# Patient Record
Sex: Male | Born: 1945 | Race: White | Hispanic: No | Marital: Married | Smoking: Former smoker
Health system: Southern US, Community
[De-identification: ages and names within clinical notes are randomized; demographics above are authoritative.]

## PROBLEM LIST (undated history)

## (undated) DIAGNOSIS — R55 Syncope and collapse: Secondary | ICD-10-CM

## (undated) DIAGNOSIS — E119 Type 2 diabetes mellitus without complications: Secondary | ICD-10-CM

## (undated) DIAGNOSIS — I10 Essential (primary) hypertension: Secondary | ICD-10-CM

## (undated) DIAGNOSIS — H269 Unspecified cataract: Secondary | ICD-10-CM

## (undated) DIAGNOSIS — T7840XA Allergy, unspecified, initial encounter: Secondary | ICD-10-CM

## (undated) DIAGNOSIS — I2510 Atherosclerotic heart disease of native coronary artery without angina pectoris: Secondary | ICD-10-CM

## (undated) DIAGNOSIS — Z972 Presence of dental prosthetic device (complete) (partial): Secondary | ICD-10-CM

## (undated) DIAGNOSIS — E785 Hyperlipidemia, unspecified: Secondary | ICD-10-CM

## (undated) HISTORY — PX: CARDIAC CATHETERIZATION: SHX172

## (undated) HISTORY — PX: APPENDECTOMY: SHX54

## (undated) HISTORY — PX: CATARACT EXTRACTION, BILATERAL: SHX1313

## (undated) HISTORY — DX: Unspecified cataract: H26.9

## (undated) HISTORY — DX: Allergy, unspecified, initial encounter: T78.40XA

## (undated) HISTORY — DX: Type 2 diabetes mellitus without complications: E11.9

## (undated) HISTORY — PX: MOHS SURGERY: SUR867

## (undated) HISTORY — DX: Hyperlipidemia, unspecified: E78.5

---

## 2016-09-07 ENCOUNTER — Ambulatory Visit: Payer: Self-pay | Admitting: Unknown Physician Specialty

## 2016-10-19 ENCOUNTER — Encounter: Payer: Self-pay | Admitting: Unknown Physician Specialty

## 2016-10-19 ENCOUNTER — Ambulatory Visit (INDEPENDENT_AMBULATORY_CARE_PROVIDER_SITE_OTHER): Payer: Medicare Other | Admitting: Unknown Physician Specialty

## 2016-10-19 VITALS — BP 130/64 | HR 79 | Temp 98.0°F | Ht 73.0 in | Wt 169.8 lb

## 2016-10-19 DIAGNOSIS — E1169 Type 2 diabetes mellitus with other specified complication: Secondary | ICD-10-CM | POA: Insufficient documentation

## 2016-10-19 DIAGNOSIS — Z7689 Persons encountering health services in other specified circumstances: Secondary | ICD-10-CM

## 2016-10-19 DIAGNOSIS — Z794 Long term (current) use of insulin: Secondary | ICD-10-CM

## 2016-10-19 DIAGNOSIS — I10 Essential (primary) hypertension: Secondary | ICD-10-CM | POA: Diagnosis not present

## 2016-10-19 DIAGNOSIS — E119 Type 2 diabetes mellitus without complications: Secondary | ICD-10-CM

## 2016-10-19 DIAGNOSIS — E1159 Type 2 diabetes mellitus with other circulatory complications: Secondary | ICD-10-CM | POA: Insufficient documentation

## 2016-10-19 DIAGNOSIS — E1165 Type 2 diabetes mellitus with hyperglycemia: Secondary | ICD-10-CM | POA: Insufficient documentation

## 2016-10-19 DIAGNOSIS — I152 Hypertension secondary to endocrine disorders: Secondary | ICD-10-CM | POA: Insufficient documentation

## 2016-10-19 DIAGNOSIS — E7800 Pure hypercholesterolemia, unspecified: Secondary | ICD-10-CM | POA: Diagnosis not present

## 2016-10-19 LAB — LIPID PANEL PICCOLO, WAIVED
Chol/HDL Ratio Piccolo,Waive: 1.8 mg/dL
Cholesterol Piccolo, Waived: 163 mg/dL (ref <200)
HDL CHOL PICCOLO, WAIVED: 90 mg/dL (ref >59)
LDL CHOL CALC PICCOLO WAIVED: 59 mg/dL (ref <100)
TRIGLYCERIDES PICCOLO,WAIVED: 70 mg/dL (ref <150)
VLDL CHOL CALC PICCOLO,WAIVE: 14 mg/dL (ref <30)

## 2016-10-19 LAB — BAYER DCA HB A1C WAIVED: HB A1C (BAYER DCA - WAIVED): 7 % — ABNORMAL HIGH (ref <7.0)

## 2016-10-19 LAB — MICROALBUMIN, URINE WAIVED
CREATININE, URINE WAIVED: 200 mg/dL (ref 10–300)
Microalb, Ur Waived: 80 mg/L — ABNORMAL HIGH (ref 0–19)

## 2016-10-19 MED ORDER — ACCU-CHEK SOFT TOUCH LANCETS MISC: 12 refills | Status: DC

## 2016-10-19 MED ORDER — SIMVASTATIN 80 MG PO TABS: 40.0000 mg | ORAL_TABLET | Freq: Every day | ORAL | 1 refills | Status: DC

## 2016-10-19 MED ORDER — PIOGLITAZONE HCL 30 MG PO TABS: 30.0000 mg | ORAL_TABLET | Freq: Every day | ORAL | 1 refills | Status: DC

## 2016-10-19 MED ORDER — INSULIN LISPRO PROT & LISPRO (50-50 MIX) 100 UNIT/ML KWIKPEN: 6.0000 [IU] | PEN_INJECTOR | Freq: Two times a day (BID) | SUBCUTANEOUS | 12 refills | Status: DC

## 2016-10-19 NOTE — Assessment & Plan Note (Signed)
LDL 59.  Continue current medications

## 2016-10-19 NOTE — Assessment & Plan Note (Addendum)
Hgb A1C is 7.0%

## 2016-10-19 NOTE — Progress Notes (Signed)
BP 130/64   Pulse 79   Temp 98 F (36.7 C)   Ht 6\' 1"  (1.854 m)   Wt 169 lb 12.8 oz (77 kg)   SpO2 94%   BMI 22.40 kg/m    Subjective:    Patient ID: Johnny Wolfe, male    DOB: 05-Mar-1946, 71 y.o.   MRN: [id number]  HPI: Johnny Wolfe is a 71 y.o. male  Chief Complaint  Patient presents with  . Establish Care  . Diabetes   Diabetes: Taking 6 u of 50/50 twice a day.   Using medications without difficulties No hypoglycemic episodes No hyperglycemic episodes Feet problems: none Blood Sugars averaging: checks occasionally.  Sugar was 60 units at night eye exam within last year after cataract surgery Last Hgb A1C: usually 7-8   Hypertension  Using medications without difficulty Average home BPs typically 130/70-60   Using medication without problems or lightheadedness No chest pain with exertion or shortness of breath No Edema  Elevated Cholesterol Using medications without problems No Muscle aches  Diet:Exercise:  Social History   Social History  . Marital status: Married    Spouse name: N/A  . Number of children: N/A  . Years of education: N/A   Occupational History  . Not on file.   Social History Main Topics  . Smoking status: Former Smoker    Quit date: 10/27/2011  . Smokeless tobacco: Never Used  . Alcohol use 4.8 - 8.4 oz/week    2 - 4 Glasses of wine, 6 - 10 Shots of liquor per week  . Drug use: No  . Sexual activity: No   Other Topics Concern  . Not on file   Social History Narrative  . No narrative on file   Family History  Problem Relation Age of Onset  . Diabetes Mother   . Cancer Father        lung   Past Medical History:  Diagnosis Date  . Cataract   . Diabetes mellitus without complication ([place])   . Hyperlipidemia    Past Surgical History:  Procedure Laterality Date  . APPENDECTOMY    . CATARACT EXTRACTION, BILATERAL     August 2017    Relevant past medical, surgical, family and social history reviewed and updated as  indicated. Interim medical history since our last visit reviewed. Allergies and medications reviewed and updated.  Review of Systems  Constitutional: Negative.   HENT: Negative.   Eyes: Negative.   Respiratory: Negative.   Cardiovascular: Negative.   Gastrointestinal: Negative.   Endocrine: Negative.   Genitourinary: Negative.   Skin: Negative.   Allergic/Immunologic: Negative.   Neurological: Negative.   Hematological: Negative.   Psychiatric/Behavioral: Negative.     Per HPI unless specifically indicated above     Objective:    BP 130/64   Pulse 79   Temp 98 F (36.7 C)   Ht 6\' 1"  (1.854 m)   Wt 169 lb 12.8 oz (77 kg)   SpO2 94%   BMI 22.40 kg/m   Wt Readings from Last 3 Encounters:  10/19/16 169 lb 12.8 oz (77 kg)    Physical Exam  Constitutional: He is oriented to person, place, and time. He appears well-developed and well-nourished. No distress.  HENT:  Head: Normocephalic and atraumatic.  Eyes: Conjunctivae and lids are normal. Right eye exhibits no discharge. Left eye exhibits no discharge. No scleral icterus.  Neck: Normal range of motion. Neck supple. No JVD present. Carotid bruit is not present.  Cardiovascular:  Normal rate, regular rhythm and normal heart sounds.   Pulmonary/Chest: Effort normal and breath sounds normal. No respiratory distress.  Abdominal: Normal appearance. There is no splenomegaly or hepatomegaly.  Musculoskeletal: Normal range of motion.  Neurological: He is alert and oriented to person, place, and time.  Skin: Skin is warm, dry and intact. No rash noted. No pallor.  Psychiatric: He has a normal mood and affect. His behavior is normal. Judgment and thought content normal.   Diabetic Foot Exam - Simple   Simple Foot Form Diabetic Foot exam was performed with the following findings:  Yes 10/19/2016  3:49 PM  Visual Inspection No deformities, no ulcerations, no other skin breakdown bilaterally:  Yes Sensation Testing Intact to touch  and monofilament testing bilaterally:  Yes Pulse Check Posterior Tibialis and Dorsalis pulse intact bilaterally:  Yes Comments      No results found for this or any previous visit.    Assessment & Plan:   Problem List Items Addressed This Visit      Unprioritized   Essential hypertension, benign - Primary    Much improved on recheck.  Start Lisinopril 5 mg due to positive microalbumin      Relevant Medications   simvastatin (ZOCOR) 80 MG tablet   Other Relevant Orders   Comprehensive metabolic panel   High cholesterol    LDL 59.  Continue current medications      Relevant Medications   simvastatin (ZOCOR) 80 MG tablet   Other Relevant Orders   Lipid Panel Piccolo, Waived   Type 2 diabetes mellitus without complication (HCC)    Hgb A1C is 7.0%      Relevant Medications   Saxagliptin-Metformin (KOMBIGLYZE XR) 2.08-998 MG TB24   Insulin Lispro Prot & Lispro (HUMALOG 50/50 MIX) (50-50) 100 UNIT/ML Kwikpen   pioglitazone (ACTOS) 30 MG tablet   simvastatin (ZOCOR) 80 MG tablet   Other Relevant Orders   Bayer DCA Hb A1c Waived   Microalbumin, Urine Waived   Comprehensive metabolic panel    Other Visit Diagnoses    Encounter to establish care           Follow up plan: Return in about 3 months (around 01/19/2017).

## 2016-10-19 NOTE — Assessment & Plan Note (Addendum)
Much improved on recheck.  Start Lisinopril 5 mg due to positive microalbumin

## 2016-10-20 LAB — COMPREHENSIVE METABOLIC PANEL
ALK PHOS: 90 IU/L (ref 39–117)
ALT: 7 IU/L (ref 0–44)
AST: 15 IU/L (ref 0–40)
Albumin/Globulin Ratio: 1.8 (ref 1.2–2.2)
Albumin: 4.3 g/dL (ref 3.6–4.8)
BUN/Creatinine Ratio: 18 (ref 10–24)
BUN: 15 mg/dL (ref 8–27)
Bilirubin Total: 0.6 mg/dL (ref 0.0–1.2)
CO2: 24 mmol/L (ref 20–29)
CREATININE: 0.83 mg/dL (ref 0.76–1.27)
Calcium: 9.7 mg/dL (ref 8.6–10.2)
Chloride: 103 mmol/L (ref 96–106)
GFR calc Af Amer: 105 mL/min/{1.73_m2} (ref >59)
GFR calc non Af Amer: 90 mL/min/{1.73_m2} (ref >59)
GLUCOSE: 210 mg/dL — AB (ref 65–99)
Globulin, Total: 2.4 g/dL (ref 1.5–4.5)
Potassium: 4.6 mmol/L (ref 3.5–5.2)
Sodium: 141 mmol/L (ref 134–144)
Total Protein: 6.7 g/dL (ref 6.0–8.5)

## 2016-10-22 ENCOUNTER — Other Ambulatory Visit: Payer: Self-pay | Admitting: Unknown Physician Specialty

## 2016-10-22 MED ORDER — SAXAGLIPTIN-METFORMIN ER 2.5-1000 MG PO TB24: 2.0000 | ORAL_TABLET | Freq: Every day | ORAL | 1 refills | Status: DC

## 2016-10-22 NOTE — Telephone Encounter (Signed)
Routing to provider  

## 2016-10-22 NOTE — Telephone Encounter (Signed)
Patient says all of his medications came through the pharmacy except for the [facility]. Patient would like for Kombligyze to be sent to [facility] again due to an issue with a change in his insurance.   Please Advise.  Thank you

## 2016-10-30 ENCOUNTER — Encounter: Payer: Self-pay | Admitting: Unknown Physician Specialty

## 2016-10-30 ENCOUNTER — Ambulatory Visit (INDEPENDENT_AMBULATORY_CARE_PROVIDER_SITE_OTHER): Payer: Medicare Other | Admitting: Unknown Physician Specialty

## 2016-10-30 VITALS — BP 113/67 | HR 85 | Temp 97.8°F | Ht 73.0 in | Wt 164.2 lb

## 2016-10-30 DIAGNOSIS — M25562 Pain in left knee: Secondary | ICD-10-CM

## 2016-10-30 DIAGNOSIS — Z0000 Encounter for general adult medical examination without abnormal findings: Secondary | ICD-10-CM | POA: Diagnosis not present

## 2016-10-30 MED ORDER — LISINOPRIL 5 MG PO TABS: 5.0000 mg | ORAL_TABLET | Freq: Every day | ORAL | 3 refills | Status: DC

## 2016-10-30 MED ORDER — INSULIN PEN NEEDLE 31G X 4 MM MISC: 1.0000 [IU] | Freq: Two times a day (BID) | 12 refills | Status: DC

## 2016-10-30 NOTE — Progress Notes (Addendum)
BP 113/67   Pulse 85   Temp 97.8 F (36.6 C)   Ht 6\' 1"  (1.854 m)   Wt 164 lb 3.2 oz (74.5 kg)   SpO2 95%   BMI 21.66 kg/m    Subjective:    Patient ID: Johnny Wolfe, male    DOB: 1945-12-07, 71 y.o.   MRN: [id number]  HPI: Johnny Wolfe is a 71 y.o. male  Chief Complaint  Patient presents with  . Medicare Wellness  . Knee Pain    sudden onset left knee pain on 10/25/16, states it is still a little sore    Knee pain Pt with sudden onset of his left knee and difficulty walking.  This resolved after hyper-flexing.    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 ) Does the patient have difficulty seeing, even when wearing glasses/contacts?: No Does the patient have difficulty concentrating, remembering, or making decisions?: No Does the patient have difficulty walking or climbing stairs?: No Does the patient have difficulty dressing or bathing?: No Does the patient have difficulty doing errands alone such as visiting a doctor's office or shopping?: No  Depression screen [facility] 2/9 10/30/2016 10/19/2016  Decreased Interest 0 0  Down, Depressed, Hopeless 0 0  PHQ - 2 Score 0 0  Altered sleeping 0 -  Tired, decreased energy 1 -  Change in appetite 1 -  Feeling bad or failure about yourself  0 -  Trouble concentrating 0 -  Moving slowly or fidgety/restless 0 -  Suicidal thoughts 0 -  PHQ-9 Score 2 -   Fall Risk  10/30/2016  Falls in the past year? No    Social History   Social History  . Marital status: Married    Spouse name: N/A  . Number of children: N/A  . Years of education: N/A   Occupational History  . Not on file.   Social History Main Topics  . Smoking status: Former Smoker    Quit date: 10/27/2011  . Smokeless tobacco: Never Used  . Alcohol use 4.8 - 8.4 oz/week    2 - 4 Glasses of wine, 6 - 10 Shots of liquor per week  . Drug use: No  . Sexual activity: No   Other Topics Concern  . Not on file   Social  History Narrative  . No narrative on file   Past Surgical History:  Procedure Laterality Date  . APPENDECTOMY    . CATARACT EXTRACTION, BILATERAL     August 2017   Past Medical History:  Diagnosis Date  . Cataract   . Diabetes mellitus without complication ([place])   . Hyperlipidemia     Relevant past medical, surgical, family and social history reviewed and updated as indicated. Interim medical history since our last visit reviewed. Allergies and medications reviewed and updated.  Review of Systems  Constitutional: Negative.   HENT: Negative.   Eyes: Negative.   Respiratory: Negative.   Cardiovascular: Negative.   Gastrointestinal: Negative.        Poor appetite  Endocrine: Negative.   Genitourinary: Negative.   Skin: Negative.   Allergic/Immunologic: Negative.   Neurological: Negative.   Hematological: Negative.   Psychiatric/Behavioral: Negative.     Per HPI unless specifically indicated above     Objective:    BP 113/67   Pulse 85   Temp 97.8 F (36.6 C)   Ht 6\' 1"  (1.854 m)   Wt 164 lb 3.2 oz (74.5 kg)  SpO2 95%   BMI 21.66 kg/m   Wt Readings from Last 3 Encounters:  10/30/16 164 lb 3.2 oz (74.5 kg)  10/19/16 169 lb 12.8 oz (77 kg)    Physical Exam  Constitutional: He is oriented to person, place, and time. He appears well-developed and well-nourished.  HENT:  Head: Normocephalic.  Right Ear: Tympanic membrane, external ear and ear canal normal.  Left Ear: Tympanic membrane, external ear and ear canal normal.  Mouth/Throat: Uvula is midline, oropharynx is clear and moist and mucous membranes are normal.  Eyes: Pupils are equal, round, and reactive to light.  Cardiovascular: Normal rate, regular rhythm and normal heart sounds.  Exam reveals no gallop and no friction rub.   No murmur heard. Pulmonary/Chest: Effort normal and breath sounds normal. No respiratory distress.  Abdominal: Soft. Bowel sounds are normal. He exhibits no distension. There is no  tenderness.  Musculoskeletal: Normal range of motion.  Neurological: He is alert and oriented to person, place, and time. He has normal reflexes.  Skin: Skin is warm and dry.  Psychiatric: He has a normal mood and affect. His behavior is normal. Judgment and thought content normal.   EKG Normal sinus rhythm.  IRBBB.  No acute changes  Results for orders placed or performed in visit on 10/19/16  Bayer DCA Hb A1c Waived  Result Value Ref Range   Bayer DCA Hb A1c Waived 7.0 (H) <7.0 %  Lipid Panel Piccolo, Waived  Result Value Ref Range   Cholesterol Piccolo, Waived 163 <200 mg/dL   HDL Chol Piccolo, Waived 90 >59 mg/dL   Triglycerides Piccolo,Waived 70 <150 mg/dL   Chol/HDL Ratio Piccolo,Waive 1.8 mg/dL   LDL Chol Calc Piccolo Waived 59 <100 mg/dL   VLDL Chol Calc Piccolo,Waive 14 <30 mg/dL  Microalbumin, Urine Waived  Result Value Ref Range   Microalb, Ur Waived 80 (H) 0 - 19 mg/L   Creatinine, Urine Waived 200 10 - 300 mg/dL   Microalb/Creat Ratio 30-300 (H) <30 mg/g  Comprehensive metabolic panel  Result Value Ref Range   Glucose 210 (H) 65 - 99 mg/dL   BUN 15 8 - 27 mg/dL   Creatinine, Ser 0.83 0.76 - 1.27 mg/dL   GFR calc non Af Amer 90 >59 mL/min/1.73   GFR calc Af Amer 105 >59 mL/min/1.73   BUN/Creatinine Ratio 18 10 - 24   Sodium 141 134 - 144 mmol/L   Potassium 4.6 3.5 - 5.2 mmol/L   Chloride 103 96 - 106 mmol/L   CO2 24 20 - 29 mmol/L   Calcium 9.7 8.6 - 10.2 mg/dL   Total Protein 6.7 6.0 - 8.5 g/dL   Albumin 4.3 3.6 - 4.8 g/dL   Globulin, Total 2.4 1.5 - 4.5 g/dL   Albumin/Globulin Ratio 1.8 1.2 - 2.2   Bilirubin Total 0.6 0.0 - 1.2 mg/dL   Alkaline Phosphatase 90 39 - 117 IU/L   AST 15 0 - 40 IU/L   ALT 7 0 - 44 IU/L      Assessment & Plan:   Problem List Items Addressed This Visit    None    Visit Diagnoses    Initial Medicare annual wellness visit    -  Primary   Relevant Orders   EKG 12-Lead (Completed)   Acute pain of left knee       this seems  to be resolved.  Continue to monitor       Follow up plan: No Follow-up on file.

## 2016-12-10 ENCOUNTER — Other Ambulatory Visit: Payer: Self-pay | Admitting: Unknown Physician Specialty

## 2017-01-10 ENCOUNTER — Telehealth: Payer: Self-pay | Admitting: Unknown Physician Specialty

## 2017-01-10 NOTE — Telephone Encounter (Signed)
Did not receive a fax from [facility] today and not sure what medications need to be sent in because according to chart all medications should have refills on them. Called and left patient a CM asking for him to please return my call.

## 2017-01-10 NOTE — Telephone Encounter (Signed)
Patient wanted to let Malachy Mood know he will be out of the country on a business trip so he had to cancel his appt for 01-21-17. He said [facility] would be sending a request for some of his medications that he will be out of before he returns some time in November.  Thank You

## 2017-01-15 NOTE — Telephone Encounter (Signed)
Called the home number listed and a lady answered who states that she has been divorced from this person and needs this number taken off of his record. Tried calling the other number listed. It went straight to VM so I left a message asking for patient to please give me a call back when he could to discuss this.

## 2017-01-18 ENCOUNTER — Telehealth: Payer: Self-pay | Admitting: Unknown Physician Specialty

## 2017-01-18 NOTE — Telephone Encounter (Signed)
RX faxed to [facility].  

## 2017-01-18 NOTE — Telephone Encounter (Signed)
Called and left patient another VM. Explained that we have not received refill requests so I am not sure which medications he is needing. Looks like all are not due for refills yet. Asked for patient to please give [place] a call if he needs any refills.

## 2017-01-18 NOTE — Telephone Encounter (Signed)
Called and spoke to the pharmacist at [facility]. She states that they need a new RX for the patient's test strips. I told her that I would be faxing this in because I would have to get the provider's approval. Confirmed fax number with the pharmacist. Will fill out form, have Malachy Mood approve, and fax to [place].

## 2017-01-18 NOTE — Telephone Encounter (Signed)
[facility] pharmacy requesting pre-authorization for one touch ultra blue test strips.  Please Advise.  Thank you  Phone number: [phone] Reference number: [id number]

## 2017-01-21 ENCOUNTER — Ambulatory Visit: Payer: Medicare Other | Admitting: Unknown Physician Specialty

## 2017-02-28 ENCOUNTER — Other Ambulatory Visit: Payer: Self-pay | Admitting: Unknown Physician Specialty

## 2017-03-14 DIAGNOSIS — H43813 Vitreous degeneration, bilateral: Secondary | ICD-10-CM | POA: Diagnosis not present

## 2017-03-14 LAB — HM DIABETES EYE EXAM

## 2017-06-10 ENCOUNTER — Ambulatory Visit: Payer: Medicare Other | Admitting: Unknown Physician Specialty

## 2017-06-10 ENCOUNTER — Encounter: Payer: Self-pay | Admitting: Unknown Physician Specialty

## 2017-06-10 VITALS — BP 148/72 | HR 82 | Temp 97.9°F | Wt 171.6 lb

## 2017-06-10 DIAGNOSIS — E119 Type 2 diabetes mellitus without complications: Secondary | ICD-10-CM | POA: Diagnosis not present

## 2017-06-10 DIAGNOSIS — E7800 Pure hypercholesterolemia, unspecified: Secondary | ICD-10-CM

## 2017-06-10 DIAGNOSIS — R55 Syncope and collapse: Secondary | ICD-10-CM

## 2017-06-10 DIAGNOSIS — I10 Essential (primary) hypertension: Secondary | ICD-10-CM | POA: Diagnosis not present

## 2017-06-10 DIAGNOSIS — Z1159 Encounter for screening for other viral diseases: Secondary | ICD-10-CM | POA: Diagnosis not present

## 2017-06-10 DIAGNOSIS — Z794 Long term (current) use of insulin: Secondary | ICD-10-CM | POA: Diagnosis not present

## 2017-06-10 DIAGNOSIS — Z23 Encounter for immunization: Secondary | ICD-10-CM

## 2017-06-10 DIAGNOSIS — Q181 Preauricular sinus and cyst: Secondary | ICD-10-CM | POA: Diagnosis not present

## 2017-06-10 LAB — BAYER DCA HB A1C WAIVED: HB A1C (BAYER DCA - WAIVED): 6.3 % (ref <7.0)

## 2017-06-10 NOTE — Assessment & Plan Note (Signed)
BP high here but low at home.  Will continue to monitor

## 2017-06-10 NOTE — Patient Instructions (Addendum)
Pneumococcal Polysaccharide Vaccine: What You Need to Know 1. Why get vaccinated? Vaccination can protect older adults (and some children and younger adults) from pneumococcal disease. Pneumococcal disease is caused by bacteria that can spread from person to person through close contact. It can cause ear infections, and it can also lead to more serious infections of the:  Lungs (pneumonia),  Blood (bacteremia), and  Covering of the brain and spinal cord (meningitis). Meningitis can cause deafness and brain damage, and it can be fatal.  Anyone can get pneumococcal disease, but children under 52 years of age, people with certain medical conditions, adults over 104 years of age, and cigarette smokers are at the highest risk. About 18,000 older adults die each year from pneumococcal disease in the [place]. Treatment of pneumococcal infections with penicillin and other drugs used to be more effective. But some strains of the disease have become resistant to these drugs. This makes prevention of the disease, through vaccination, even more important. 2. Pneumococcal polysaccharide vaccine (PPSV23) Pneumococcal polysaccharide vaccine (PPSV23) protects against 23 types of pneumococcal bacteria. It will not prevent all pneumococcal disease. PPSV23 is recommended for:  All adults 31 years of age and older,  Anyone 2 through 70 years of age with certain long-term health problems,  Anyone 2 through 70 years of age with a weakened immune system,  Adults 35 through 70 years of age who smoke cigarettes or have asthma.  Most people need only one dose of PPSV. A second dose is recommended for certain high-risk groups. People 73 and older should get a dose even if they have gotten one or more doses of the vaccine before they turned 65. Your healthcare provider can give you more information about these recommendations. Most healthy adults develop protection within 2 to 3 weeks of getting the shot. 3.  Some people should not get this vaccine  Anyone who has had a life-threatening allergic reaction to PPSV should not get another dose.  Anyone who has a severe allergy to any component of PPSV should not receive it. Tell your provider if you have any severe allergies.  Anyone who is moderately or severely ill when the shot is scheduled may be asked to wait until they recover before getting the vaccine. Someone with a mild illness can usually be vaccinated.  Children less than 64 years of age should not receive this vaccine.  There is no evidence that PPSV is harmful to either a pregnant woman or to her fetus. However, as a precaution, women who need the vaccine should be vaccinated before becoming pregnant, if possible. 4. Risks of a vaccine reaction With any medicine, including vaccines, there is a chance of side effects. These are usually mild and go away on their own, but serious reactions are also possible. About half of people who get PPSV have mild side effects, such as redness or pain where the shot is given, which go away within about two days. Less than 1 out of 100 people develop a fever, muscle aches, or more severe local reactions. Problems that could happen after any vaccine:  People sometimes faint after a medical procedure, including vaccination. Sitting or lying down for about 15 minutes can help prevent fainting, and injuries caused by a fall. Tell your doctor if you feel dizzy, or have vision changes or ringing in the ears.  Some people get severe pain in the shoulder and have difficulty moving the arm where a shot was given. This happens very rarely.  Any  medication can cause a severe allergic reaction. Such reactions from a vaccine are very rare, estimated at about 1 in a million doses, and would happen within a few minutes to a few hours after the vaccination. As with any medicine, there is a very remote chance of a vaccine causing a serious injury or death. The safety of  vaccines is always being monitored. For more information, visit: http://www.aguilar.org/ 5. What if there is a serious reaction? What should I look for? Look for anything that concerns you, such as signs of a severe allergic reaction, very high fever, or unusual behavior. Signs of a severe allergic reaction can include hives, swelling of the face and throat, difficulty breathing, a fast heartbeat, dizziness, and weakness. These would usually start a few minutes to a few hours after the vaccination. What should I do? If you think it is a severe allergic reaction or other emergency that can't wait, call 9-1-1 or get to the nearest hospital. Otherwise, call your doctor. Afterward, the reaction should be reported to the Vaccine Adverse Event Reporting System (VAERS). Your doctor might file this report, or you can do it yourself through the VAERS web site at www.vaers.SamedayNews.es, or by calling 854-166-3662. VAERS does not give medical advice. 6. How can I learn more?  Ask your doctor. He or she can give you the vaccine package insert or suggest other sources of information.  Call your local or state health department.  Contact the Centers for Disease Control and Prevention (CDC): ? Call 8073425804 (1-800-CDC-INFO) or ? Visit CDC's website at http://hunter.com/ CDC Pneumococcal Polysaccharide Vaccine VIS (08/21/13) This information is not intended to replace advice given to you by your health care provider. Make sure you discuss any questions you have with your health care provider. Document Released: 02/11/2006 Document Revised: 01/05/2016 Document Reviewed: 01/05/2016 Elsevier Interactive Patient Education  2017 [place] your long acting insulin on your fasting (usually in the morning) blood sugar.  Increase long acting (daily insulin) 1 units if fasting blood sugar is greater than 130.  Decrease by 2 units if fasting blood sugar is less than 95.

## 2017-06-10 NOTE — Progress Notes (Signed)
BP (!) 148/72   Pulse 82   Temp 97.9 F (36.6 C) (Oral)   Wt 171 lb 9.6 oz (77.8 kg)   SpO2 98%   BMI 22.64 kg/m    Subjective:    Patient ID: Johnny Wolfe, male    DOB: [birth year], 72 y.o.   MRN: [id number]  HPI: Johnny Wolfe is a 72 y.o. male  Chief Complaint  Patient presents with  . Diabetes  . Hyperlipidemia  . Hypertension   Diabetes: Using medications without difficulties: Taking Insulin 50/50 TID 8-8-5 States blood sugar "all over the place."  Normally between 130-150 which is higher.  Blood sugar in the AM 117-174.   Feet problems:none Blood Sugars averaging: eye exam within last year Last Hgb A1C: 7.0  Hypertension  Using medications without difficulty Average home BPs 94-109/58-67  Using medication without problems or lightheadedness No chest pain with exertion or shortness of breath No Edema  Syncope States one day he passed out twice.  States Blood sugar at the time was 69.  Not sure what BP was at this time.  This was early in the AM.    Elevated Cholesterol Using medications without problems No Muscle aches  Diet: Exercise:  Relevant past medical, surgical, family and social history reviewed and updated as indicated. Interim medical history since our last visit reviewed. Allergies and medications reviewed and updated.  Review of Systems  HENT:       Left ear cysts    Per HPI unless specifically indicated above     Objective:    BP (!) 148/72   Pulse 82   Temp 97.9 F (36.6 C) (Oral)   Wt 171 lb 9.6 oz (77.8 kg)   SpO2 98%   BMI 22.64 kg/m   Wt Readings from Last 3 Encounters:  06/10/17 171 lb 9.6 oz (77.8 kg)  10/30/16 164 lb 3.2 oz (74.5 kg)  10/19/16 169 lb 12.8 oz (77 kg)    Physical Exam  Constitutional: He is oriented to person, place, and time. He appears well-developed and well-nourished. No distress.  HENT:  Head: Normocephalic and atraumatic.  Eyes: Conjunctivae and lids are normal. Right eye exhibits no discharge.  Left eye exhibits no discharge. No scleral icterus.  Neck: Normal range of motion. Neck supple. No JVD present. Carotid bruit is not present.  Cardiovascular: Normal rate, regular rhythm and normal heart sounds.  Pulmonary/Chest: Effort normal and breath sounds normal. No respiratory distress.  Abdominal: Normal appearance. There is no splenomegaly or hepatomegaly.  Musculoskeletal: Normal range of motion.  Neurological: He is alert and oriented to person, place, and time.  Skin: Skin is warm, dry and intact. No rash noted. No pallor.  Psychiatric: He has a normal mood and affect. His behavior is normal. Judgment and thought content normal.   EKG RBBB.  NSR.  No acute change Results for orders placed or performed in visit on 06/10/17  Bayer DCA Hb A1c Waived  Result Value Ref Range   Bayer DCA Hb A1c Waived 6.3 <7.0 %      Assessment & Plan:   Problem List Items Addressed This Visit      Unprioritized   Ear cysts   Relevant Orders   Ambulatory referral to ENT   Essential hypertension, benign    BP high here but low at home.  Will continue to monitor      Relevant Orders   Comprehensive metabolic panel   High cholesterol   Relevant Orders   Lipid  Panel w/o Chol/HDL Ratio   Type 2 diabetes mellitus without complication (HCC)    Hgb A1C is 6.3%. However. Having wide swings.  DC 50/50 insulin.  Start Basaglar 10 u QHS. Written instructions to titrate long acting insulin on your fasting (usually in the morning) blood sugar.  Increase long acting (daily insulin) 1 units if fasting blood sugar is greater than 130.  Decrease by 2 units if fasting blood sugar is less than 95.    Take 4 units of Humalog 20 minutes before each meal      Relevant Orders   Bayer DCA Hb A1c Waived (Completed)    Other Visit Diagnoses    Need for pneumococcal vaccination    -  Primary   Relevant Orders   Pneumococcal polysaccharide vaccine 23-valent greater than or equal to 2yo subcutaneous/IM  (Completed)   Syncope, unspecified syncope type       EKG without acute changes.  Refer to cardiology as high risk patient   Relevant Orders   EKG 12-Lead (Completed)   Comprehensive metabolic panel   Ambulatory referral to Cardiology   Need for hepatitis C screening test       Relevant Orders   Hepatitis C antibody       Follow up plan: Return if symptoms worsen or fail to improve.

## 2017-06-10 NOTE — Assessment & Plan Note (Signed)
Hgb A1C is 6.3%. However. Having wide swings.  DC 50/50 insulin.  Start Basaglar 10 u QHS. Written instructions to titrate long acting insulin on your fasting (usually in the morning) blood sugar.  Increase long acting (daily insulin) 1 units if fasting blood sugar is greater than 130.  Decrease by 2 units if fasting blood sugar is less than 95.    Take 4 units of Humalog 20 minutes before each meal

## 2017-06-11 ENCOUNTER — Telehealth: Payer: Self-pay | Admitting: Unknown Physician Specialty

## 2017-06-11 ENCOUNTER — Other Ambulatory Visit
Admission: RE | Admit: 2017-06-11 | Discharge: 2017-06-11 | Disposition: A | Discharge status: Home / Self Care | Payer: Medicare Other | Source: Ambulatory Visit | Attending: Unknown Physician Specialty | Admitting: Unknown Physician Specialty

## 2017-06-11 DIAGNOSIS — E119 Type 2 diabetes mellitus without complications: Secondary | ICD-10-CM | POA: Diagnosis not present

## 2017-06-11 DIAGNOSIS — E875 Hyperkalemia: Secondary | ICD-10-CM

## 2017-06-11 LAB — BASIC METABOLIC PANEL
ANION GAP: 8 (ref 5–15)
BUN: 26 mg/dL — ABNORMAL HIGH (ref 6–20)
CALCIUM: 9.5 mg/dL (ref 8.9–10.3)
CHLORIDE: 104 mmol/L (ref 101–111)
CO2: 24 mmol/L (ref 22–32)
Creatinine, Ser: 0.98 mg/dL (ref 0.61–1.24)
GFR calc non Af Amer: >60 mL/min (ref >60)
Glucose, Bld: 163 mg/dL — ABNORMAL HIGH (ref 65–99)
Potassium: 6.3 mmol/L (ref 3.5–5.1)
SODIUM: 136 mmol/L (ref 135–145)

## 2017-06-11 LAB — LIPID PANEL W/O CHOL/HDL RATIO
Cholesterol, Total: 161 mg/dL (ref 100–199)
HDL: 101 mg/dL (ref >39)
LDL CALC: 51 mg/dL (ref 0–99)
Triglycerides: 43 mg/dL (ref 0–149)
VLDL Cholesterol Cal: 9 mg/dL (ref 5–40)

## 2017-06-11 LAB — COMPREHENSIVE METABOLIC PANEL
ALT: 10 IU/L (ref 0–44)
AST: 18 IU/L (ref 0–40)
Albumin/Globulin Ratio: 1.8 (ref 1.2–2.2)
Albumin: 4.5 g/dL (ref 3.6–4.8)
Alkaline Phosphatase: 83 IU/L (ref 39–117)
BUN/Creatinine Ratio: 24 (ref 10–24)
BUN: 28 mg/dL — AB (ref 8–27)
Bilirubin Total: 0.4 mg/dL (ref 0.0–1.2)
CALCIUM: 10 mg/dL (ref 8.6–10.2)
CHLORIDE: 100 mmol/L (ref 96–106)
CO2: 23 mmol/L (ref 20–29)
Creatinine, Ser: 1.15 mg/dL (ref 0.76–1.27)
GFR calc Af Amer: 75 mL/min/{1.73_m2} (ref >59)
GFR, EST NON AFRICAN AMERICAN: 65 mL/min/{1.73_m2} (ref >59)
GLUCOSE: 116 mg/dL — AB (ref 65–99)
Globulin, Total: 2.5 g/dL (ref 1.5–4.5)
Potassium: 6.8 mmol/L (ref 3.5–5.2)
Sodium: 138 mmol/L (ref 134–144)
Total Protein: 7 g/dL (ref 6.0–8.5)

## 2017-06-11 LAB — HEPATITIS C ANTIBODY: Hep C Virus Ab: <0.1 s/co ratio (ref 0.0–0.9)

## 2017-06-11 MED ORDER — SODIUM POLYSTYRENE SULFONATE 15 GM/60ML PO SUSP: 15.0000 g | Freq: Every day | ORAL | 0 refills | Status: DC

## 2017-06-11 MED ORDER — SODIUM POLYSTYRENE SULFONATE 15 GM/60ML PO SUSP: 30.0000 g | Freq: Every day | ORAL | 0 refills | Status: DC

## 2017-06-11 NOTE — Telephone Encounter (Signed)
Called r/e high potassium.  Needs repeat ASAP.  Order put in for hospital draw through [facility]

## 2017-06-11 NOTE — Telephone Encounter (Signed)
rec'd stat lab per phone call; Potassium level 6.3.    Phone call to Kathrine Haddock, NP; advised of results of K+ of 6.3.  Verb. understanding.  Will also send to Flow Coordinator.   Agreed with plan.

## 2017-06-11 NOTE — Telephone Encounter (Signed)
Routing to provider  

## 2017-06-11 NOTE — Telephone Encounter (Signed)
Talked to patients.  DC'd Lisinopril.  Kayexalate for 3 days.  Recheck K on Friday.  Order written

## 2017-06-11 NOTE — Telephone Encounter (Signed)
Patient returned my call and was notified about lab. Patient was advised to please go by the hospital, medical mall entrance, and have lab repeated ASAP. Patient verbalized understanding.

## 2017-06-11 NOTE — Telephone Encounter (Signed)
Discussed with pt high Potassium.  Stop Lisinopril (K was normal before starting).  Start Kay exylate daily for 3 days.  Recheck K on Friday.  Discussed pt with Dr Wynetta Emery.

## 2017-06-11 NOTE — Telephone Encounter (Signed)
Copied from [place] [phone]. Topic: Quick Communication - See Telephone Encounter >> Jun 11, 2017  1:38 PM Ether Griffins B wrote: CRM for notification. See Telephone encounter for:  Pharmacy states they didn't receive the request on the sodium polystyrene. [facility]. Pt is at pharmacy.  06/11/17.

## 2017-06-11 NOTE — Telephone Encounter (Signed)
Called patient and confirmed that medication was picked up.

## 2017-06-11 NOTE — Telephone Encounter (Signed)
Called and left patient a VM asking for him to please return my call as soon as he got my message.

## 2017-06-11 NOTE — Telephone Encounter (Signed)
Copied from [place] [phone]. Topic: Quick Communication - See Telephone Encounter >> Jun 11, 2017  1:38 PM Ether Griffins B wrote: CRM for notification. See Telephone encounter for:   06/11/17.

## 2017-06-11 NOTE — Telephone Encounter (Signed)
PT states the pharmacy told him  Corky Downs  is on back order, he is asking if there is something else  She can prescribe

## 2017-06-14 ENCOUNTER — Other Ambulatory Visit
Admission: RE | Admit: 2017-06-14 | Discharge: 2017-06-14 | Disposition: A | Discharge status: Home / Self Care | Payer: Medicare Other | Source: Ambulatory Visit | Attending: Unknown Physician Specialty | Admitting: Unknown Physician Specialty

## 2017-06-14 ENCOUNTER — Telehealth: Payer: Self-pay | Admitting: Unknown Physician Specialty

## 2017-06-14 DIAGNOSIS — E875 Hyperkalemia: Secondary | ICD-10-CM | POA: Insufficient documentation

## 2017-06-14 LAB — BASIC METABOLIC PANEL
ANION GAP: 7 (ref 5–15)
BUN: 21 mg/dL — ABNORMAL HIGH (ref 6–20)
CALCIUM: 9.3 mg/dL (ref 8.9–10.3)
CHLORIDE: 106 mmol/L (ref 101–111)
CO2: 25 mmol/L (ref 22–32)
Creatinine, Ser: 0.9 mg/dL (ref 0.61–1.24)
GFR calc Af Amer: >60 mL/min (ref >60)
GFR calc non Af Amer: >60 mL/min (ref >60)
GLUCOSE: 151 mg/dL — AB (ref 65–99)
POTASSIUM: 4.7 mmol/L (ref 3.5–5.1)
Sodium: 138 mmol/L (ref 135–145)

## 2017-06-14 NOTE — Telephone Encounter (Signed)
Discussed with pt K is normal.  OK to stop Kayexylate

## 2017-06-21 ENCOUNTER — Other Ambulatory Visit: Payer: Self-pay | Admitting: Unknown Physician Specialty

## 2017-06-25 DIAGNOSIS — L723 Sebaceous cyst: Secondary | ICD-10-CM | POA: Diagnosis not present

## 2017-06-27 ENCOUNTER — Encounter: Payer: Self-pay | Admitting: Anesthesiology

## 2017-06-27 ENCOUNTER — Encounter: Payer: Self-pay | Admitting: *Deleted

## 2017-06-27 ENCOUNTER — Other Ambulatory Visit: Payer: Self-pay

## 2017-07-02 ENCOUNTER — Telehealth: Payer: Self-pay | Admitting: Unknown Physician Specialty

## 2017-07-02 NOTE — Telephone Encounter (Signed)
Called and spoke to patient. He states he needs a sample of the basaglar and humalog 100. Samples signed out and patient stated he would be by tomorrow to pick them up.

## 2017-07-02 NOTE — Telephone Encounter (Signed)
Copied from [place] [phone]. Topic: Inquiry >> Jul 02, 2017 10:58 AM Conception Chancy, NT wrote: Patient is calling and states when he was seen by Kathrine Haddock 06/10/17. He states he was given samples of insulin pins and it was supposed to last him until his appt on 07/09/17. Patient states he does not have enough to last him so would like to know if he needs to come in and get more samples or if a script needs to be called to his pharmacy. Patient states the Hemalog works well. He states his insurance does not Scientist, physiological but will cover Lantus Solostar. Please contact patient.   [id number] [address], [place] [address] Phone: [phone] Fax: [phone]

## 2017-07-04 ENCOUNTER — Ambulatory Visit
Admission: RE | Admit: 2017-07-04 | Discharge: 2017-07-04 | Disposition: A | Discharge status: Home / Self Care | Payer: Medicare Other | Source: Ambulatory Visit | Attending: Unknown Physician Specialty | Admitting: Unknown Physician Specialty

## 2017-07-04 ENCOUNTER — Encounter
Admission: RE | Disposition: A | Discharge status: Home / Self Care | Payer: Self-pay | Source: Ambulatory Visit | Attending: Unknown Physician Specialty

## 2017-07-04 DIAGNOSIS — I10 Essential (primary) hypertension: Secondary | ICD-10-CM | POA: Insufficient documentation

## 2017-07-04 DIAGNOSIS — Z79899 Other long term (current) drug therapy: Secondary | ICD-10-CM | POA: Diagnosis not present

## 2017-07-04 DIAGNOSIS — L720 Epidermal cyst: Secondary | ICD-10-CM | POA: Diagnosis present

## 2017-07-04 DIAGNOSIS — Z87891 Personal history of nicotine dependence: Secondary | ICD-10-CM | POA: Diagnosis not present

## 2017-07-04 DIAGNOSIS — E119 Type 2 diabetes mellitus without complications: Secondary | ICD-10-CM | POA: Diagnosis not present

## 2017-07-04 DIAGNOSIS — E669 Obesity, unspecified: Secondary | ICD-10-CM | POA: Diagnosis not present

## 2017-07-04 DIAGNOSIS — L723 Sebaceous cyst: Secondary | ICD-10-CM | POA: Insufficient documentation

## 2017-07-04 DIAGNOSIS — E785 Hyperlipidemia, unspecified: Secondary | ICD-10-CM | POA: Diagnosis not present

## 2017-07-04 DIAGNOSIS — Z794 Long term (current) use of insulin: Secondary | ICD-10-CM | POA: Insufficient documentation

## 2017-07-04 DIAGNOSIS — Z833 Family history of diabetes mellitus: Secondary | ICD-10-CM | POA: Insufficient documentation

## 2017-07-04 DIAGNOSIS — E7800 Pure hypercholesterolemia, unspecified: Secondary | ICD-10-CM | POA: Insufficient documentation

## 2017-07-04 HISTORY — PX: MASS EXCISION: SHX2000

## 2017-07-04 HISTORY — DX: Presence of dental prosthetic device (complete) (partial): Z97.2

## 2017-07-04 LAB — GLUCOSE, CAPILLARY
Glucose-Capillary: 127 mg/dL — ABNORMAL HIGH (ref 65–99)
Glucose-Capillary: 114 mg/dL — ABNORMAL HIGH (ref 65–99)

## 2017-07-04 SURGERY — EXCISION MASS
Anesthesia: General | Site: Ear | Laterality: Left | Wound class: Clean

## 2017-07-04 MED ORDER — LIDOCAINE-EPINEPHRINE 1 %-1:100000 IJ SOLN
INTRAMUSCULAR | Status: DC | PRN
  Administered 2017-07-04: 1 mL

## 2017-07-04 MED ORDER — BACITRACIN-NEOMYCIN-POLYMYXIN OINTMENT TUBE
TOPICAL_OINTMENT | CUTANEOUS | Status: DC | PRN
  Administered 2017-07-04: 1 via TOPICAL

## 2017-07-04 SURGICAL SUPPLY — 33 items
APPLICATOR COTTON TIP WD 3 STR (MISCELLANEOUS) IMPLANT
BLADE SURG 15 STRL LF DISP TIS (BLADE) IMPLANT
BLADE SURG 15 STRL SS (BLADE)
CANISTER SUCT 1200ML W/VALVE (MISCELLANEOUS) IMPLANT
CORD BIP STRL DISP 12FT (MISCELLANEOUS) IMPLANT
DRAPE HEAD BAR (DRAPES) IMPLANT
DRAPE SURG 17X11 SM STRL (DRAPES) ×2 IMPLANT
DRESSING TELFA 4X3 1S ST N-ADH (GAUZE/BANDAGES/DRESSINGS) IMPLANT
ELECT CAUTERY BLADE TIP 2.5 (TIP)
ELECT CAUTERY NEEDLE 2.0 MIC (NEEDLE) IMPLANT
ELECT REM PT RETURN 9FT ADLT (ELECTROSURGICAL)
ELECTRODE CAUTERY BLDE TIP 2.5 (TIP) IMPLANT
ELECTRODE REM PT RTRN 9FT ADLT (ELECTROSURGICAL) IMPLANT
GAUZE SPONGE 4X4 12PLY STRL (GAUZE/BANDAGES/DRESSINGS) IMPLANT
GLOVE BIO SURGEON STRL SZ7.5 (GLOVE) ×4 IMPLANT
KIT TURNOVER KIT A (KITS) ×2 IMPLANT
LIQUID BAND (GAUZE/BANDAGES/DRESSINGS) IMPLANT
NEEDLE HYPO 25GX1X1/2 BEV (NEEDLE) ×2 IMPLANT
NS IRRIG 500ML POUR BTL (IV SOLUTION) ×4 IMPLANT
PACK DRAPE NASAL/ENT (PACKS) ×2 IMPLANT
PENCIL ELECTRO HAND CTR (MISCELLANEOUS) IMPLANT
SOL PREP PVP 2OZ (MISCELLANEOUS) ×2
SOLUTION PREP PVP 2OZ (MISCELLANEOUS) ×1 IMPLANT
STRAP BODY AND KNEE 60X3 (MISCELLANEOUS) ×2 IMPLANT
SUCTION FRAZIER TIP 10 FR DISP (SUCTIONS) IMPLANT
SUT PROLENE 5 0 P 3 (SUTURE) IMPLANT
SUT PROLENE 5 0 PS 3 (SUTURE) ×4 IMPLANT
SUT VIC AB 4-0 RB1 27 (SUTURE)
SUT VIC AB 4-0 RB1 27X BRD (SUTURE) IMPLANT
SUT VIC AB 5-0 P-3 18X BRD (SUTURE) ×1 IMPLANT
SUT VIC AB 5-0 P3 18 (SUTURE) ×1
SYRINGE 10CC LL (SYRINGE) ×2 IMPLANT
TOWEL OR 17X26 4PK STRL BLUE (TOWEL DISPOSABLE) ×2 IMPLANT

## 2017-07-04 NOTE — OR Nursing (Signed)
1113  SpO2  96% HR   73 BP  125/77 RR  18  1119  SpO2   96% HR  77 BP  128/64 RR  18  1124  SpO2  97% HR  75 BP  114/65 RR  18  1129  SpO2  96% HR  74 BP  118/63 RR  16  1134  SpO2  96% HR  73 BP  114/66 RR  18

## 2017-07-04 NOTE — Op Note (Signed)
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 He is placed in supine position. The left ear was prepped and draped sterilely a local anesthetic 1% lidocaine with 1 100,000 units of epinephrine was used to inject the ear lobe of postauricular crease. A total of 1/2 cc was used. There was then gently folded forward and draped sterilely. Beginning with the intra earlobe cyst an incision was made on the posterior aspect of the earlobe short sharp scissors were used to dissect down to the cyst wall. The cyst was dissected circumferentially and was stuck to the skin of the anterior portion of the earlobe presumably the area at which the cyst originated. A small cuff of the anterior aspects skin was excised with the cyst. This was sent for permanent section. A 5-0 Vicryl was used to close the deep layers of the earlobe and a 5-0 Prolene was used to close the anterior skin in interrupted fashion the same 5-0 was used to close the postauricular skin in interrupted fashion. With the intra-earlobe cyst removed attention was then turned to the postauricular cyst. Elliptical incision was made around the cyst using a 15 blade and was excised using a short sharp scissor. This incision was closed using interrupted 4-0 Prolene. Patient was then taken to the recovery room in stable condition.  Dispo:   Good  Plan:  Discharge to home he will apply antibiotic ointment once a day.  Roena Malady  07/04/2017 11:39 AM

## 2017-07-04 NOTE — H&P (Signed)
The patient's history has been reviewed, patient examined, no change in status, stable for surgery.  Questions were answered to the patients satisfaction.  

## 2017-07-05 ENCOUNTER — Encounter: Payer: Self-pay | Admitting: Unknown Physician Specialty

## 2017-07-08 LAB — SURGICAL PATHOLOGY

## 2017-07-09 ENCOUNTER — Ambulatory Visit: Payer: Medicare Other | Admitting: Unknown Physician Specialty

## 2017-07-15 DIAGNOSIS — R55 Syncope and collapse: Secondary | ICD-10-CM | POA: Insufficient documentation

## 2017-07-15 NOTE — Progress Notes (Signed)
Cardiology Office Note  Date:  07/17/2017   ID:  Johnny Wolfe, DOB May 14, 1945, MRN [id number]  PCP:  Johnny Haddock, NP   Chief Complaint  Patient presents with  . other    Ref by Johnny Haddock, NP for syncope. Meds reviewed by the pt. verbally. Pt. c/o decreased blood pressure with having several episodes of syncope.    HPI:  Johnny Wolfe is a 72 old gentleman with past medical history of Diabetes, Hemoglobin A1c 6.3 Hypertension Hyperlipidemia Smoker quit 2013 ETOH presenting by referral from Johnny Wolfe for consultation of his syncope  Cough  Hyperkalemia  06/10/2017   6.8 Seen BY PMD on 2/11, reported syncope EKG sinus rhythm rate normal, no change  kayexalate on back order Stopped lisinopril Recheck potassium in 4 range Given sodium polystyrene. Potassium normal 2/15  Total chol 161, LDL 51 HBA1C 6.3  Syncope: 05/21/2017 On [employer] to bed 7pm Woke up on the floor Remembered went to bathroom, woke up on floor, dark in room Then went to bathroom again,  Feeling for light Woke up again in the bathtub, little knot on head, Got up out of bathroom,  Little dizzy,  Sugars were 80  Between 1/22 and 2/11 Was on BP medication, lisinopril 5 BP 88/50, running low consistently before lisinopril was stopped 2/12  Long discussion concerning his hydration Certainly possible he could be dehydrated at times, does not drink very much  Orthostatics done in the office  Supine  systolic pressure 585/27 heart rate 85 Sitting 138/71 rate 85 Standing 118/68 rate 98 Standing after 3 minutes 117/75 rate 105  Insulin 50/50 was discontinued, felt to be having wide swings  Father died 70s cancer Mother Obese, DM, cad, stent age 2, now age 70, now pacer Brother CAD, PCi  EKG personally reviewed by myself on todays visit Shows normal sinus rhythm rate 92 bpm right bundle branch block rare PVCs rhythm strip obtained with Rare PVC   PMH:   has a past medical  history of Cataract, Diabetes mellitus without complication ([place]), Hyperlipidemia, and Wears dentures.  PSH:    Past Surgical History:  Procedure Laterality Date  . APPENDECTOMY    . CATARACT EXTRACTION, BILATERAL     August 2017  . MASS EXCISION Left 07/04/2017   Procedure: REMOVAL TWO CYST EAR EAR LOBE AND EXTERNAL EAR;  Surgeon: Johnny Gust, MD;  Location: [facility];  Service: ENT;  Laterality: Left;  LOCAL Diabetic - insulin and oral meds    Current Outpatient Medications  Medication Sig Dispense Refill  . chlorpheniramine-HYDROcodone (TUSSIONEX PENNKINETIC ER) 10-8 MG/5ML SUER Take 5 mLs by mouth every 12 (twelve) hours as needed for cough. 115 mL 0  . glucose blood test strip 1 each by Other route as needed for other. One Touch Ultra    . Insulin Glargine (LANTUS SOLOSTAR) 100 UNIT/ML Solostar Pen Inject 20 Units into the skin daily at 10 pm. 5 pen PRN  . insulin lispro (HUMALOG) 100 UNIT/ML KiwkPen Inject 0.04 mLs (4 Units total) into the skin 3 (three) times daily. 5 pen 11  . Insulin Pen Needle 31G X 4 MM MISC 1 Units by Does not apply route 2 (two) times daily. 100 each 12  . KOMBIGLYZE XR 2.08-998 MG TB24 TAKE 2 TABLETS BY MOUTH  DAILY 180 tablet 1  . ONETOUCH DELICA LANCETS FINE MISC Use TID 100 each 12  . pioglitazone (ACTOS) 30 MG tablet TAKE 1 TABLET BY MOUTH  DAILY 90 tablet 1  .  simvastatin (ZOCOR) 80 MG tablet Take 0.5 tablets (40 mg total) by mouth daily. 90 tablet 1   No current facility-administered medications for this visit.      Allergies:   Neosporin [neomycin-bacitracin zn-polymyx]   Social History:  The patient  reports that he quit smoking about 5 years ago. he has never used smokeless tobacco. He reports that he drinks about 4.8 - 8.4 oz of alcohol per week. He reports that he does not use drugs.   Family History:   family history includes Arrhythmia (age of onset: 64) in his mother; Cancer in his father; Diabetes in his mother.    Review  of Systems: Review of Systems  Constitutional: Negative.   Respiratory: Negative.   Cardiovascular: Negative.   Gastrointestinal: Negative.   Musculoskeletal: Negative.   Neurological: Negative.   Psychiatric/Behavioral: Negative.   All other systems reviewed and are negative.    PHYSICAL EXAM: VS:  BP (!) 142/80 (BP Location: Right Arm, Patient Position: Sitting, Cuff Size: Normal)   Pulse 92   Ht 6' (1.829 m)   Wt 168 lb 8 oz (76.4 kg)   BMI 22.85 kg/m  , BMI Body mass index is 22.85 kg/m. GEN: Well nourished, well developed, in no acute distress  HEENT: normal  Neck: no JVD, carotid bruits, or masses Cardiac: RRR; no murmurs, rubs, or gallops,no edema  Respiratory:  clear to auscultation bilaterally, normal work of breathing GI: soft, nontender, nondistended, + BS MS: no deformity or atrophy  Skin: warm and dry, no rash Neuro:  Strength and sensation are intact Psych: euthymic mood, full affect    Recent Labs: 06/10/2017: ALT 10 06/14/2017: BUN 21; Creatinine, Ser 0.90; Potassium 4.7; Sodium 138    Lipid Panel Lab Results  Component Value Date   CHOL 161 06/10/2017   HDL 101 06/10/2017   LDLCALC 51 06/10/2017   TRIG 43 06/10/2017      Wt Readings from Last 3 Encounters:  07/17/17 168 lb 8 oz (76.4 kg)  07/16/17 169 lb 6.4 oz (76.8 kg)  07/04/17 167 lb (75.8 kg)       ASSESSMENT AND PLAN:  Type 2 diabetes mellitus without complication, with long-term current use of insulin ([place]) Managed by primary care, recent changes to his insulin for wide swings Suspect unrelated to recent episodes of syncope  Syncope and collapse - Plan: EKG 12-Lead, CT CARDIAC SCORING Long discussion with him, orthostatic on today's visit He documented chronically low blood pressures typically in the 80s in a sitting position after his episodes of syncope.  Since holding the lisinopril blood pressures have improved Unable to exclude mild dehydration when he was traveling January  22 Unclear if hyperkalemia also played a role, this can sometimes lead to arrhythmia but he denies any tachycardia or palpitations only lightheadedness when standing Relatively benign clinical exam,  EKG relatively benign No symptoms concerning for angina He is interested in risk stratification given strong family history CT coronary calcium score has been ordered for guidance of his lipids and further testing -For now I have recommended he increase his hydration given positive orthostatics on today's visit -He will call for any episodes of syncope  Essential hypertension, benign Borderline  elevated in supine position but drops with standing Recommended he hydrate, avoid blood pressure medications at this time  High cholesterol - Plan: CT CARDIAC SCORING Currently on a statin CT coronary calcium scoring for risk stratification  Hyperkalemia Presumed to be second to lisinopril Was only on very low-dose 5  mg Will avoid ACE inhibitors ARB  Disposition:   F/U as needed   Total encounter time more than 60 minutes  Greater than 50% was spent in counseling and coordination of care with the patient    Orders Placed This Encounter  Procedures  . CT CARDIAC SCORING  . EKG 12-Lead     Signed, Esmond Plants, M.D., Ph.D. 07/17/2017  [place], [place]

## 2017-07-16 ENCOUNTER — Ambulatory Visit: Payer: Medicare Other | Admitting: Unknown Physician Specialty

## 2017-07-16 ENCOUNTER — Encounter: Payer: Self-pay | Admitting: Unknown Physician Specialty

## 2017-07-16 VITALS — BP 158/73 | HR 82 | Temp 98.1°F | Ht 73.0 in | Wt 169.4 lb

## 2017-07-16 DIAGNOSIS — E119 Type 2 diabetes mellitus without complications: Secondary | ICD-10-CM | POA: Diagnosis not present

## 2017-07-16 DIAGNOSIS — R05 Cough: Secondary | ICD-10-CM | POA: Diagnosis not present

## 2017-07-16 DIAGNOSIS — Z794 Long term (current) use of insulin: Secondary | ICD-10-CM

## 2017-07-16 DIAGNOSIS — R059 Cough, unspecified: Secondary | ICD-10-CM

## 2017-07-16 MED ORDER — INSULIN LISPRO 100 UNIT/ML (KWIKPEN): 4.0000 [IU] | PEN_INJECTOR | Freq: Three times a day (TID) | SUBCUTANEOUS | 11 refills | Status: DC

## 2017-07-16 MED ORDER — ONETOUCH DELICA LANCETS FINE MISC: 12 refills | Status: DC

## 2017-07-16 MED ORDER — HYDROCOD POLST-CPM POLST ER 10-8 MG/5ML PO SUER: 5.0000 mL | Freq: Two times a day (BID) | ORAL | 0 refills | Status: DC | PRN

## 2017-07-16 MED ORDER — INSULIN GLARGINE 100 UNIT/ML SOLOSTAR PEN: 20.0000 [IU] | PEN_INJECTOR | Freq: Every day | SUBCUTANEOUS | 99 refills | Status: DC

## 2017-07-16 NOTE — Progress Notes (Signed)
BP (!) 158/73 (BP Location: Left Arm, Cuff Size: Normal)   Pulse 82   Temp 98.1 F (36.7 C) (Oral)   Ht 6\' 1"  (1.854 m)   Wt 169 lb 6.4 oz (76.8 kg)   SpO2 98%   BMI 22.35 kg/m    Subjective:    Patient ID: Johnny Wolfe, male    DOB: [birth year], 72 y.o.   MRN: [id number]  HPI: Johnny Wolfe is a 72 y.o. male  Chief Complaint  Patient presents with  . Diabetes    4 week f/up   Diabetes Last visit, changed his insulin from 50/50 to Basaglar and Humalog with meals.  We started him on Basaglar 10 units and titrated up to 14 units.  Taking 4 units of insulin with meals.  Feels he would like to make adjustments but wide swings seems to be improved  Cough  This is a new (Wants something for cough at night while recovering from a cold) problem. Episode onset: 3 weeks ago. The problem has been gradually improving. The cough is non-productive. Pertinent negatives include no chest pain, chills, ear congestion, ear pain, fever, headaches, heartburn, hemoptysis, nasal congestion, postnasal drip, sore throat, shortness of breath, weight loss or wheezing. The symptoms are aggravated by lying down. He has tried OTC cough suppressant for the symptoms. The treatment provided mild relief.   Social History   Socioeconomic History  . Marital status: Married    Spouse name: Not on file  . Number of children: Not on file  . Years of education: Not on file  . Highest education level: Not on file  Social Needs  . Financial resource strain: Not on file  . Food insecurity - worry: Not on file  . Food insecurity - inability: Not on file  . Transportation needs - medical: Not on file  . Transportation needs - non-medical: Not on file  Occupational History  . Not on file  Tobacco Use  . Smoking status: Former Smoker    Last attempt to quit: 10/27/2011    Years since quitting: 5.7  . Smokeless tobacco: Never Used  Substance and Sexual Activity  . Alcohol use: Yes    Alcohol/week: 4.8 - 8.4 oz   Types: 2 - 4 Glasses of wine, 6 - 10 Shots of liquor per week  . Drug use: No  . Sexual activity: No  Other Topics Concern  . Not on file  Social History Narrative  . Not on file     Relevant past medical, surgical, family and social history reviewed and updated as indicated. Interim medical history since our last visit reviewed. Allergies and medications reviewed and updated.  Review of Systems  Constitutional: Negative for chills, fever and weight loss.  HENT: Negative for ear pain, postnasal drip and sore throat.   Respiratory: Positive for cough. Negative for hemoptysis, shortness of breath and wheezing.   Cardiovascular: Negative for chest pain.  Gastrointestinal: Negative for heartburn.  Neurological: Negative for headaches.    Per HPI unless specifically indicated above     Objective:    BP (!) 158/73 (BP Location: Left Arm, Cuff Size: Normal)   Pulse 82   Temp 98.1 F (36.7 C) (Oral)   Ht 6\' 1"  (1.854 m)   Wt 169 lb 6.4 oz (76.8 kg)   SpO2 98%   BMI 22.35 kg/m   Wt Readings from Last 3 Encounters:  07/16/17 169 lb 6.4 oz (76.8 kg)  07/04/17 167 lb (75.8 kg)  06/10/17 171  lb 9.6 oz (77.8 kg)    Physical Exam  Constitutional: He is oriented to person, place, and time. He appears well-developed and well-nourished. No distress.  HENT:  Head: Normocephalic and atraumatic.  Eyes: Conjunctivae and lids are normal. Right eye exhibits no discharge. Left eye exhibits no discharge. No scleral icterus.  Neck: Normal range of motion. Neck supple. No JVD present. Carotid bruit is not present.  Cardiovascular: Normal rate, regular rhythm and normal heart sounds.  Pulmonary/Chest: Effort normal and breath sounds normal. No respiratory distress.  Abdominal: Normal appearance. There is no splenomegaly or hepatomegaly.  Musculoskeletal: Normal range of motion.  Neurological: He is alert and oriented to person, place, and time.  Skin: Skin is warm, dry and intact. No rash  noted. No pallor.  Psychiatric: He has a normal mood and affect. His behavior is normal. Judgment and thought content normal.      Assessment & Plan:   Problem List Items Addressed This Visit      Unprioritized   Type 2 diabetes mellitus without complication ([place])    Continue present treatment with 24 hour and immediate acting insulin.  OK to adjust doses as he keeps close watch on his numbers.        Relevant Medications   Insulin Glargine (LANTUS SOLOSTAR) 100 UNIT/ML Solostar Pen   insulin lispro (HUMALOG) 100 UNIT/ML KiwkPen    Other Visit Diagnoses    Cough    -  Primary   Post viral cough.  Rx for Tussionex for night time use       Follow up plan: Return in about 2 months (around 09/15/2017).

## 2017-07-16 NOTE — Assessment & Plan Note (Signed)
Continue present treatment with 24 hour and immediate acting insulin.  OK to adjust doses as he keeps close watch on his numbers.

## 2017-07-17 ENCOUNTER — Ambulatory Visit: Payer: Medicare Other | Admitting: Cardiovascular Disease

## 2017-07-17 ENCOUNTER — Encounter: Payer: Self-pay | Admitting: Cardiovascular Disease

## 2017-07-17 VITALS — BP 142/80 | HR 92 | Ht 72.0 in | Wt 168.5 lb

## 2017-07-17 DIAGNOSIS — E875 Hyperkalemia: Secondary | ICD-10-CM

## 2017-07-17 DIAGNOSIS — E119 Type 2 diabetes mellitus without complications: Secondary | ICD-10-CM

## 2017-07-17 DIAGNOSIS — Z794 Long term (current) use of insulin: Secondary | ICD-10-CM

## 2017-07-17 DIAGNOSIS — I10 Essential (primary) hypertension: Secondary | ICD-10-CM

## 2017-07-17 DIAGNOSIS — E7800 Pure hypercholesterolemia, unspecified: Secondary | ICD-10-CM

## 2017-07-17 DIAGNOSIS — R55 Syncope and collapse: Secondary | ICD-10-CM | POA: Diagnosis not present

## 2017-07-17 NOTE — Patient Instructions (Addendum)
hydrate  Medication Instructions:   No medication changes made  Labwork:  No new labs needed  Testing/Procedures:  We will order CT coronary calcium score $150    Follow-Up: It was a pleasure seeing you in the office today. Please call us if you have new issues that need to be addressed before your next appt.  [phone]  Your physician wants you to follow-up in: as needed  If you need a refill on your cardiac medications before your next appointment, please call your pharmacy.  For educational health videos Log in to : [email or web address] Or : [email or web address], password : [email or web address]

## 2017-07-25 ENCOUNTER — Ambulatory Visit (INDEPENDENT_AMBULATORY_CARE_PROVIDER_SITE_OTHER)
Admission: RE | Admit: 2017-07-25 | Discharge: 2017-07-25 | Disposition: A | Discharge status: Home / Self Care | Payer: Self-pay | Source: Ambulatory Visit | Attending: Cardiovascular Disease | Admitting: Cardiovascular Disease

## 2017-07-25 DIAGNOSIS — R55 Syncope and collapse: Secondary | ICD-10-CM

## 2017-07-25 DIAGNOSIS — E7800 Pure hypercholesterolemia, unspecified: Secondary | ICD-10-CM

## 2017-07-30 ENCOUNTER — Telehealth: Payer: Self-pay | Admitting: Cardiovascular Disease

## 2017-07-30 NOTE — Telephone Encounter (Signed)
Pt returning our call about test results  Please call back

## 2017-07-30 NOTE — Telephone Encounter (Signed)
Reviewed results and recommendations with patient and scheduled him to come in on Thursday to see Dr. Rockey Situ to discuss options. He was appreciative for the call with no further questions at this time.

## 2017-07-31 DIAGNOSIS — I2510 Atherosclerotic heart disease of native coronary artery without angina pectoris: Secondary | ICD-10-CM | POA: Insufficient documentation

## 2017-07-31 NOTE — Progress Notes (Signed)
Cardiology Office Note  Date:  08/01/2017   ID:  Asah Lamay, DOB 02/02/1946, MRN [id number]  PCP:  Kathrine Haddock, NP   Chief Complaint  Patient presents with  . other    Follow up from CT calcium score. Meds reviewed by the pt. verbally. "doing well." Pt. c/o getting tired easily.     HPI:  Mr. Thompson Mckim is a 72 old gentleman with past medical history of Diabetes, Hemoglobin A1c 6.3 Hypertension Hyperlipidemia Smoker quit 2013 ETOH Presenting for follow-up of his syncope and CT coronary calcium scoring  Coronary calcium score of 2310. This was 95 percentile for 72 and sex matched control. high calcium score.  Long discussion with him concerning details as above CT scan images pulled up with him in detail showing severe heavy three-vessel disease Very mild aortic atherosclerosis  Discussed previous episodes of syncope Prior history of hyperlipidemia Hypertension on lisinopril, also hyperkalemia  Other past medical history reviewed Total chol 161, LDL 51 HBA1C 6.3  Syncope: 05/21/2017 On [employer] to bed 7pm Woke up on the floor Remembered went to bathroom, woke up on floor, dark in room Then went to bathroom again,  Feeling for light Woke up again in the bathtub, little knot on head, Got up out of bathroom,  Little dizzy,  Sugars were 80  Between 1/22 and 2/11 Was on BP medication, lisinopril 5 BP 88/50, running low consistently before lisinopril was stopped 2/12  Long discussion concerning his hydration Certainly possible he could be dehydrated at times, does not drink very much  Orthostatics done in the office  Supine  systolic pressure 607/37 heart rate 85 Sitting 138/71 rate 85 Standing 118/68 rate 98 Standing after 3 minutes 117/75 rate 105  Insulin 50/50 was discontinued, felt to be having wide swings  Father died 91s cancer Mother Obese, DM, cad, stent age 41, now age 38, now pacer Brother CAD, PCi  Previous EKG with  normal sinus  rhythm rate 92 bpm right bundle branch block rare     PMH:   has a past medical history of Cataract, Diabetes mellitus without complication ([place]), Hyperlipidemia, and Wears dentures.  PSH:    Past Surgical History:  Procedure Laterality Date  . APPENDECTOMY    . CATARACT EXTRACTION, BILATERAL     August 2017  . MASS EXCISION Left 07/04/2017   Procedure: REMOVAL TWO CYST EAR EAR LOBE AND EXTERNAL EAR;  Surgeon: Beverly Gust, MD;  Location: [facility];  Service: ENT;  Laterality: Left;  LOCAL Diabetic - insulin and oral meds    Current Outpatient Medications  Medication Sig Dispense Refill  . glucose blood test strip 1 each by Other route as needed for other. One Touch Ultra    . Insulin Glargine (LANTUS SOLOSTAR) 100 UNIT/ML Solostar Pen Inject 20 Units into the skin daily at 10 pm. 5 pen PRN  . insulin lispro (HUMALOG) 100 UNIT/ML KiwkPen Inject 0.04 mLs (4 Units total) into the skin 3 (three) times daily. 5 pen 11  . Insulin Pen Needle 31G X 4 MM MISC 1 Units by Does not apply route 2 (two) times daily. 100 each 12  . KOMBIGLYZE XR 2.08-998 MG TB24 TAKE 2 TABLETS BY MOUTH  DAILY 180 tablet 1  . ONETOUCH DELICA LANCETS FINE MISC Use TID 100 each 12  . pioglitazone (ACTOS) 30 MG tablet TAKE 1 TABLET BY MOUTH  DAILY 90 tablet 1  . simvastatin (ZOCOR) 80 MG tablet Take 0.5 tablets (40 mg total) by  mouth daily. 90 tablet 1   No current facility-administered medications for this visit.      Allergies:   Neosporin [neomycin-bacitracin zn-polymyx]   Social History:  The patient  reports that he quit smoking about 5 years ago. He has never used smokeless tobacco. He reports that he drinks about 4.8 - 8.4 oz of alcohol per week. He reports that he does not use drugs.   Family History:   family history includes Arrhythmia (age of onset: 17) in his mother; Cancer in his father; Diabetes in his mother.    Review of Systems: Review of Systems  Constitutional: Negative.    Respiratory: Positive for shortness of breath.   Cardiovascular: Negative.   Gastrointestinal: Negative.   Musculoskeletal: Negative.   Neurological: Positive for loss of consciousness.  Psychiatric/Behavioral: Negative.   All other systems reviewed and are negative.    PHYSICAL EXAM: VS:  BP 130/83 (BP Location: Left Arm, Patient Position: Sitting, Cuff Size: Normal)   Pulse 83   Ht 6' (1.829 m)   Wt 171 lb 8 oz (77.8 kg)   BMI 23.26 kg/m  , BMI Body mass index is 23.26 kg/m. Constitutional:  oriented to person, place, and time. No distress.  HENT:  Head: Normocephalic and atraumatic.  Eyes:  no discharge. No scleral icterus.  Neck: Normal range of motion. Neck supple. No JVD present.  Cardiovascular: Normal rate, regular rhythm, normal heart sounds and intact distal pulses. Exam reveals no gallop and no friction rub. No edema No murmur heard. Pulmonary/Chest: Effort normal and breath sounds normal. No stridor. No respiratory distress.  no wheezes.  no rales.  no tenderness.  Abdominal: Soft.  no distension.  no tenderness.  Musculoskeletal: Normal range of motion.  no  tenderness or deformity.  Neurological:  normal muscle tone. Coordination normal. No atrophy Skin: Skin is warm and dry. No rash noted. not diaphoretic.  Psychiatric:  normal mood and affect. behavior is normal. Thought content normal.    Recent Labs: 06/10/2017: ALT 10 06/14/2017: BUN 21; Creatinine, Ser 0.90; Potassium 4.7; Sodium 138    Lipid Panel Lab Results  Component Value Date   CHOL 161 06/10/2017   HDL 101 06/10/2017   LDLCALC 51 06/10/2017   TRIG 43 06/10/2017      Wt Readings from Last 3 Encounters:  08/01/17 171 lb 8 oz (77.8 kg)  07/17/17 168 lb 8 oz (76.4 kg)  07/16/17 169 lb 6.4 oz (76.8 kg)       ASSESSMENT AND PLAN:  Type 2 diabetes mellitus without complication, with long-term current use of insulin (HCC) Managed by primary care,   Syncope and collapse -  CT coronary  calcium score performed showing severe three-vessel coronary disease This raises the concern for underlying ischemia in the setting of syncope Discussed various treatment options for him including medical management, additional testing such as CT heart or stress testing, even cardiac catheterization After discussion of risk and benefits, testing procedures, He prefers cardiac catheterization I have reviewed the risks, indications, and alternatives to cardiac catheterization, possible angioplasty, and stenting with the patient. Risks include but are not limited to bleeding, infection, vascular injury, stroke, myocardial infection, arrhythmia, kidney injury, radiation-related injury in the case of prolonged fluoroscopy use, emergency cardiac surgery, and death. The patient understands the risks of serious complication is 1-2 in 6948 with diagnostic cardiac cath and 1-2% or less with angioplasty/stenting.  He prefers to proceed with cardiac catheterization tomorrow for syncope, shortness of breath,CAD, high coronary calcium  score  Essential hypertension, benign Blood pressure is well controlled on today's visit. No changes made to the medications.  High cholesterol -  Currently on a statin Will push for LDL less than 70  Hyperkalemia Will avoid ACE inhibitors ARB  Disposition:   Cardiac catheterization tomorrow Follow-up in 3 weeks time   Total encounter time more than 60 minutes  Greater than 50% was spent in counseling and coordination of care with the patient    Orders Placed This Encounter  Procedures  . CBC with Differential/Platelet  . Basic metabolic panel  . INR/PT     Signed, Esmond Plants, M.D., Ph.D. 08/01/2017  [place], [place]

## 2017-08-01 ENCOUNTER — Ambulatory Visit: Payer: Medicare Other | Admitting: Cardiovascular Disease

## 2017-08-01 ENCOUNTER — Other Ambulatory Visit: Payer: Self-pay | Admitting: Cardiovascular Disease

## 2017-08-01 ENCOUNTER — Encounter: Payer: Self-pay | Admitting: Cardiovascular Disease

## 2017-08-01 VITALS — BP 130/83 | HR 83 | Ht 72.0 in | Wt 171.5 lb

## 2017-08-01 DIAGNOSIS — R55 Syncope and collapse: Secondary | ICD-10-CM

## 2017-08-01 DIAGNOSIS — I10 Essential (primary) hypertension: Secondary | ICD-10-CM

## 2017-08-01 DIAGNOSIS — E875 Hyperkalemia: Secondary | ICD-10-CM

## 2017-08-01 DIAGNOSIS — I25118 Atherosclerotic heart disease of native coronary artery with other forms of angina pectoris: Secondary | ICD-10-CM | POA: Diagnosis not present

## 2017-08-01 DIAGNOSIS — Z794 Long term (current) use of insulin: Secondary | ICD-10-CM | POA: Diagnosis not present

## 2017-08-01 DIAGNOSIS — E7800 Pure hypercholesterolemia, unspecified: Secondary | ICD-10-CM | POA: Diagnosis not present

## 2017-08-01 DIAGNOSIS — E119 Type 2 diabetes mellitus without complications: Secondary | ICD-10-CM | POA: Diagnosis not present

## 2017-08-01 NOTE — Patient Instructions (Addendum)
Medication Instructions:   Aspirin 81 mg daily  Labwork:  No new labs needed  Testing/Procedures:  Cardiac cath tomorrow   Follow-Up: It was a pleasure seeing you in the office today. Please call us if you have new issues that need to be addressed before your next appt.  [phone]  Your physician wants you to follow-up in: 1 months.   If you need a refill on your cardiac medications before your next appointment, please call your pharmacy.  For educational health videos Log in to : [email or web address] Or : [email or web address], password : [email or web address]     [facility] Cardiac Cath Instructions   You are scheduled for a Cardiac Cath on:_Friday April 5th___  Please arrive at _08:30_am on the day of your procedure  Please expect a call from our [facility] to pre-register you  Do not eat/drink anything after midnight  Someone will need to drive you home  It is recommended someone be with you for the first 24 hours after your procedure  Wear clothes that are easy to get on/off and wear slip on shoes if possible   Medications bring a current list of all medications with you  _XX__ Do not take any diabetic medications or insulin in the morning.____   Day of your procedure: Arrive at the [facility] entrance.  Free valet service is available.  After entering the [facility] please check-in at the registration desk (1st desk on your right) to receive your armband. After receiving your armband someone will escort you to the cardiac cath/special procedures waiting area.  The usual length of stay after your procedure is about 2 to 3 hours.  This can vary.  If you have any questions, please call our office at [phone], or you may call the cardiac cath lab at [facility] directly at [phone]

## 2017-08-02 ENCOUNTER — Encounter
Admission: RE | Disposition: A | Discharge status: Home / Self Care | Payer: Self-pay | Source: Ambulatory Visit | Attending: Cardiovascular Disease

## 2017-08-02 ENCOUNTER — Encounter: Payer: Self-pay | Admitting: *Deleted

## 2017-08-02 ENCOUNTER — Other Ambulatory Visit: Payer: Self-pay

## 2017-08-02 ENCOUNTER — Ambulatory Visit
Admission: RE | Admit: 2017-08-02 | Discharge: 2017-08-03 | Disposition: A | Discharge status: Home / Self Care | Payer: Medicare Other | Source: Ambulatory Visit | Attending: Cardiovascular Disease | Admitting: Cardiovascular Disease

## 2017-08-02 DIAGNOSIS — E119 Type 2 diabetes mellitus without complications: Secondary | ICD-10-CM | POA: Diagnosis not present

## 2017-08-02 DIAGNOSIS — I2584 Coronary atherosclerosis due to calcified coronary lesion: Secondary | ICD-10-CM | POA: Insufficient documentation

## 2017-08-02 DIAGNOSIS — E785 Hyperlipidemia, unspecified: Secondary | ICD-10-CM | POA: Insufficient documentation

## 2017-08-02 DIAGNOSIS — I152 Hypertension secondary to endocrine disorders: Secondary | ICD-10-CM | POA: Diagnosis present

## 2017-08-02 DIAGNOSIS — Z794 Long term (current) use of insulin: Secondary | ICD-10-CM | POA: Insufficient documentation

## 2017-08-02 DIAGNOSIS — Z87891 Personal history of nicotine dependence: Secondary | ICD-10-CM | POA: Insufficient documentation

## 2017-08-02 DIAGNOSIS — E1165 Type 2 diabetes mellitus with hyperglycemia: Secondary | ICD-10-CM

## 2017-08-02 DIAGNOSIS — I10 Essential (primary) hypertension: Secondary | ICD-10-CM | POA: Diagnosis not present

## 2017-08-02 DIAGNOSIS — Z8249 Family history of ischemic heart disease and other diseases of the circulatory system: Secondary | ICD-10-CM | POA: Insufficient documentation

## 2017-08-02 DIAGNOSIS — R55 Syncope and collapse: Secondary | ICD-10-CM

## 2017-08-02 DIAGNOSIS — Z79899 Other long term (current) drug therapy: Secondary | ICD-10-CM | POA: Diagnosis not present

## 2017-08-02 DIAGNOSIS — R0602 Shortness of breath: Secondary | ICD-10-CM | POA: Diagnosis present

## 2017-08-02 DIAGNOSIS — E1169 Type 2 diabetes mellitus with other specified complication: Secondary | ICD-10-CM | POA: Diagnosis present

## 2017-08-02 DIAGNOSIS — E7800 Pure hypercholesterolemia, unspecified: Secondary | ICD-10-CM | POA: Diagnosis not present

## 2017-08-02 DIAGNOSIS — I2510 Atherosclerotic heart disease of native coronary artery without angina pectoris: Secondary | ICD-10-CM | POA: Diagnosis not present

## 2017-08-02 DIAGNOSIS — Z9889 Other specified postprocedural states: Secondary | ICD-10-CM | POA: Diagnosis not present

## 2017-08-02 DIAGNOSIS — R9430 Abnormal result of cardiovascular function study, unspecified: Secondary | ICD-10-CM | POA: Insufficient documentation

## 2017-08-02 DIAGNOSIS — I25119 Atherosclerotic heart disease of native coronary artery with unspecified angina pectoris: Secondary | ICD-10-CM | POA: Diagnosis present

## 2017-08-02 DIAGNOSIS — R931 Abnormal findings on diagnostic imaging of heart and coronary circulation: Secondary | ICD-10-CM | POA: Diagnosis not present

## 2017-08-02 DIAGNOSIS — E1159 Type 2 diabetes mellitus with other circulatory complications: Secondary | ICD-10-CM | POA: Diagnosis present

## 2017-08-02 HISTORY — DX: Atherosclerotic heart disease of native coronary artery without angina pectoris: I25.10

## 2017-08-02 HISTORY — DX: Syncope and collapse: R55

## 2017-08-02 HISTORY — PX: CORONARY STENT INTERVENTION: CATH118234

## 2017-08-02 HISTORY — DX: Essential (primary) hypertension: I10

## 2017-08-02 HISTORY — PX: LEFT HEART CATH AND CORONARY ANGIOGRAPHY: CATH118249

## 2017-08-02 LAB — BASIC METABOLIC PANEL
BUN/Creatinine Ratio: 18 (ref 10–24)
BUN: 17 mg/dL (ref 8–27)
CO2: 22 mmol/L (ref 20–29)
CREATININE: 0.97 mg/dL (ref 0.76–1.27)
Calcium: 10.1 mg/dL (ref 8.6–10.2)
Chloride: 105 mmol/L (ref 96–106)
GFR calc Af Amer: 92 mL/min/{1.73_m2} (ref >59)
GFR, EST NON AFRICAN AMERICAN: 79 mL/min/{1.73_m2} (ref >59)
Glucose: 50 mg/dL — ABNORMAL LOW (ref 65–99)
Potassium: 4.7 mmol/L (ref 3.5–5.2)
SODIUM: 143 mmol/L (ref 134–144)

## 2017-08-02 LAB — PROTIME-INR
INR: 1 (ref 0.8–1.2)
PROTHROMBIN TIME: 10.6 s (ref 9.1–12.0)

## 2017-08-02 LAB — CBC WITH DIFFERENTIAL/PLATELET
Basophils Absolute: 0.1 10*3/uL (ref 0.0–0.2)
Basos: 1 %
EOS (ABSOLUTE): 0.2 10*3/uL (ref 0.0–0.4)
EOS: 2 %
HEMATOCRIT: 39.5 % (ref 37.5–51.0)
HEMOGLOBIN: 12.4 g/dL — AB (ref 13.0–17.7)
IMMATURE GRANS (ABS): 0 10*3/uL (ref 0.0–0.1)
Immature Granulocytes: 1 %
LYMPHS ABS: 3 10*3/uL (ref 0.7–3.1)
Lymphs: 34 %
MCH: 30.2 pg (ref 26.6–33.0)
MCHC: 31.4 g/dL — AB (ref 31.5–35.7)
MCV: 96 fL (ref 79–97)
MONOCYTES: 7 %
Monocytes Absolute: 0.6 10*3/uL (ref 0.1–0.9)
Neutrophils Absolute: 5 10*3/uL (ref 1.4–7.0)
Neutrophils: 55 %
Platelets: 234 10*3/uL (ref 150–379)
RBC: 4.1 x10E6/uL — AB (ref 4.14–5.80)
RDW: 15.1 % (ref 12.3–15.4)
WBC: 8.9 10*3/uL (ref 3.4–10.8)

## 2017-08-02 LAB — GLUCOSE, CAPILLARY
GLUCOSE-CAPILLARY: 79 mg/dL (ref 65–99)
GLUCOSE-CAPILLARY: 62 mg/dL — AB (ref 65–99)
Glucose-Capillary: 92 mg/dL (ref 65–99)
Glucose-Capillary: 54 mg/dL — ABNORMAL LOW (ref 65–99)
Glucose-Capillary: 158 mg/dL — ABNORMAL HIGH (ref 65–99)
Glucose-Capillary: 224 mg/dL — ABNORMAL HIGH (ref 65–99)
Glucose-Capillary: 86 mg/dL (ref 65–99)

## 2017-08-02 LAB — POCT ACTIVATED CLOTTING TIME: ACTIVATED CLOTTING TIME: 400 s

## 2017-08-02 SURGERY — LEFT HEART CATH AND CORONARY ANGIOGRAPHY
Anesthesia: Moderate Sedation

## 2017-08-02 SURGERY — LEFT HEART CATH AND CORONARY ANGIOGRAPHY
Anesthesia: Moderate Sedation | Laterality: Bilateral

## 2017-08-02 MED ORDER — NITROGLYCERIN 5 MG/ML IV SOLN
INTRAVENOUS | Status: AC
  Filled 2017-08-02: qty 10

## 2017-08-02 MED ORDER — MIDAZOLAM HCL 2 MG/2ML IJ SOLN
INTRAMUSCULAR | Status: DC | PRN
  Administered 2017-08-02: 1 mg via INTRAVENOUS

## 2017-08-02 MED ORDER — IOPAMIDOL (ISOVUE-300) INJECTION 61%
INTRAVENOUS | Status: DC | PRN
  Administered 2017-08-02: 110 mL via INTRA_ARTERIAL

## 2017-08-02 MED ORDER — CLOPIDOGREL BISULFATE 75 MG PO TABS
ORAL_TABLET | ORAL | Status: AC
  Filled 2017-08-02: qty 8

## 2017-08-02 MED ORDER — PIOGLITAZONE HCL 30 MG PO TABS
30.0000 mg | ORAL_TABLET | Freq: Every day | ORAL | Status: DC
  Administered 2017-08-03: 30 mg via ORAL
  Filled 2017-08-02 (×2): qty 1

## 2017-08-02 MED ORDER — INSULIN ASPART 100 UNIT/ML ~~LOC~~ SOLN
4.0000 [IU] | Freq: Three times a day (TID) | SUBCUTANEOUS | Status: DC
  Filled 2017-08-02: qty 1

## 2017-08-02 MED ORDER — CLOPIDOGREL BISULFATE 75 MG PO TABS
75.0000 mg | ORAL_TABLET | Freq: Every day | ORAL | Status: DC
  Administered 2017-08-03: 75 mg via ORAL
  Filled 2017-08-02: qty 1

## 2017-08-02 MED ORDER — ATORVASTATIN CALCIUM 20 MG PO TABS
40.0000 mg | ORAL_TABLET | Freq: Every day | ORAL | Status: DC
  Administered 2017-08-02: 40 mg via ORAL
  Filled 2017-08-02: qty 2

## 2017-08-02 MED ORDER — CLOPIDOGREL BISULFATE 75 MG PO TABS
ORAL_TABLET | ORAL | Status: DC | PRN
  Administered 2017-08-02: 600 mg via ORAL

## 2017-08-02 MED ORDER — SODIUM CHLORIDE 0.9 % WEIGHT BASED INFUSION: 1.0000 mL/kg/h | INTRAVENOUS | Status: DC

## 2017-08-02 MED ORDER — SODIUM CHLORIDE 0.9 % WEIGHT BASED INFUSION: 3.0000 mL/kg/h | INTRAVENOUS | Status: DC

## 2017-08-02 MED ORDER — ASPIRIN EC 81 MG PO TBEC
81.0000 mg | DELAYED_RELEASE_TABLET | Freq: Every day | ORAL | Status: DC
Start: 2017-08-02 — End: 2017-08-03
  Administered 2017-08-03: 81 mg via ORAL
  Filled 2017-08-02: qty 1

## 2017-08-02 MED ORDER — INSULIN GLARGINE 100 UNIT/ML ~~LOC~~ SOLN
20.0000 [IU] | Freq: Every day | SUBCUTANEOUS | Status: DC
  Administered 2017-08-02: 20 [IU] via SUBCUTANEOUS
  Filled 2017-08-02 (×3): qty 0.2

## 2017-08-02 MED ORDER — CLOPIDOGREL BISULFATE 75 MG PO TABS: 75.0000 mg | ORAL_TABLET | Freq: Every day | ORAL | 6 refills | Status: DC

## 2017-08-02 MED ORDER — SODIUM CHLORIDE 0.9 % WEIGHT BASED INFUSION
1.0000 mL/kg/h | INTRAVENOUS | Status: AC
  Administered 2017-08-02: 1 mL/kg/h via INTRAVENOUS

## 2017-08-02 MED ORDER — SODIUM CHLORIDE 0.9% FLUSH: 3.0000 mL | INTRAVENOUS | Status: DC | PRN

## 2017-08-02 MED ORDER — BIVALIRUDIN TRIFLUOROACETATE 250 MG IV SOLR
INTRAVENOUS | Status: AC
  Filled 2017-08-02: qty 250

## 2017-08-02 MED ORDER — ASPIRIN 81 MG PO CHEW
CHEWABLE_TABLET | ORAL | Status: DC | PRN
  Administered 2017-08-02: 324 mg via ORAL

## 2017-08-02 MED ORDER — SODIUM CHLORIDE 0.9% FLUSH
3.0000 mL | Freq: Two times a day (BID) | INTRAVENOUS | Status: DC
  Administered 2017-08-02: 3 mL via INTRAVENOUS

## 2017-08-02 MED ORDER — BIVALIRUDIN BOLUS VIA INFUSION - CUPID
INTRAVENOUS | Status: DC | PRN
  Administered 2017-08-02: 58.2 mg via INTRAVENOUS

## 2017-08-02 MED ORDER — ONDANSETRON HCL 4 MG/2ML IJ SOLN: 4.0000 mg | Freq: Four times a day (QID) | INTRAMUSCULAR | Status: DC | PRN

## 2017-08-02 MED ORDER — LIDOCAINE-EPINEPHRINE (PF) 1 %-1:200000 IJ SOLN
INTRAMUSCULAR | Status: AC
  Filled 2017-08-02: qty 30

## 2017-08-02 MED ORDER — ASPIRIN 81 MG PO CHEW: 81.0000 mg | CHEWABLE_TABLET | ORAL | Status: DC

## 2017-08-02 MED ORDER — FENTANYL CITRATE (PF) 100 MCG/2ML IJ SOLN
INTRAMUSCULAR | Status: DC | PRN
  Administered 2017-08-02: 25 ug via INTRAVENOUS

## 2017-08-02 MED ORDER — INSULIN PEN NEEDLE 31G X 4 MM MISC: 1.0000 [IU] | Freq: Two times a day (BID) | Status: DC

## 2017-08-02 MED ORDER — SODIUM CHLORIDE 0.9 % IV SOLN: 250.0000 mL | INTRAVENOUS | Status: DC | PRN

## 2017-08-02 MED ORDER — NITROGLYCERIN 0.4 MG SL SUBL: 0.4000 mg | SUBLINGUAL_TABLET | SUBLINGUAL | Status: DC | PRN

## 2017-08-02 MED ORDER — ACETAMINOPHEN 325 MG PO TABS: 650.0000 mg | ORAL_TABLET | ORAL | Status: DC | PRN

## 2017-08-02 MED ORDER — NITROGLYCERIN 0.4 MG SL SUBL: 0.4000 mg | SUBLINGUAL_TABLET | SUBLINGUAL | 3 refills | Status: DC | PRN

## 2017-08-02 MED ORDER — IOPAMIDOL (ISOVUE-300) INJECTION 61%
INTRAVENOUS | Status: DC | PRN
  Administered 2017-08-02: 40 mL via INTRA_ARTERIAL

## 2017-08-02 MED ORDER — MIDAZOLAM HCL 2 MG/2ML IJ SOLN
INTRAMUSCULAR | Status: AC
  Filled 2017-08-02: qty 2

## 2017-08-02 MED ORDER — HEPARIN (PORCINE) IN NACL 2-0.9 UNIT/ML-% IJ SOLN
INTRAMUSCULAR | Status: AC
  Filled 2017-08-02: qty 1000

## 2017-08-02 MED ORDER — FENTANYL CITRATE (PF) 100 MCG/2ML IJ SOLN
INTRAMUSCULAR | Status: AC
  Filled 2017-08-02: qty 2

## 2017-08-02 MED ORDER — ASPIRIN 81 MG PO CHEW
CHEWABLE_TABLET | ORAL | Status: AC
  Filled 2017-08-02: qty 4

## 2017-08-02 MED ORDER — SODIUM CHLORIDE 0.9 % IV SOLN
INTRAVENOUS | Status: DC | PRN
  Administered 2017-08-02: 1.75 mg/kg/h via INTRAVENOUS

## 2017-08-02 SURGICAL SUPPLY — 18 items
BALLN TREK RX 2.5X12 (BALLOONS) ×3
BALLN ~~LOC~~ TREK RX 3.75X8 (BALLOONS) ×3
BALLOON TREK RX 2.5X12 (BALLOONS) ×2 IMPLANT
BALLOON ~~LOC~~ TREK RX 3.75X8 (BALLOONS) ×2 IMPLANT
CATH INFINITI 5FR ANG PIGTAIL (CATHETERS) ×3 IMPLANT
CATH INFINITI 5FR JL4 (CATHETERS) ×3 IMPLANT
CATH INFINITI JR4 5F (CATHETERS) ×3 IMPLANT
CATH VISTA GUIDE 6FR JR4 (CATHETERS) ×3 IMPLANT
DEVICE CLOSURE MYNXGRIP 6/7F (Vascular Products) ×3 IMPLANT
DEVICE INFLAT 30 PLUS (MISCELLANEOUS) ×3 IMPLANT
KIT MANI 3VAL PERCEP (MISCELLANEOUS) ×3 IMPLANT
NEEDLE PERC 18GX7CM (NEEDLE) ×3 IMPLANT
PACK CARDIAC CATH (CUSTOM PROCEDURE TRAY) ×3 IMPLANT
SHEATH AVANTI 5FR X 11CM (SHEATH) ×3 IMPLANT
SHEATH AVANTI 6FR X 11CM (SHEATH) ×3 IMPLANT
STENT SIERRA 3.50 X 15 MM (Permanent Stent) ×3 IMPLANT
WIRE GUIDERIGHT .035X150 (WIRE) ×3 IMPLANT
WIRE RUNTHROUGH .014X180CM (WIRE) ×3 IMPLANT

## 2017-08-02 NOTE — Progress Notes (Signed)
Hypoglycemic Event  CBG: 65  Treatment: 4 oz of orange juice Symptoms: none Follow-up CBG: Time:2115 CBG Result:86  Possible Reasons for Event: poor po in take Comments/MD notified:n/a    Joanne Gavel  po

## 2017-08-02 NOTE — Discharge Instructions (Signed)
**PLEASE REMEMBER TO BRING ALL OF YOUR MEDICATIONS TO EACH OF YOUR FOLLOW-UP OFFICE VISITS.  NO HEAVY LIFTING OR SEXUAL ACTIVITY X 7 DAYS. NO DRIVING X 3-5 DAYS. NO SOAKING BATHS, HOT TUBS, POOLS, ETC., X 7 DAYS.   Groin Site Care Refer to this sheet in the next few weeks. These instructions provide you with information on caring for yourself after your procedure. Your caregiver may also give you more specific instructions. Your treatment has been planned according to current medical practices, but problems sometimes occur. Call your caregiver if you have any problems or questions after your procedure. HOME CARE INSTRUCTIONS You may shower 24 hours after the procedure. Remove the bandage (dressing) and gently wash the site with plain soap and water. Gently pat the site dry.  Do not apply powder or lotion to the site.  Do not sit in a bathtub, swimming pool, or whirlpool for 5 to 7 days.  No bending, squatting, or lifting anything over 10 pounds (4.5 kg) as directed by your caregiver.  Inspect the site at least twice daily.  Do not drive home if you are discharged the same day of the procedure. Have someone else drive you.   What to expect: Any bruising will usually fade within 1 to 2 weeks.  Blood that collects in the tissue (hematoma) may be painful to the touch. It should usually decrease in size and tenderness within 1 to 2 weeks.  SEEK IMMEDIATE MEDICAL CARE IF: You have unusual pain at the groin site or down the affected leg.  You have redness, warmth, swelling, or pain at the groin site.  You have drainage (other than a small amount of blood on the dressing).  You have chills.  You have a fever or persistent symptoms for more than 72 hours.  You have a fever and your symptoms suddenly get worse.  Your leg becomes pale, cool, tingly, or numb.  You have heavy bleeding from the site. Hold pressure on the site.  _____________    You have received care from [facility] during this hospital stay and we look forward to continuing to provide you with excellent care in our office settings after you've left the hospital.  In order to assure a smoother transition to home following your discharge from the hospital, we will likely have you see one of our nurse practitioners or physician assistants within a few weeks of discharge.  Our advanced practice providers work closely with your physician in order to address all of your heart's needs in a timely manner.  More information about all of our providers may be found here: [email or web address]  Please plan to bring all of your prescriptions to your follow-up appointment and don't hesitate to contact us with questions or concerns.  [facility] [place] - [phone] [facility] [place] - [phone] [facility] [address] - [phone] [facility] [place] - [phone] [facility] [place] - [phone] [facility] [place] - [phone] [facility] [place] - [phone] [facility] [place] - [phone] [facility] [place] - [phone] _____________    10 Habits of Highly Healthy People  [facility] wants to help you get well and stay well.  Live a longer, healthier life by practicing healthy habits every day.  1.  Visit your primary care provider regularly. 2.  Make time for family and friends.  Healthy relationships are important. 3.  Take medications as directed by your provider. 4.  Maintain a healthy weight and a trim waistline.   5.  Eat healthy meals and snacks, rich in fruits, vegetables, whole grains, and lean proteins. 6.  Get moving every day - aim for 150 minutes of moderate physical activity each week. 7.  Don't smoke. 8.  Avoid alcohol or drink in moderation. 9.  Manage stress through meditation or mindful relaxation. 10.  Get seven to nine hours of quality sleep each night.  Want more  information on healthy habits?  To learn more about these and other healthy habits, visit [email or web address]. _____________    

## 2017-08-02 NOTE — Progress Notes (Signed)
As of 1245 Pt still has slight ooze at sight after full 30 minutes of manual pressure. Soft at groin site. Cath Lab tech at bedside holding pressure. Dr. Fletcher Anon paged per Cath Lab team at 1245 , MD to bedside to assess. Lido/Epi injected per MD to site 1250. Oozing stopped, site soft without hematoma. PAD placed per Cath Lab Tech 1255. Per Dr. Fletcher Anon to keep Pt flat for 2 hours and keep PAD on for 1 hr. Will continue tio monitor Pt closely.

## 2017-08-02 NOTE — H&P (Signed)
H&P Addendum, precardiac catheterization  Patient was seen and evaluated prior to Cardiac catheterization procedure Symptoms, prior testing details again confirmed with the patient  Recent syncope CAD, CT coronary calcium score greater than 2300, shortness of breath Concern for unstable angina  Patient examined, no significant change from prior exam Lab work reviewed in detail personally by myself Patient understands risk and benefit of the procedure, willing to proceed  Signed, Esmond Plants, MD, Ph.D [facility]

## 2017-08-02 NOTE — Discharge Summary (Signed)
Discharge Summary    Patient ID: Johnny Wolfe,  MRN: [id number], DOB/AGE: 70/12/1945 70 y.o.  Admit date: 08/02/2017 Discharge date: 08/03/2017  Primary Care Provider: Kathrine Wolfe Primary Cardiologist: Johnny Rogue, MD  Discharge Diagnoses    Principal Problem:   Coronary artery disease involving native coronary artery with angina pectoris [facility])  **Status post PCI and drug-eluting stent placement to the right coronary artery this admission. Active Problems:   Abnormal screening cardiac CT   Type 2 diabetes mellitus without complication (HCC)   Essential hypertension, benign   Syncope and collapse   High cholesterol   Allergies Allergies  Allergen Reactions  . Neosporin [Neomycin-Bacitracin Zn-Polymyx]     Red rash     Diagnostic Studies/Procedures    Cardiac Catheterization and Percutaneous Coronary Intervention 4.5.2019  Left mainstem: Large vessel that bifurcates into the LAD and left circumflex, no significant disease noted  Left anterior descending (LAD): Large vessel that extends to the apical region, diagonal branch 2 of moderate size, long region of proximal 60% heavily calcified disease, mid to distal 40% disease. Mid to distal LAD is small to moderate in caliber, entire vessel is heavily calcified  Left circumflex (LCx): Large vessel with OM branch 2, heavily calcified vessel, 60% stenosis proximal disease, heavily calcified, 40% proximal OM2 disease.   Right coronary artery (RCA): Right dominant vessel with PL and PDA, 80% proximal RCA disease, region of post stenosis aneurysm (distally), 40% mid RCA disease, 30% distal disease.   **The right coronary artery was successfully stented using a 3.5 x 15 mm Sierra drug-eluting stent.**  Left ventriculography: Left ventricular systolic function is normal, LVEF is estimated at 55-65%, there is no significant mitral regurgitation , no significant aortic valve stenosis  _____________   History of Present  Illness     72 year old male with a history of hypertension, hyperlipidemia, diabetes, remote tobacco abuse, and alcohol abuse.  He was recently evaluated in clinic by Dr. Rockey Wolfe secondary to recurrent syncope that occurred earlier this year.  He was noted to be orthostatic and had chronically low blood pressures on lisinopril therapy and that had previously been discontinued.  He denies symptoms of angina but did have a family history of coronary artery disease and was interested in risk stratification.  As result, he underwent cardiac CT with calcium scoring.  This was abnormal, with a calcium score of 2310, placing him in the 95th percentile for age and sex-matched control.  He was seen back in clinic on April 4 and given abnormal CT, there was concern that perhaps ischemia was playing a role in syncope.  As result, he was set up for diagnostic catheterization.  Hospital Course     Consultants: None  [name] presented to the cardiac catheterization laboratory at [facility] on April 5 and underwent diagnostic catheterization revealing severe proximal right coronary artery disease with otherwise nonobstructive disease throughout his left coronary tree.  LV function was normal.  He then underwent successful PCI and drug-eluting stent placement to the proximal right coronary artery using a 3.5 x 15 mm Sierra drug-eluting stent.  He tolerated the procedure well and postprocedure has been ambulating without recurrent symptoms or limitations.  He will be discharged home April 6 in good condition and we will arrange for follow-up within the next 2 weeks.  He will hold Kombiglyze until 08/05/2017.   It appears the patient's PTA meds were changed/addressed in the post-cath order set leading to the inability to reconcile the  medications to populate on the AVS at discharge without discontinuing them all and re-ordering.  _____________  Discharge Vitals Blood pressure 128/62, pulse 72,  temperature 97.8 F (36.6 C), temperature source Oral, resp. rate 14, height 6' (1.829 m), weight 171 lb (77.6 kg), SpO2 97 %.  Filed Weights   08/02/17 0851 08/02/17 1455  Weight: 171 lb (77.6 kg) 171 lb (77.6 kg)    Labs & Radiologic Studies    CBC Recent Labs    08/01/17 1432 08/03/17 0356  WBC 8.9 5.6  NEUTROABS 5.0  --   HGB 12.4* 11.2*  HCT 39.5 34.0*  MCV 96 95.6  PLT 234 536   Basic Metabolic Panel Recent Labs    08/01/17 1432 08/03/17 0356  NA 143 137  K 4.7 4.5  CL 105 106  CO2 22 26  GLUCOSE 50* 96  BUN 17 20  CREATININE 0.97 0.82  CALCIUM 10.1 8.8*  _____________  Ct Cardiac Scoring  Addendum Date: 07/29/2017   ADDENDUM REPORT: 07/29/2017 08:52 CLINICAL DATA:  Risk stratification EXAM: Coronary Calcium Score TECHNIQUE: The patient was scanned on a [employer] scanner. Axial non-contrast 3 mm slices were carried out through the heart. The data set was analyzed on a dedicated work station and scored using the [place]. FINDINGS: Non-cardiac: See separate report from [facility]. Ascending Aorta: Normal size, no calcifications. Pericardium: Normal. Coronary arteries: Normal origin. IMPRESSION: Coronary calcium score of 2310. This was 95 percentile for age and sex matched control. This is a high calcium score. Additional analysis with a functional study such as CT/FFR or a stress test is recommended. Electronically Signed   By: Ena Dawley   On: 07/29/2017 08:52   Result Date: 07/29/2017 EXAM: OVER-READ INTERPRETATION  CT CHEST The following report is an over-read performed by radiologist Dr. Vinnie Langton of [facility], [place] on 07/25/2017. This over-read does not include interpretation of cardiac or coronary anatomy or pathology. The coronary calcium score interpretation by the cardiologist is attached. COMPARISON:  None. FINDINGS: Aortic atherosclerosis. Several calcified granulomas in the lungs bilaterally. In addition, in the left lower  lobe posteriorly (axial image 31 of series 3) there is a 5 mm nodule, and an 8 mm nodule in the left lower lobe on axial image 32 of series 3. Several bronchi leading up to these nodules in the left lower lobe demonstrate severe bronchial wall thickening which is greater than the other bronchi which are all mildly diffusely thickened. No consolidative airspace disease, pleural effusions or lymphadenopathy noted in the visualized portions of the thorax. Several calcified granulomas are noted throughout the visualized portions of the liver and spleen. There are no aggressive appearing lytic or blastic lesions noted in the visualized portions of the skeleton. IMPRESSION: 1. Old granulomatous disease, as above. In addition, there are small pulmonary nodules in the left lower lobe which are noncalcified measuring 5 and 8 mm. These are nonspecific but warrant attention on follow-up studies. Non-contrast chest CT at 3-6 months is recommended. If the nodules are stable at time of repeat CT, then future CT at 18-24 months (from today's scan) is considered optional for low-risk patients, but is recommended for high-risk patients. This recommendation follows the consensus statement: Guidelines for Management of Incidental Pulmonary Nodules Detected on CT Images: From the Fleischner Society 2017; Radiology 2017; 284:228-243. 2.  Aortic Atherosclerosis (ICD10-I70.0). Electronically Signed: By: Vinnie Langton M.D. On: 07/25/2017 15:46   Disposition   Pt is being discharged home today in good condition.  Follow-up Plans & Appointments    Follow-up Information    Minna Merritts, MD Follow up on 08/16/2017.   Specialty:  Cardiology Why:  11:00 AM Contact information: [place] [address] [phone]          Discharge Instructions    AMB Referral to Cardiac Rehabilitation - Phase II   Complete by:  As directed    Diagnosis:  Coronary Stents   Call MD for:  redness,  tenderness, or signs of infection (pain, swelling, redness, odor or green/yellow discharge around incision site)   Complete by:  As directed    Call MD for:  severe uncontrolled pain   Complete by:  As directed    Call MD for:  temperature >100.4   Complete by:  As directed    Diet - low sodium heart healthy   Complete by:  As directed    Diet - low sodium heart healthy   Complete by:  As directed    Increase activity slowly   Complete by:  As directed    Increase activity slowly   Complete by:  As directed       Discharge Medications   Allergies as of 08/03/2017      Reactions   Neosporin [neomycin-bacitracin Zn-polymyx]    Red rash      Allergies as of 08/03/2017      Reactions   Neosporin [neomycin-bacitracin Zn-polymyx]    Red rash       Medication List    STOP taking these medications   simvastatin 80 MG tablet Commonly known as:  ZOCOR Replaced by:  atorvastatin 40 MG tablet     TAKE these medications   aspirin EC 81 MG tablet Take 81 mg by mouth daily.   atorvastatin 40 MG tablet Commonly known as:  LIPITOR Take 1 tablet (40 mg total) by mouth daily at 6 PM. Replaces:  simvastatin 80 MG tablet   clopidogrel 75 MG tablet Commonly known as:  PLAVIX Take 1 tablet (75 mg total) by mouth daily with breakfast.   glucose blood test strip 1 each by Other route as needed for other. One Touch Ultra   insulin lispro 100 UNIT/ML injection Commonly known as:  HUMALOG Inject 4 Units into the skin 3 (three) times daily with meals.   Insulin Pen Needle 31G X 4 MM Misc 1 Units by Does not apply route 2 (two) times daily.   KOMBIGLYZE XR 2.08-998 MG Tb24 Generic drug:  Saxagliptin-Metformin TAKE 2 TABLETS BY MOUTH  DAILY   LANTUS SOLOSTAR [place] Inject 15 Units into the skin daily after breakfast.   metoprolol tartrate 25 MG tablet Commonly known as:  LOPRESSOR Take 0.5 tablets (12.5 mg total) by mouth 2 (two) times daily.   nitroGLYCERIN 0.4 MG SL  tablet Commonly known as:  NITROSTAT Place 1 tablet (0.4 mg total) under the tongue every 5 (five) minutes as needed for chest pain.   ONETOUCH DELICA LANCETS FINE Misc Use TID   pioglitazone 30 MG tablet Commonly known as:  ACTOS Take 30 mg by mouth daily.         Outstanding Labs/Studies   None  Duration of Discharge Encounter   On my exam today (day of discharge): Vitals:   08/03/17 0552 08/03/17 0752  BP: 127/62 128/62  Pulse: 73 72  Resp:  14  Temp: 97.7 F (36.5 C) 97.8 F (36.6 C)  SpO2: 96% 97%   Pt is alert and oriented,  NAD HEENT: normal Neck: JVP - normal Lungs: CTA bilaterally CV: RRR without murmur or gallop Abd: soft, NT, Positive BS, no hepatomegaly Ext: no C/C/E, distal pulses intact and equal.  The right groin site has ecchymosis with no hematoma or tenderness. Skin: warm/dry no rash  The patient is feeling well this morning.  He has no chest pain or shortness of breath.  Cardiac catheterization data reviewed.  The patient appears to have had an uncomplicated PCI of the right coronary artery with a drug-eluting stent.  He should be maintained on dual antiplatelet therapy with aspirin and clopidogrel for at least 6 months without interruption.  He is unable to take ACE inhibitors or ARB's because of hyperkalemia.  Will add a low-dose of metoprolol 12.5 mg twice daily.  We will continue him on his statin drug his LDL cholesterol is 51 mg/dL.Marland Kitchen  Arrange follow-up with Dr. Rockey Wolfe.  This morning's EKG demonstrates normal sinus rhythm with right bundle branch block which is unchanged from his previous EKG tracings.  Greater than 30 minutes including physician time.  Melvern Banker MD 08/03/2017, 9:05 AM

## 2017-08-02 NOTE — Progress Notes (Signed)
Pt assessed at 1215 . Right groin with new drainage and slight firmness around site. Pressure held for 15 minutes  With some improvement to hematoma at site but still will slow ooze.VSS  , pulses unchanged, denies pain. Dr. Fletcher Anon paged at 1230 and updated on Pt status. Agreed to hold pressure until hemostasis achieved and replace PAD to site.

## 2017-08-02 NOTE — Progress Notes (Addendum)
Hypoglycemic Event  CBG: 54  Treatment: Juice and peanut butter crackers  Symptoms: none  Follow-up CBG: Time:1232 --79  Possible Reasons for Event: Pt NPO for CATH  Comments/MD notified:    Ranell Patrick [place]

## 2017-08-03 DIAGNOSIS — Z8249 Family history of ischemic heart disease and other diseases of the circulatory system: Secondary | ICD-10-CM | POA: Diagnosis not present

## 2017-08-03 DIAGNOSIS — I25119 Atherosclerotic heart disease of native coronary artery with unspecified angina pectoris: Secondary | ICD-10-CM | POA: Diagnosis not present

## 2017-08-03 DIAGNOSIS — R931 Abnormal findings on diagnostic imaging of heart and coronary circulation: Secondary | ICD-10-CM | POA: Diagnosis not present

## 2017-08-03 DIAGNOSIS — R9430 Abnormal result of cardiovascular function study, unspecified: Secondary | ICD-10-CM | POA: Diagnosis not present

## 2017-08-03 DIAGNOSIS — Z79899 Other long term (current) drug therapy: Secondary | ICD-10-CM | POA: Diagnosis not present

## 2017-08-03 DIAGNOSIS — E7800 Pure hypercholesterolemia, unspecified: Secondary | ICD-10-CM | POA: Diagnosis not present

## 2017-08-03 DIAGNOSIS — E785 Hyperlipidemia, unspecified: Secondary | ICD-10-CM | POA: Diagnosis not present

## 2017-08-03 DIAGNOSIS — R55 Syncope and collapse: Secondary | ICD-10-CM | POA: Diagnosis not present

## 2017-08-03 DIAGNOSIS — I2584 Coronary atherosclerosis due to calcified coronary lesion: Secondary | ICD-10-CM | POA: Diagnosis not present

## 2017-08-03 DIAGNOSIS — Z87891 Personal history of nicotine dependence: Secondary | ICD-10-CM | POA: Diagnosis not present

## 2017-08-03 DIAGNOSIS — I10 Essential (primary) hypertension: Secondary | ICD-10-CM | POA: Diagnosis not present

## 2017-08-03 DIAGNOSIS — E119 Type 2 diabetes mellitus without complications: Secondary | ICD-10-CM | POA: Diagnosis not present

## 2017-08-03 DIAGNOSIS — Z794 Long term (current) use of insulin: Secondary | ICD-10-CM | POA: Diagnosis not present

## 2017-08-03 LAB — CBC
HCT: 34 % — ABNORMAL LOW (ref 40.0–52.0)
Hemoglobin: 11.2 g/dL — ABNORMAL LOW (ref 13.0–18.0)
MCH: 31.4 pg (ref 26.0–34.0)
MCHC: 32.8 g/dL (ref 32.0–36.0)
MCV: 95.6 fL (ref 80.0–100.0)
PLATELETS: 171 10*3/uL (ref 150–440)
RBC: 3.55 MIL/uL — ABNORMAL LOW (ref 4.40–5.90)
RDW: 15.4 % — ABNORMAL HIGH (ref 11.5–14.5)
WBC: 5.6 10*3/uL (ref 3.8–10.6)

## 2017-08-03 LAB — BASIC METABOLIC PANEL
Anion gap: 5 (ref 5–15)
BUN: 20 mg/dL (ref 6–20)
CALCIUM: 8.8 mg/dL — AB (ref 8.9–10.3)
CHLORIDE: 106 mmol/L (ref 101–111)
CO2: 26 mmol/L (ref 22–32)
CREATININE: 0.82 mg/dL (ref 0.61–1.24)
GFR calc non Af Amer: >60 mL/min (ref >60)
GLUCOSE: 96 mg/dL (ref 65–99)
Potassium: 4.5 mmol/L (ref 3.5–5.1)
Sodium: 137 mmol/L (ref 135–145)

## 2017-08-03 LAB — GLUCOSE, CAPILLARY: GLUCOSE-CAPILLARY: 73 mg/dL (ref 65–99)

## 2017-08-03 MED ORDER — METOPROLOL TARTRATE 25 MG PO TABS: 12.5000 mg | ORAL_TABLET | Freq: Two times a day (BID) | ORAL | 3 refills | Status: DC

## 2017-08-03 MED ORDER — METOPROLOL TARTRATE 25 MG PO TABS
12.5000 mg | ORAL_TABLET | Freq: Two times a day (BID) | ORAL | Status: DC
  Administered 2017-08-03: 12.5 mg via ORAL
  Filled 2017-08-03: qty 1

## 2017-08-03 MED ORDER — ATORVASTATIN CALCIUM 40 MG PO TABS: 40.0000 mg | ORAL_TABLET | Freq: Every day | ORAL | 3 refills | Status: DC

## 2017-08-03 NOTE — Progress Notes (Signed)
Discharged to home with his wife.  Prescription e-scribed to [facility].  Instructions given re care of cath site, and symptoms to monitor. Lengthy instructions regarding taking Plavix, and the risks of bleeding and what to do if it occurs.

## 2017-08-03 NOTE — Progress Notes (Addendum)
Progress Note  Patient Name: Johnny Wolfe Date of Encounter: 08/03/2017  Primary Cardiologist: Rockey Situ  Subjective   No chest pain or SOB. Has ambulated without issues. Vitals stable. No dizziness, presyncope, or syncope. Post-cath labs stable.   Inpatient Medications    Scheduled Meds: . aspirin EC  81 mg Oral Daily  . atorvastatin  40 mg Oral q1800  . clopidogrel  75 mg Oral Q breakfast  . insulin aspart  4 Units Subcutaneous TID  . insulin glargine  20 Units Subcutaneous Q2200  . metoprolol tartrate  12.5 mg Oral BID  . pioglitazone  30 mg Oral Daily  . sodium chloride flush  3 mL Intravenous Q12H   Continuous Infusions: . sodium chloride     PRN Meds: sodium chloride, acetaminophen, nitroGLYCERIN, ondansetron (ZOFRAN) IV, sodium chloride flush   Vital Signs    Vitals:   08/02/17 1430 08/02/17 1455 08/02/17 2006 08/03/17 0552  BP:  (!) 150/92 124/69 127/62  Pulse: 76 83 78 73  Resp: 13 18    Temp:  98.7 F (37.1 C) 98 F (36.7 C) 97.7 F (36.5 C)  TempSrc:  Oral Oral Oral  SpO2: 98% 97% 97% 96%  Weight:  171 lb (77.6 kg)    Height:  6' (1.829 m)      Intake/Output Summary (Last 24 hours) at 08/03/2017 0750 Last data filed at 08/03/2017 0552 Gross per 24 hour  Intake 0 ml  Output 950 ml  Net -950 ml   Filed Weights   08/02/17 0851 08/02/17 1455  Weight: 171 lb (77.6 kg) 171 lb (77.6 kg)    Telemetry    NSR with occasional PVCs - Personally Reviewed  ECG    n/a - Personally Reviewed  Physical Exam   GEN: No acute distress.   Neck: No JVD. Cardiac: RRR, no murmurs, rubs, or gallops. Right femoral cardiac cath site with mild ecchymosis without active bleeding. No TTP or bruit. Femoral pulse 2+.  Respiratory: Clear to auscultation bilaterally.  GI: Soft, nontender, non-distended.   MS: No edema; No deformity. Neuro:  Alert and oriented x 3; Nonfocal.  Psych: Normal affect.  Labs    Chemistry Recent Labs  Lab 08/01/17 1432 08/03/17 0356    NA 143 137  K 4.7 4.5  CL 105 106  CO2 22 26  GLUCOSE 50* 96  BUN 17 20  CREATININE 0.97 0.82  CALCIUM 10.1 8.8*  GFRNONAA 79 >60  GFRAA 92 >60  ANIONGAP  --  5     Hematology Recent Labs  Lab 08/01/17 1432 08/03/17 0356  WBC 8.9 5.6  RBC 4.10* 3.55*  HGB 12.4* 11.2*  HCT 39.5 34.0*  MCV 96 95.6  MCH 30.2 31.4  MCHC 31.4* 32.8  RDW 15.1 15.4*  PLT 234 171    Cardiac EnzymesNo results for input(s): TROPONINI in the last 168 hours. No results for input(s): TROPIPOC in the last 168 hours.   BNPNo results for input(s): BNP, PROBNP in the last 168 hours.   DDimer No results for input(s): DDIMER in the last 168 hours.   Radiology    No results found.  Cardiac Studies   LHC 08/02/17: Conclusion    Prox RCA to Mid RCA lesion is 80% stenosed.  Ost Cx to Prox Cx lesion is 60% stenosed.  Mid LAD lesion is 40% stenosed.  Prox LAD lesion is 60% stenosed.  Dist RCA lesion is 30% stenosed.  Ost 2nd Mrg to 2nd Mrg lesion is 40% stenosed.  The left ventricular ejection fraction is 50-55% by visual estimate.  The left ventricular systolic function is normal.  LV end diastolic pressure is normal.     PCI 08/02/17: Conclusion     Prox RCA to Mid RCA lesion is 80% stenosed.  Mid RCA lesion is 30% stenosed.  A drug-eluting stent was successfully placed using a STENT SIERRA 3.50 X 15 MM.  Post intervention, there is a 0% residual stenosis.   Successful angioplasty and drug-eluting stent placement to the proximal RCA.  Recommendations:  Dual antiplatelet therapy for at least 6 months.  Aggressive treatment of risk factors.     Patient Profile     72 y.o. male with history of hypertension, hyperlipidemia, diabetes, remote tobacco abuse, and alcohol abuse who underwent diagnostic LHC 4/5 that showed RCA disease as above.   Assessment & Plan    1. CAD: -Status post PCi/DES as above -DAPT with ASA and Plavix x at least 6 months  (e-scribed) -Simvastatin changed to Lipitor -Add Lopressor -Cardiac rehab -Aggressive risk factor modification  2. Syncope: -Prior history of orthostasis -PCI as above -Monitor with initiation of metoprolol  3. HTN: -Well contolled -Lopressor as above  4. HLD: -Simvastatin changed to Lipitor as above  5. History of hyperkalemia: -Not on ACEi?ARB -Stable  For questions or updates, please contact [place] Please consult [email or web address] for contact info under Cardiology/STEMI.    Signed, Christell Faith, PA-C [place] Pager: [phone] 08/03/2017, 7:50 AM  See my notes on DC summary today  Sherren Mocha 08/03/2017 1:54 PM

## 2017-08-03 NOTE — Plan of Care (Signed)
Patient is progressing in al areas. Patient has no barriers for discharge.

## 2017-08-03 NOTE — Plan of Care (Signed)
Discharge instructions re care of site, follow up, eliquis.

## 2017-08-05 ENCOUNTER — Encounter: Payer: Self-pay | Admitting: Cardiovascular Disease

## 2017-08-05 ENCOUNTER — Telehealth: Payer: Self-pay | Admitting: *Deleted

## 2017-08-05 DIAGNOSIS — R918 Other nonspecific abnormal finding of lung field: Secondary | ICD-10-CM

## 2017-08-05 DIAGNOSIS — L723 Sebaceous cyst: Secondary | ICD-10-CM | POA: Diagnosis not present

## 2017-08-05 NOTE — Telephone Encounter (Signed)
Spoke with patient and reviewed that in follow up we would like him to have CT scan done in 6 months to evaluate his nodule. He verbalized understanding with no further questions at this time.

## 2017-08-05 NOTE — Telephone Encounter (Signed)
-----   Message from Minna Merritts, MD sent at 08/02/2017 12:05 PM EDT ----- Regarding: Chest CT no contrast He is currently in the hospital after stent placement April 5 He needs a chest CT scan with no contrast in 6 months time for lung nodule seen on previous CT scan thx TG

## 2017-08-09 ENCOUNTER — Other Ambulatory Visit: Payer: Self-pay | Admitting: *Deleted

## 2017-08-09 ENCOUNTER — Telehealth: Payer: Self-pay | Admitting: Cardiovascular Disease

## 2017-08-09 DIAGNOSIS — R918 Other nonspecific abnormal finding of lung field: Secondary | ICD-10-CM

## 2017-08-09 NOTE — Telephone Encounter (Signed)
Spoke with patient and he denies any active bleeding to the site and no oozing. He states that he noticed it roughly a day or two after his procedure and that it has not changed in size. Reviewed signs and symptoms to monitor for that would require immediate attention and ED evaluation. He verbalized understanding of our conversation, agreement with plan, and confirmed upcoming appointment. He was very appreciative for the call with no further questions at this time.

## 2017-08-09 NOTE — Telephone Encounter (Signed)
Pt calling stating last Friday he had a stent placed He states on Sunday he noticed a knot around the incision area He states it is red and puffy, about a half in wife and half in deep. He thought it would have been gone by now but is calling to see if he should do something about this  Please call back

## 2017-08-14 ENCOUNTER — Encounter: Payer: Self-pay | Admitting: Cardiovascular Disease

## 2017-08-14 NOTE — Progress Notes (Signed)
Cardiology Office Note  Date:  08/16/2017   ID:  Johnny Wolfe, DOB 09-13-1945, MRN [id number]  PCP:  Kathrine Haddock, NP   Chief Complaint  Patient presents with  . OTHER    Post cardiac cath c/o incision has lump with drainage see telephone note. Meds reviewed verbally with pt.    HPI:  Johnny Wolfe is a 72 old gentleman with past medical history of Diabetes, Hemoglobin A1c 6.3 Hypertension Hyperlipidemia Smoker quit 2013 ETOH Coronary calcium score of 2310. Presenting for follow-up of his syncope and CT coronary calcium scoring And after recent cardiac catheterization with stent to his RCA  Recent symptoms of syncope, markedly elevated CT coronary calcium score leading to cardiac catheterization Cardiac cath 08/02/2017 Severe proximal RCA disease Moderate proximal LAD and LCX disease Mild mid LAD, RCA disease, Vessels are heavily calcified. Normal EF estimated at 55% Stent to the RCA  Since placement of the stent, he has had hematoma right groin I was unaware of hematoma following the procedure, requiring manual compression Reports several days ago having " beeswax" come out of the groin hole, since then no further discharge, only clear There is a small knot, size of a nickel at the catheter site, nontender Hematoma has extended down his inner thigh also nontender He is otherwise ambulating without difficulty  Coronary calcium score of 2310. This was 95 percentile for age and sex matched control. high calcium score.  severe heavy three-vessel disease  mild aortic atherosclerosis  EKG personally reviewed by myself on todays visit Shows normal sinus rhythm rate 62 bpm no significant ST or T wave changes  Other past medical history reviewed Total chol 161, LDL 51 HBA1C 6.3  Syncope: 05/21/2017 On [employer] to bed 7pm Woke up on the floor Remembered went to bathroom, woke up on floor, dark in room Then went to bathroom again,  Feeling for light Woke up  again in the bathtub, little knot on head, Got up out of bathroom,  Little dizzy,  Sugars were 80  Between 1/22 and 2/11 Was on BP medication, lisinopril 5 BP 88/50, running low consistently before lisinopril was stopped Jun 13, 2022    Father died 54s cancer Mother Obese, DM, cad, stent age 19, now age 32, now pacer Brother CAD, PCi    PMH:   has a past medical history of CAD (coronary artery disease), Cataract, Diabetes mellitus without complication ([place]), Essential hypertension, Hyperlipidemia, Syncope, and Wears dentures.  PSH:    Past Surgical History:  Procedure Laterality Date  . APPENDECTOMY    . CATARACT EXTRACTION, BILATERAL     August 2017  . CORONARY STENT INTERVENTION N/A 08/02/2017   Procedure: CORONARY STENT INTERVENTION;  Surgeon: Wellington Hampshire, MD;  Location: [facility];  Service: Cardiovascular;  Laterality: N/A;  . LEFT HEART CATH AND CORONARY ANGIOGRAPHY Left 08/02/2017   Procedure: LEFT HEART CATH AND CORONARY ANGIOGRAPHY;  Surgeon: Minna Merritts, MD;  Location: [facility];  Service: Cardiovascular;  Laterality: Left;  Marland Kitchen MASS EXCISION Left 07/04/2017   Procedure: REMOVAL TWO CYST EAR EAR LOBE AND EXTERNAL EAR;  Surgeon: Beverly Gust, MD;  Location: [facility];  Service: ENT;  Laterality: Left;  LOCAL Diabetic - insulin and oral meds    Current Outpatient Medications  Medication Sig Dispense Refill  . aspirin EC 81 MG tablet Take 81 mg by mouth daily.    Marland Kitchen atorvastatin (LIPITOR) 40 MG tablet Take 1 tablet (40 mg total) by mouth daily  at 6 PM. 90 tablet 3  . clopidogrel (PLAVIX) 75 MG tablet Take 1 tablet (75 mg total) by mouth daily with breakfast. 30 tablet 6  . glucose blood test strip 1 each by Other route as needed for other. One Touch Ultra    . Insulin Glargine (LANTUS SOLOSTAR [place]) Inject 15 Units into the skin daily after breakfast.    . insulin lispro (HUMALOG) 100 UNIT/ML injection Inject 4 Units into the skin 3 (three)  times daily with meals.    . Insulin Pen Needle 31G X 4 MM MISC 1 Units by Does not apply route 2 (two) times daily. 100 each 12  . KOMBIGLYZE XR 2.08-998 MG TB24 TAKE 2 TABLETS BY MOUTH  DAILY 180 tablet 1  . metoprolol tartrate (LOPRESSOR) 25 MG tablet Take 0.5 tablets (12.5 mg total) by mouth 2 (two) times daily. 180 tablet 3  . nitroGLYCERIN (NITROSTAT) 0.4 MG SL tablet Place 1 tablet (0.4 mg total) under the tongue every 5 (five) minutes as needed for chest pain. 25 tablet 3  . ONETOUCH DELICA LANCETS FINE MISC Use TID 100 each 12  . pioglitazone (ACTOS) 30 MG tablet Take 30 mg by mouth daily.     No current facility-administered medications for this visit.      Allergies:   Neosporin [neomycin-bacitracin zn-polymyx]   Social History:  The patient  reports that he quit smoking about 5 years ago. He has never used smokeless tobacco. He reports that he drinks about 4.8 - 8.4 oz of alcohol per week. He reports that he does not use drugs.   Family History:   family history includes Arrhythmia (age of onset: 20) in his mother; Cancer in his father; Diabetes in his mother.    Review of Systems: Review of Systems  Constitutional: Negative.   Respiratory: Negative.   Cardiovascular: Negative.   Gastrointestinal: Negative.   Musculoskeletal: Negative.   Neurological: Negative.   Psychiatric/Behavioral: Negative.   All other systems reviewed and are negative.    PHYSICAL EXAM: VS:  BP 108/64 (BP Location: Left Arm, Patient Position: Sitting, Cuff Size: Normal)   Pulse 64   Ht 6' (1.829 m)   Wt 174 lb 12 oz (79.3 kg)   BMI 23.70 kg/m  , BMI Body mass index is 23.7 kg/m.  Constitutional:  oriented to person, place, and time. No distress.  HENT:  Head: Normocephalic and atraumatic.  Eyes:  no discharge. No scleral icterus.  Neck: Normal range of motion. Neck supple. No JVD present.  Cardiovascular: Normal rate, regular rhythm, normal heart sounds and intact distal pulses. Exam  reveals no gallop and no friction rub. No edema No murmur heard. Pulmonary/Chest: Effort normal and breath sounds normal. No stridor. No respiratory distress.  no wheezes.  no rales.  no tenderness.  Abdominal: Soft.  no distension.  no tenderness.  Musculoskeletal: Normal range of motion.  no  tenderness or deformity.  Small not size of a nickel right groin nontender, bruising noted right inner groin around catheterization site Neurological:  normal muscle tone. Coordination normal. No atrophy Skin: Skin is warm and dry. No rash noted. not diaphoretic.  Psychiatric:  normal mood and affect. behavior is normal. Thought content normal.    Recent Labs: 06/10/2017: ALT 10 08/03/2017: BUN 20; Creatinine, Ser 0.82; Hemoglobin 11.2; Platelets 171; Potassium 4.5; Sodium 137    Lipid Panel Lab Results  Component Value Date   CHOL 161 06/10/2017   HDL 101 06/10/2017   LDLCALC 51 06/10/2017  TRIG 43 06/10/2017      Wt Readings from Last 3 Encounters:  08/16/17 174 lb 12 oz (79.3 kg)  08/02/17 171 lb (77.6 kg)  08/01/17 171 lb 8 oz (77.8 kg)       ASSESSMENT AND PLAN:  Type 2 diabetes mellitus without complication, with long-term current use of insulin ([place]) Managed by primary care,  We have encouraged continued exercise, careful diet management in an effort to lose weight.  Syncope and collapse -  Recent stent placed to RCA We will continue aspirin Plavix for at least one year  Essential hypertension, benign Blood pressure is well controlled on today's visit. No changes made to the medications.  CAD, Stable angina cardiac catheterization April 2019  Results discussed, stent to the proximal RCA Resolving groin hematoma.  Closure device likely did not hold in recovery If he continues to have pain at the site we would order ultrasound For any sign of pussy discharge we would start him on antibiotics  High cholesterol -  Cholesterol is at goal on the current lipid regimen. No  changes to the medications were made.  Hyperkalemia Will avoid ACE inhibitors ARB    Total encounter time more than 25 minutes  Greater than 50% was spent in counseling and coordination of care with the patient    Orders Placed This Encounter  Procedures  . EKG 12-Lead     Signed, Esmond Plants, M.D., Ph.D. 08/16/2017  [place], [place]

## 2017-08-16 ENCOUNTER — Ambulatory Visit: Payer: Medicare Other | Admitting: Cardiovascular Disease

## 2017-08-16 ENCOUNTER — Encounter: Payer: Self-pay | Admitting: Cardiovascular Disease

## 2017-08-16 VITALS — BP 108/64 | HR 64 | Ht 72.0 in | Wt 174.8 lb

## 2017-08-16 DIAGNOSIS — Z794 Long term (current) use of insulin: Secondary | ICD-10-CM | POA: Diagnosis not present

## 2017-08-16 DIAGNOSIS — I10 Essential (primary) hypertension: Secondary | ICD-10-CM

## 2017-08-16 DIAGNOSIS — I25118 Atherosclerotic heart disease of native coronary artery with other forms of angina pectoris: Secondary | ICD-10-CM

## 2017-08-16 DIAGNOSIS — E7800 Pure hypercholesterolemia, unspecified: Secondary | ICD-10-CM | POA: Diagnosis not present

## 2017-08-16 DIAGNOSIS — E119 Type 2 diabetes mellitus without complications: Secondary | ICD-10-CM

## 2017-08-16 DIAGNOSIS — R55 Syncope and collapse: Secondary | ICD-10-CM | POA: Diagnosis not present

## 2017-08-16 MED ORDER — CLOPIDOGREL BISULFATE 75 MG PO TABS: 75.0000 mg | ORAL_TABLET | Freq: Every day | ORAL | 4 refills | Status: DC

## 2017-08-16 NOTE — Patient Instructions (Signed)
Please monitor the groin Call if it does not get smaller Or if you get discharge  We might needs ABX or ultrasound  Medication Instructions:   No medication changes made  Labwork:  No new labs needed  Testing/Procedures:  No further testing at this time   Follow-Up: It was a pleasure seeing you in the office today. Please call us if you have new issues that need to be addressed before your next appt.  [phone]  Your physician wants you to follow-up in: 12 months.  You will receive a reminder letter in the mail two months in advance. If you don't receive a letter, please call our office to schedule the follow-up appointment.  If you need a refill on your cardiac medications before your next appointment, please call your pharmacy.  For educational health videos Log in to : [email or web address] Or : [email or web address], password : [email or web address]

## 2017-08-27 ENCOUNTER — Telehealth: Payer: Self-pay | Admitting: Unknown Physician Specialty

## 2017-08-27 MED ORDER — INSULIN GLARGINE 100 UNIT/ML SOLOSTAR PEN: 15.0000 [IU] | PEN_INJECTOR | Freq: Every day | SUBCUTANEOUS | 11 refills | Status: DC

## 2017-08-27 NOTE — Telephone Encounter (Signed)
Copied from [place] [id number]. Topic: Quick Communication - Rx Refill/Question >> Aug 27, 2017 10:46 AM Synthia Innocent wrote: Medication: Jerral Ralph 5x3ML Has the patient contacted their pharmacy? Yes.   (Agent: If no, request that the patient contact the pharmacy for the refill.) Preferred Pharmacy (with phone number or street name):[facility] Agent: Please be advised that RX refills may take up to 3 business days. We ask that you follow-up with your pharmacy.  REF# [id number]

## 2017-08-27 NOTE — Telephone Encounter (Signed)
Request for refill of Lantus Solostar, previously filled by historical provider.  LOV: 07/16/17 Regino Schultze  [facility]

## 2017-08-29 ENCOUNTER — Ambulatory Visit: Payer: Medicare Other | Admitting: Cardiovascular Disease

## 2017-09-11 ENCOUNTER — Other Ambulatory Visit: Payer: Self-pay

## 2017-09-11 MED ORDER — INSULIN LISPRO 100 UNIT/ML ~~LOC~~ SOLN: 4.0000 [IU] | Freq: Three times a day (TID) | SUBCUTANEOUS | 2 refills | Status: DC

## 2017-09-11 NOTE — Telephone Encounter (Signed)
Patient last seen 07/16/17 and has appointment 09/16/17.

## 2017-09-16 ENCOUNTER — Ambulatory Visit: Payer: Medicare Other | Admitting: Unknown Physician Specialty

## 2017-09-16 ENCOUNTER — Encounter: Payer: Self-pay | Admitting: Unknown Physician Specialty

## 2017-09-16 VITALS — BP 133/75 | HR 64 | Wt 179.9 lb

## 2017-09-16 DIAGNOSIS — E875 Hyperkalemia: Secondary | ICD-10-CM

## 2017-09-16 DIAGNOSIS — I10 Essential (primary) hypertension: Secondary | ICD-10-CM

## 2017-09-16 DIAGNOSIS — E7800 Pure hypercholesterolemia, unspecified: Secondary | ICD-10-CM | POA: Diagnosis not present

## 2017-09-16 DIAGNOSIS — E119 Type 2 diabetes mellitus without complications: Secondary | ICD-10-CM

## 2017-09-16 DIAGNOSIS — I2510 Atherosclerotic heart disease of native coronary artery without angina pectoris: Secondary | ICD-10-CM

## 2017-09-16 LAB — BAYER DCA HB A1C WAIVED: HB A1C (BAYER DCA - WAIVED): 5.8 % (ref <7.0)

## 2017-09-16 NOTE — Progress Notes (Signed)
BP 133/75   Pulse 64   Wt 179 lb 14.4 oz (81.6 kg)   SpO2 96%   BMI 24.40 kg/m    Subjective:    Patient ID: Johnny Wolfe, male    DOB: 1945-06-11, 72 y.o.   MRN: [id number]  HPI: Johnny Wolfe is a 72 y.o. male  Chief Complaint  Patient presents with  . Follow-up  . Cough    Resolved  . Diabetes   Diabetes: Using medications without difficulties.  Takes 14 u of Lantus.  Takes Humalog about 3-4 units with meals.   No hypoglycemic episodes No hyperglycemic episodes Feet problems: Blood Sugars averaging: 100-110.  Occasionally it is higher.   eye exam within last year Last Hgb A1C: 6.4  Hypertension  Not hypertensive.  Put on ACE for kidney protection but developed hyperkalemia.  On Lopressor following stent.    Elevated Cholesterol Using medications without problems No Muscle aches  Diet: Exercise: No change  Pt was referred to cardiology, note reviewed, for some dizzyness.  W/U with cardiology resulted in stent placement  Relevant past medical, surgical, family and social history reviewed and updated as indicated. Interim medical history since our last visit reviewed. Allergies and medications reviewed and updated.  Review of Systems  Per HPI unless specifically indicated above     Objective:    BP 133/75   Pulse 64   Wt 179 lb 14.4 oz (81.6 kg)   SpO2 96%   BMI 24.40 kg/m   Wt Readings from Last 3 Encounters:  09/16/17 179 lb 14.4 oz (81.6 kg)  08/16/17 174 lb 12 oz (79.3 kg)  08/02/17 171 lb (77.6 kg)    Physical Exam  Constitutional: He is oriented to person, place, and time. He appears well-developed and well-nourished. No distress.  HENT:  Head: Normocephalic and atraumatic.  Eyes: Conjunctivae and lids are normal. Right eye exhibits no discharge. Left eye exhibits no discharge. No scleral icterus.  Neck: Normal range of motion. Neck supple. No JVD present. Carotid bruit is not present.  Cardiovascular: Normal rate, regular rhythm and normal  heart sounds.  Pulmonary/Chest: Effort normal and breath sounds normal. No respiratory distress.  Abdominal: Normal appearance. There is no splenomegaly or hepatomegaly.  Musculoskeletal: Normal range of motion.  Neurological: He is alert and oriented to person, place, and time.  Skin: Skin is warm, dry and intact. No rash noted. No pallor.  Psychiatric: He has a normal mood and affect. His behavior is normal. Judgment and thought content normal.    Results for orders placed or performed during the hospital encounter of 08/02/17  Glucose, capillary  Result Value Ref Range   Glucose-Capillary 92 65 - 99 mg/dL  Glucose, capillary  Result Value Ref Range   Glucose-Capillary 54 (L) 65 - 99 mg/dL  Glucose, capillary  Result Value Ref Range   Glucose-Capillary 79 65 - 99 mg/dL  Glucose, capillary  Result Value Ref Range   Glucose-Capillary 158 (H) 65 - 99 mg/dL  Glucose, capillary  Result Value Ref Range   Glucose-Capillary 224 (H) 65 - 99 mg/dL   Comment 1 Notify RN   Basic metabolic panel  Result Value Ref Range   Sodium 137 135 - 145 mmol/L   Potassium 4.5 3.5 - 5.1 mmol/L   Chloride 106 101 - 111 mmol/L   CO2 26 22 - 32 mmol/L   Glucose, Bld 96 65 - 99 mg/dL   BUN 20 6 - 20 mg/dL   Creatinine, Ser 0.82 0.61 -  1.24 mg/dL   Calcium 8.8 (L) 8.9 - 10.3 mg/dL   GFR calc non Af Amer >60 >60 mL/min   GFR calc Af Amer >60 >60 mL/min   Anion gap 5 5 - 15  CBC  Result Value Ref Range   WBC 5.6 3.8 - 10.6 K/uL   RBC 3.55 (L) 4.40 - 5.90 MIL/uL   Hemoglobin 11.2 (L) 13.0 - 18.0 g/dL   HCT 34.0 (L) 40.0 - 52.0 %   MCV 95.6 80.0 - 100.0 fL   MCH 31.4 26.0 - 34.0 pg   MCHC 32.8 32.0 - 36.0 g/dL   RDW 15.4 (H) 11.5 - 14.5 %   Platelets 171 150 - 440 K/uL  Glucose, capillary  Result Value Ref Range   Glucose-Capillary 62 (L) 65 - 99 mg/dL  Glucose, capillary  Result Value Ref Range   Glucose-Capillary 86 65 - 99 mg/dL  Glucose, capillary  Result Value Ref Range    Glucose-Capillary 73 65 - 99 mg/dL  POCT Activated clotting time  Result Value Ref Range   Activated Clotting Time 400 seconds      Assessment & Plan:   Problem List Items Addressed This Visit      Unprioritized   CAD (coronary artery disease), native coronary artery   Relevant Orders   Lipid Panel w/o Chol/HDL Ratio   Essential hypertension, benign   High cholesterol    Tolerating Atorvastatin 40 mg.  Check lipid panel      Hyperkalemia    Check DMP today      Relevant Orders   Comprehensive metabolic panel   Type 2 diabetes mellitus without complication (HCC) - Primary    Hgb A1C is 5.8% Encouraged to monitor for hypoglycemia      Relevant Orders   Bayer Blanket Hb A1c Waived       Follow up plan: Return in about 6 months (around 03/19/2018).

## 2017-09-16 NOTE — Assessment & Plan Note (Signed)
Hgb A1C is 5.8% Encouraged to monitor for hypoglycemia

## 2017-09-16 NOTE — Assessment & Plan Note (Signed)
Tolerating Atorvastatin 40 mg.  Check lipid panel

## 2017-09-16 NOTE — Assessment & Plan Note (Signed)
Check DMP today

## 2017-09-17 ENCOUNTER — Encounter: Payer: Self-pay | Admitting: Unknown Physician Specialty

## 2017-09-17 ENCOUNTER — Other Ambulatory Visit: Payer: Self-pay | Admitting: Unknown Physician Specialty

## 2017-09-17 DIAGNOSIS — E875 Hyperkalemia: Secondary | ICD-10-CM

## 2017-09-17 LAB — COMPREHENSIVE METABOLIC PANEL
ALK PHOS: 89 IU/L (ref 39–117)
ALT: 8 IU/L (ref 0–44)
AST: 19 IU/L (ref 0–40)
Albumin/Globulin Ratio: 2.1 (ref 1.2–2.2)
Albumin: 4 g/dL (ref 3.6–4.8)
BILIRUBIN TOTAL: 0.5 mg/dL (ref 0.0–1.2)
BUN/Creatinine Ratio: 18 (ref 10–24)
BUN: 16 mg/dL (ref 8–27)
CHLORIDE: 104 mmol/L (ref 96–106)
CO2: 21 mmol/L (ref 20–29)
CREATININE: 0.88 mg/dL (ref 0.76–1.27)
Calcium: 9.2 mg/dL (ref 8.6–10.2)
GFR calc Af Amer: 101 mL/min/{1.73_m2} (ref >59)
GFR calc non Af Amer: 88 mL/min/{1.73_m2} (ref >59)
Globulin, Total: 1.9 g/dL (ref 1.5–4.5)
Glucose: 72 mg/dL (ref 65–99)
Potassium: 5.5 mmol/L — ABNORMAL HIGH (ref 3.5–5.2)
Sodium: 139 mmol/L (ref 134–144)
Total Protein: 5.9 g/dL — ABNORMAL LOW (ref 6.0–8.5)

## 2017-09-17 LAB — LIPID PANEL W/O CHOL/HDL RATIO
CHOLESTEROL TOTAL: 120 mg/dL (ref 100–199)
HDL: 73 mg/dL (ref >39)
LDL CALC: 36 mg/dL (ref 0–99)
TRIGLYCERIDES: 53 mg/dL (ref 0–149)
VLDL CHOLESTEROL CAL: 11 mg/dL (ref 5–40)

## 2017-09-17 NOTE — Progress Notes (Signed)
Pt notified through mychart.

## 2017-10-04 ENCOUNTER — Other Ambulatory Visit: Payer: Self-pay | Admitting: Unknown Physician Specialty

## 2017-10-04 NOTE — Telephone Encounter (Signed)
Pioglitazone (Actos) refill Last Refill:02/28/17- not an active med on med list Last OV: 09/16/17 PCP: Kathrine Haddock NP Pharmacy:[facility]

## 2017-10-14 ENCOUNTER — Ambulatory Visit: Payer: Medicare Other | Admitting: Family Medicine

## 2017-10-14 ENCOUNTER — Encounter: Payer: Self-pay | Admitting: Family Medicine

## 2017-10-14 VITALS — BP 121/70 | HR 62 | Temp 98.0°F | Wt 180.0 lb

## 2017-10-14 DIAGNOSIS — J029 Acute pharyngitis, unspecified: Secondary | ICD-10-CM | POA: Diagnosis not present

## 2017-10-14 DIAGNOSIS — E875 Hyperkalemia: Secondary | ICD-10-CM

## 2017-10-14 DIAGNOSIS — R0982 Postnasal drip: Secondary | ICD-10-CM | POA: Diagnosis not present

## 2017-10-14 MED ORDER — FLUTICASONE PROPIONATE 50 MCG/ACT NA SUSP: 2.0000 | Freq: Every day | NASAL | 6 refills | Status: DC

## 2017-10-14 NOTE — Assessment & Plan Note (Signed)
Rechecking levels today. Await results.  

## 2017-10-14 NOTE — Progress Notes (Signed)
BP 121/70 (BP Location: Left Arm, Patient Position: Sitting, Cuff Size: Normal)   Pulse 62   Temp 98 F (36.7 C)   Wt 180 lb (81.6 kg)   SpO2 97%   BMI 24.41 kg/m    Subjective:    Patient ID: Johnny Wolfe, male    DOB: [birth year], 72 y.o.   MRN: [id number]  HPI: Johnny Wolfe is a 72 y.o. male  Chief Complaint  Patient presents with  . Sore Throat    No fever or headache   UPPER RESPIRATORY TRACT INFECTION Duration: 9 days Worst symptom: sore throat Fever: no Cough: yes Shortness of breath: no Wheezing: no Chest pain: no Chest tightness: no Chest congestion: no Nasal congestion: yes Runny nose: yes Post nasal drip: yes Sneezing: no Sore throat: yes Swollen glands: no Sinus pressure: no Headache: no Face pain: no Toothache: no Ear pain: no  Ear pressure: no  Eyes red/itching:no Eye drainage/crusting: no  Vomiting: no Rash: no Fatigue: no Sick contacts: no Strep contacts: no  Context: stable Recurrent sinusitis: no Relief with OTC cold/cough medications: no  Treatments attempted: cough syrup    Relevant past medical, surgical, family and social history reviewed and updated as indicated. Interim medical history since our last visit reviewed. Allergies and medications reviewed and updated.  Review of Systems  Constitutional: Negative.   HENT: Positive for congestion. Negative for dental problem, drooling, ear discharge, ear pain, facial swelling, hearing loss and mouth sores.   Respiratory: Negative.   Cardiovascular: Negative.   Psychiatric/Behavioral: Negative.     Per HPI unless specifically indicated above     Objective:    BP 121/70 (BP Location: Left Arm, Patient Position: Sitting, Cuff Size: Normal)   Pulse 62   Temp 98 F (36.7 C)   Wt 180 lb (81.6 kg)   SpO2 97%   BMI 24.41 kg/m   Wt Readings from Last 3 Encounters:  10/14/17 180 lb (81.6 kg)  09/16/17 179 lb 14.4 oz (81.6 kg)  08/16/17 174 lb 12 oz (79.3 kg)    Physical Exam   Constitutional: He is oriented to person, place, and time. He appears well-developed and well-nourished.  Non-toxic appearance. He does not appear ill. No distress.  HENT:  Head: Normocephalic and atraumatic.  Right Ear: Hearing, tympanic membrane and ear canal normal. No drainage, swelling or tenderness. No middle ear effusion.  Left Ear: Hearing, tympanic membrane and ear canal normal. No drainage, swelling or tenderness.  No middle ear effusion.  Nose: Nose normal.  Mouth/Throat: Uvula is midline and oropharynx is clear and moist. Mucous membranes are pale, not dry and not cyanotic. No oral lesions. No uvula swelling. No oropharyngeal exudate, posterior oropharyngeal edema, posterior oropharyngeal erythema or tonsillar abscesses. No tonsillar exudate.  Eyes: Pupils are equal, round, and reactive to light. Conjunctivae, EOM and lids are normal. Right eye exhibits no discharge. Left eye exhibits no discharge. No scleral icterus.  Neck: Normal range of motion. Neck supple. No thyromegaly present.  Cardiovascular: Normal rate, regular rhythm, normal heart sounds and intact distal pulses. Exam reveals no gallop.  No murmur heard. Pulmonary/Chest: Effort normal and breath sounds normal. No stridor. No respiratory distress. He has no wheezes. He has no rhonchi. He has no rales. He exhibits no tenderness.  Musculoskeletal: Normal range of motion.  Lymphadenopathy:    He has cervical adenopathy.  Neurological: He is alert and oriented to person, place, and time.  Skin: Skin is warm, dry and intact. Capillary refill takes  less than 2 seconds. No rash noted. He is not diaphoretic. No erythema. No pallor.  Psychiatric: He has a normal mood and affect. His speech is normal and behavior is normal. Judgment and thought content normal. Cognition and memory are normal.  Nursing note and vitals reviewed.   Results for orders placed or performed in visit on 09/16/17  Comprehensive metabolic panel  Result  Value Ref Range   Glucose 72 65 - 99 mg/dL   BUN 16 8 - 27 mg/dL   Creatinine, Ser 0.88 0.76 - 1.27 mg/dL   GFR calc non Af Amer 88 >59 mL/min/1.73   GFR calc Af Amer 101 >59 mL/min/1.73   BUN/Creatinine Ratio 18 10 - 24   Sodium 139 134 - 144 mmol/L   Potassium 5.5 (H) 3.5 - 5.2 mmol/L   Chloride 104 96 - 106 mmol/L   CO2 21 20 - 29 mmol/L   Calcium 9.2 8.6 - 10.2 mg/dL   Total Protein 5.9 (L) 6.0 - 8.5 g/dL   Albumin 4.0 3.6 - 4.8 g/dL   Globulin, Total 1.9 1.5 - 4.5 g/dL   Albumin/Globulin Ratio 2.1 1.2 - 2.2   Bilirubin Total 0.5 0.0 - 1.2 mg/dL   Alkaline Phosphatase 89 39 - 117 IU/L   AST 19 0 - 40 IU/L   ALT 8 0 - 44 IU/L  Lipid Panel w/o Chol/HDL Ratio  Result Value Ref Range   Cholesterol, Total 120 100 - 199 mg/dL   Triglycerides 53 0 - 149 mg/dL   HDL 73 >39 mg/dL   VLDL Cholesterol Cal 11 5 - 40 mg/dL   LDL Calculated 36 0 - 99 mg/dL  Bayer DCA Hb A1c Waived  Result Value Ref Range   HB A1C (BAYER DCA - WAIVED) 5.8 <7.0 %      Assessment & Plan:   Problem List Items Addressed This Visit      Other   Hyperkalemia    Rechecking levels today. Await results.        Other Visit Diagnoses    Post-nasal drip    -  Primary   Will start flonase. Call with any concerns. Continue to monitor.    Sore throat       Strep negative. Likely due to post nasal drip.   Relevant Orders   Rapid Strep Screen ([facility] ONLY)       Follow up plan: Return As scheduled.

## 2017-10-15 ENCOUNTER — Encounter: Payer: Self-pay | Admitting: Family Medicine

## 2017-10-15 LAB — BASIC METABOLIC PANEL
BUN / CREAT RATIO: 21 (ref 10–24)
BUN: 21 mg/dL (ref 8–27)
CO2: 24 mmol/L (ref 20–29)
CREATININE: 1.01 mg/dL (ref 0.76–1.27)
Calcium: 9.4 mg/dL (ref 8.6–10.2)
Chloride: 104 mmol/L (ref 96–106)
GFR calc non Af Amer: 76 mL/min/{1.73_m2} (ref >59)
GFR, EST AFRICAN AMERICAN: 87 mL/min/{1.73_m2} (ref >59)
Glucose: 132 mg/dL — ABNORMAL HIGH (ref 65–99)
Potassium: 4.7 mmol/L (ref 3.5–5.2)
SODIUM: 142 mmol/L (ref 134–144)

## 2017-10-17 LAB — RAPID STREP SCREEN (MED CTR MEBANE ONLY): STREP GP A AG, IA W/REFLEX: NEGATIVE

## 2017-10-17 LAB — CULTURE, GROUP A STREP: Strep A Culture: NEGATIVE

## 2017-11-04 ENCOUNTER — Ambulatory Visit (INDEPENDENT_AMBULATORY_CARE_PROVIDER_SITE_OTHER): Payer: Medicare Other

## 2017-11-04 VITALS — BP 118/62 | HR 65 | Temp 97.8°F | Resp 16 | Ht 73.5 in | Wt 182.2 lb

## 2017-11-04 DIAGNOSIS — Z0000 Encounter for general adult medical examination without abnormal findings: Secondary | ICD-10-CM

## 2017-11-04 NOTE — Progress Notes (Signed)
Subjective:   Johnny Wolfe is a 72 y.o. male who presents for Medicare Annual/Subsequent preventive examination.  Review of Systems:   Cardiac Risk Factors include: advanced age (>48men, >48 women);diabetes mellitus;dyslipidemia;hypertension;male gender;smoking/ tobacco exposure     Objective:    Vitals: BP 118/62 (BP Location: Left Arm, Patient Position: Sitting)   Pulse 65   Temp 97.8 F (36.6 C) (Temporal)   Resp 16   Ht 6' 1.5" (1.867 m)   Wt 182 lb 3.2 oz (82.6 kg)   BMI 23.71 kg/m   Body mass index is 23.71 kg/m.  Advanced Directives 11/04/2017 08/02/2017 08/02/2017 07/04/2017 10/30/2016  Does Patient Have a Medical Advance Directive? Yes - No Yes Yes  Type of Advance Directive Living will;Healthcare Power of [place];Living will [place];Living will  Does patient want to make changes to medical advance directive? - - - No - Patient declined -  Copy of [place] in Chart? No - copy requested - - No - copy requested No - copy requested  Would patient like information on creating a medical advance directive? - No - Patient declined - - -    Tobacco Social History   Tobacco Use  Smoking Status Former Smoker  . Packs/day: 1.00  . Last attempt to quit: 10/27/2011  . Years since quitting: 6.0  Smokeless Tobacco Never Used     Counseling given: Not Answered   Clinical Intake:  Pre-visit preparation completed: Yes  Pain : No/denies pain Pain Score: 0-No pain     Nutritional Status: BMI of 19-24  Normal Nutritional Risks: None Diabetes: Yes CBG done?: No Did pt. bring in CBG monitor from home?: No  How often do you need to have someone help you when you read instructions, pamphlets, or other written materials from your doctor or pharmacy?: 1 - Never What is the last grade level you completed in school?: associates degree   Interpreter Needed?: No  Information entered by :: Tiffany Hill,LPN    Past Medical History:  Diagnosis Date  . CAD (coronary artery disease)    a. 06/2017 Cardiac CT: Ca2+ score of 2310 - 95th%'ile;  b. 07/2017 Cath/PCI: LM nl, LAD 60p, 24m, LCX 60p, OM2 40p, RCA dominant, 80p (3.5x15 [place] DES), 82m, 30d, EF 55-65%.  . Cataract   . Diabetes mellitus without complication ([place])   . Essential hypertension   . Hyperlipidemia   . Syncope   . Wears dentures    full upper and lower   Past Surgical History:  Procedure Laterality Date  . APPENDECTOMY    . CATARACT EXTRACTION, BILATERAL     August 2017  . CORONARY STENT INTERVENTION N/A 08/02/2017   Procedure: CORONARY STENT INTERVENTION;  Surgeon: Wellington Hampshire, MD;  Location: [facility];  Service: Cardiovascular;  Laterality: N/A;  . LEFT HEART CATH AND CORONARY ANGIOGRAPHY Left 08/02/2017   Procedure: LEFT HEART CATH AND CORONARY ANGIOGRAPHY;  Surgeon: Minna Merritts, MD;  Location: [facility];  Service: Cardiovascular;  Laterality: Left;  Marland Kitchen MASS EXCISION Left 07/04/2017   Procedure: REMOVAL TWO CYST EAR EAR LOBE AND EXTERNAL EAR;  Surgeon: Beverly Gust, MD;  Location: [facility];  Service: ENT;  Laterality: Left;  LOCAL Diabetic - insulin and oral meds   Family History  Problem Relation Age of Onset  . Diabetes Mother   . Arrhythmia Mother 37       Pacemaker implant   .  Cancer Father        lung   Social History   Socioeconomic History  . Marital status: Married    Spouse name: Not on file  . Number of children: Not on file  . Years of education: Not on file  . Highest education level: Not on file  Occupational History  . Not on file  Social Needs  . Financial resource strain: Not hard at all  . Food insecurity:    Worry: Never true    Inability: Never true  . Transportation needs:    Medical: No    Non-medical: No  Tobacco Use  . Smoking status: Former Smoker    Packs/day: 1.00    Last attempt to quit: 10/27/2011    Years since quitting: 6.0  .  Smokeless tobacco: Never Used  Substance and Sexual Activity  . Alcohol use: Yes    Alcohol/week: 4.8 - 8.4 oz    Types: 2 - 4 Glasses of wine, 6 - 10 Shots of liquor per week  . Drug use: No  . Sexual activity: Never  Lifestyle  . Physical activity:    Days per week: 0 days    Minutes per session: 0 min  . Stress: Not at all  Relationships  . Social connections:    Talks on phone: More than three times a week    Gets together: More than three times a week    Attends religious service: Never    Active member of club or organization: No    Attends meetings of clubs or organizations: Never    Relationship status: Married  Other Topics Concern  . Not on file  Social History Narrative  . Not on file    Outpatient Encounter Medications as of 11/04/2017  Medication Sig  . aspirin EC 81 MG tablet Take 81 mg by mouth daily.  Marland Kitchen atorvastatin (LIPITOR) 40 MG tablet Take 1 tablet (40 mg total) by mouth daily at 6 PM.  . clopidogrel (PLAVIX) 75 MG tablet Take 1 tablet (75 mg total) by mouth daily with breakfast.  . fluticasone (FLONASE) 50 MCG/ACT nasal spray Place 2 sprays into both nostrils daily.  Marland Kitchen glucose blood test strip 1 each by Other route as needed for other. One Touch Ultra  . Insulin Glargine (LANTUS SOLOSTAR) 100 UNIT/ML Solostar Pen Inject 15 Units into the skin daily after breakfast.  . insulin lispro (HUMALOG) 100 UNIT/ML injection Inject 0.04 mLs (4 Units total) into the skin 3 (three) times daily with meals.  . Insulin Pen Needle 31G X 4 MM MISC 1 Units by Does not apply route 2 (two) times daily.  Marland Kitchen KOMBIGLYZE XR 2.08-998 MG TB24 TAKE 2 TABLETS BY MOUTH  DAILY  . metoprolol tartrate (LOPRESSOR) 25 MG tablet Take 0.5 tablets (12.5 mg total) by mouth 2 (two) times daily.  Glory Rosebush DELICA LANCETS FINE MISC Use TID  . pioglitazone (ACTOS) 30 MG tablet TAKE 1 TABLET BY MOUTH  DAILY  . nitroGLYCERIN (NITROSTAT) 0.4 MG SL tablet Place 1 tablet (0.4 mg total) under the tongue  every 5 (five) minutes as needed for chest pain. (Patient not taking: Reported on 11/04/2017)   No facility-administered encounter medications on file as of 11/04/2017.     Activities of Daily Living In your present state of health, do you have any difficulty performing the following activities: 11/04/2017 08/03/2017  Hearing? N -  Vision? N -  Difficulty concentrating or making decisions? N -  Walking or climbing stairs?  N -  Dressing or bathing? N -  Doing errands, shopping? N N  Preparing Food and eating ? N -  Using the Toilet? N -  In the past six months, have you accidently leaked urine? N -  Do you have problems with loss of bowel control? N -  Managing your Medications? N -  Managing your Finances? N -  Housekeeping or managing your Housekeeping? N -  Some recent data might be hidden    Patient Care Team: Kathrine Haddock, NP as PCP - General (Nurse Practitioner) Minna Merritts, MD as PCP - Cardiology (Cardiology)   Assessment:   This is a routine wellness examination for [place].  Exercise Activities and Dietary recommendations Current Exercise Habits: The patient does not participate in regular exercise at present, Exercise limited by: None identified  Goals    . DIET - INCREASE WATER INTAKE     Recommend drinking at least 6-8 glasses of water a day        Fall Risk Fall Risk  11/04/2017 09/16/2017 10/30/2016  Falls in the past year? No No No   Is the patient's home free of loose throw rugs in walkways, pet beds, electrical cords, etc?   yes      Grab bars in the bathroom? no      Handrails on the stairs?   yes      Adequate lighting?   yes  Timed Get Up and Go Performed: Completed in 8 seconds with no use of assistive devices, steady gait. No intervention needed at this time.   Depression Screen PHQ 2/9 Scores 11/04/2017 09/16/2017 10/30/2016 10/19/2016  PHQ - 2 Score 0 0 0 0  PHQ- 9 Score - - 2 -    Cognitive Function     6CIT Screen 11/04/2017  What Year? 0 points    What month? 0 points  What time? 0 points  Count back from 20 0 points  Months in reverse 0 points  Repeat phrase 0 points  Total Score 0    Immunization History  Administered Date(s) Administered  . Influenza, High Dose Seasonal PF 05/10/2016, 03/15/2017  . Influenza-Unspecified 03/15/2017  . Pneumococcal Conjugate-13 10/30/2013  . Pneumococcal Polysaccharide-23 06/10/2017  . Td 10/31/2010  . Zoster 10/30/2013    Qualifies for Shingles Vaccine? Yes, discussed shingrix vaccine   Screening Tests Health Maintenance  Topic Date Due  . FOOT EXAM  10/19/2017  . URINE MICROALBUMIN  10/19/2017  . INFLUENZA VACCINE  11/28/2017  . OPHTHALMOLOGY EXAM  03/14/2018  . HEMOGLOBIN A1C  03/19/2018  . TETANUS/TDAP  10/30/2020  . COLONOSCOPY  10/28/2024  . Hepatitis C Screening  Completed  . PNA vac Low Risk Adult  Completed   Cancer Screenings: Lung: Low Dose CT Chest recommended if Age 46-80 years, 30 pack-year currently smoking OR have quit w/in 15years. Patient does qualify. CT scan scheduled for 02/10/2018 Colorectal: completed 10/29/2014  Additional Screenings: Hepatitis C Screening:completed 06/10/2017      Plan:    I have personally reviewed and addressed the Medicare Annual Wellness questionnaire and have noted the following in the patient's chart:  A. Medical and social history B. Use of alcohol, tobacco or illicit drugs  C. Current medications and supplements D. Functional ability and status E.  Nutritional status F.  Physical activity G. Advance directives H. List of other physicians I.  Hospitalizations, surgeries, and ER visits in previous 12 months J.  [place] such as hearing and vision if needed, cognitive  and depression L. Referrals and appointments   In addition, I have reviewed and discussed with patient certain preventive protocols, quality metrics, and best practice recommendations. A written personalized care plan for preventive services as well  as general preventive health recommendations were provided to patient.   Signed,  Tyler Aas, LPN Nurse Health Advisor   Nurse Notes: requests humalog quickpen generic rx sent to [facility].

## 2017-11-04 NOTE — Patient Instructions (Signed)
Johnny Wolfe , Thank you for taking time to come for your Medicare Wellness Visit. I appreciate your ongoing commitment to your health goals. Please review the following plan we discussed and let me know if I can assist you in the future.   Screening recommendations/referrals: Colonoscopy: completed 10/29/2014 Recommended yearly ophthalmology/optometry visit for glaucoma screening and checkup Recommended yearly dental visit for hygiene and checkup  Vaccinations: Influenza vaccine: due 12/2017 Pneumococcal vaccine: completed series Tdap vaccine: up to date Shingles vaccine: shingrix eligible, check with your insurance company for coverage   Advanced directives: copy brought today.   Conditions/risks identified: Recommend drinking at least 6-8 glasses of water a day    Next appointment: Follow up in one year for your annual wellness exam.   Preventive Care 70 Years and Older, Male Preventive care refers to lifestyle choices and visits with your health care provider that can promote health and wellness. What does preventive care include?  A yearly physical exam. This is also called an annual well check.  Dental exams once or twice a year.  Routine eye exams. Ask your health care provider how often you should have your eyes checked.  Personal lifestyle choices, including:  Daily care of your teeth and gums.  Regular physical activity.  Eating a healthy diet.  Avoiding tobacco and drug use.  Limiting alcohol use.  Practicing safe sex.  Taking low doses of aspirin every day.  Taking vitamin and mineral supplements as recommended by your health care provider. What happens during an annual well check? The services and screenings done by your health care provider during your annual well check will depend on your age, overall health, lifestyle risk factors, and family history of disease. Counseling  Your health care provider may ask you questions about your:  Alcohol  use.  Tobacco use.  Drug use.  Emotional well-being.  Home and relationship well-being.  Sexual activity.  Eating habits.  History of falls.  Memory and ability to understand (cognition).  Work and work Statistician. Screening  You may have the following tests or measurements:  Height, weight, and BMI.  Blood pressure.  Lipid and cholesterol levels. These may be checked every 5 years, or more frequently if you are over 69 years old.  Skin check.  Lung cancer screening. You may have this screening every year starting at age 70 if you have a 30-pack-year history of smoking and currently smoke or have quit within the past 15 years.  Fecal occult blood test (FOBT) of the stool. You may have this test every year starting at age 70.  Flexible sigmoidoscopy or colonoscopy. You may have a sigmoidoscopy every 5 years or a colonoscopy every 10 years starting at age 70.  Prostate cancer screening. Recommendations will vary depending on your family history and other risks.  Hepatitis C blood test.  Hepatitis B blood test.  Sexually transmitted disease (STD) testing.  Diabetes screening. This is done by checking your blood sugar (glucose) after you have not eaten for a while (fasting). You may have this done every 1-3 years.  Abdominal aortic aneurysm (AAA) screening. You may need this if you are a current or former smoker.  Osteoporosis. You may be screened starting at age 42 if you are at high risk. Talk with your health care provider about your test results, treatment options, and if necessary, the need for more tests. Vaccines  Your health care provider may recommend certain vaccines, such as:  Influenza vaccine. This is recommended every year.  Tetanus, diphtheria, and acellular pertussis (Tdap, Td) vaccine. You may need a Td booster every 10 years.  Zoster vaccine. You may need this after age 70.  Pneumococcal 13-valent conjugate (PCV13) vaccine. One dose is  recommended after age 70.  Pneumococcal polysaccharide (PPSV23) vaccine. One dose is recommended after age 70. Talk to your health care provider about which screenings and vaccines you need and how often you need them. This information is not intended to replace advice given to you by your health care provider. Make sure you discuss any questions you have with your health care provider. Document Released: 05/13/2015 Document Revised: 01/04/2016 Document Reviewed: 02/15/2015 Elsevier Interactive Patient Education  2017 [place] Prevention in the Home Falls can cause injuries. They can happen to people of all ages. There are many things you can do to make your home safe and to help prevent falls. What can I do on the outside of my home?  Regularly fix the edges of walkways and driveways and fix any cracks.  Remove anything that might make you trip as you walk through a door, such as a raised step or threshold.  Trim any bushes or trees on the path to your home.  Use bright outdoor lighting.  Clear any walking paths of anything that might make someone trip, such as rocks or tools.  Regularly check to see if handrails are loose or broken. Make sure that both sides of any steps have handrails.  Any raised decks and porches should have guardrails on the edges.  Have any leaves, snow, or ice cleared regularly.  Use sand or salt on walking paths during winter.  Clean up any spills in your garage right away. This includes oil or grease spills. What can I do in the bathroom?  Use night lights.  Install grab bars by the toilet and in the tub and shower. Do not use towel bars as grab bars.  Use non-skid mats or decals in the tub or shower.  If you need to sit down in the shower, use a plastic, non-slip stool.  Keep the floor dry. Clean up any water that spills on the floor as soon as it happens.  Remove soap buildup in the tub or shower regularly.  Attach bath mats  securely with double-sided non-slip rug tape.  Do not have throw rugs and other things on the floor that can make you trip. What can I do in the bedroom?  Use night lights.  Make sure that you have a light by your bed that is easy to reach.  Do not use any sheets or blankets that are too big for your bed. They should not hang down onto the floor.  Have a firm chair that has side arms. You can use this for support while you get dressed.  Do not have throw rugs and other things on the floor that can make you trip. What can I do in the kitchen?  Clean up any spills right away.  Avoid walking on wet floors.  Keep items that you use a lot in easy-to-reach places.  If you need to reach something above you, use a strong step stool that has a grab bar.  Keep electrical cords out of the way.  Do not use floor polish or wax that makes floors slippery. If you must use wax, use non-skid floor wax.  Do not have throw rugs and other things on the floor that can make you trip. What can I do  with my stairs?  Do not leave any items on the stairs.  Make sure that there are handrails on both sides of the stairs and use them. Fix handrails that are broken or loose. Make sure that handrails are as long as the stairways.  Check any carpeting to make sure that it is firmly attached to the stairs. Fix any carpet that is loose or worn.  Avoid having throw rugs at the top or bottom of the stairs. If you do have throw rugs, attach them to the floor with carpet tape.  Make sure that you have a light switch at the top of the stairs and the bottom of the stairs. If you do not have them, ask someone to add them for you. What else can I do to help prevent falls?  Wear shoes that:  Do not have high heels.  Have rubber bottoms.  Are comfortable and fit you well.  Are closed at the toe. Do not wear sandals.  If you use a stepladder:  Make sure that it is fully opened. Do not climb a closed  stepladder.  Make sure that both sides of the stepladder are locked into place.  Ask someone to hold it for you, if possible.  Clearly mark and make sure that you can see:  Any grab bars or handrails.  First and last steps.  Where the edge of each step is.  Use tools that help you move around (mobility aids) if they are needed. These include:  Canes.  Walkers.  Scooters.  Crutches.  Turn on the lights when you go into a dark area. Replace any light bulbs as soon as they burn out.  Set up your furniture so you have a clear path. Avoid moving your furniture around.  If any of your floors are uneven, fix them.  If there are any pets around you, be aware of where they are.  Review your medicines with your doctor. Some medicines can make you feel dizzy. This can increase your chance of falling. Ask your doctor what other things that you can do to help prevent falls. This information is not intended to replace advice given to you by your health care provider. Make sure you discuss any questions you have with your health care provider. Document Released: 02/10/2009 Document Revised: 09/22/2015 Document Reviewed: 05/21/2014 Elsevier Interactive Patient Education  2017 [employer].

## 2017-11-06 ENCOUNTER — Encounter: Payer: Self-pay | Admitting: Unknown Physician Specialty

## 2017-11-06 ENCOUNTER — Ambulatory Visit (INDEPENDENT_AMBULATORY_CARE_PROVIDER_SITE_OTHER): Payer: Medicare Other | Admitting: Unknown Physician Specialty

## 2017-11-06 VITALS — BP 129/75 | HR 64 | Temp 97.6°F | Ht 73.5 in | Wt 180.6 lb

## 2017-11-06 DIAGNOSIS — Z7189 Other specified counseling: Secondary | ICD-10-CM | POA: Insufficient documentation

## 2017-11-06 DIAGNOSIS — Z794 Long term (current) use of insulin: Secondary | ICD-10-CM

## 2017-11-06 DIAGNOSIS — L308 Other specified dermatitis: Secondary | ICD-10-CM | POA: Diagnosis not present

## 2017-11-06 DIAGNOSIS — E119 Type 2 diabetes mellitus without complications: Secondary | ICD-10-CM | POA: Diagnosis not present

## 2017-11-06 DIAGNOSIS — Z0000 Encounter for general adult medical examination without abnormal findings: Secondary | ICD-10-CM

## 2017-11-06 DIAGNOSIS — L989 Disorder of the skin and subcutaneous tissue, unspecified: Secondary | ICD-10-CM | POA: Diagnosis not present

## 2017-11-06 MED ORDER — INSULIN LISPRO 100 UNIT/ML (KWIKPEN): 4.0000 [IU] | PEN_INJECTOR | Freq: Three times a day (TID) | SUBCUTANEOUS | 11 refills | Status: DC

## 2017-11-06 NOTE — Progress Notes (Signed)
BP 129/75   Pulse 64   Temp 97.6 F (36.4 C) (Oral)   Ht 6' 1.5" (1.867 m)   Wt 180 lb 9.6 oz (81.9 kg)   SpO2 98%   BMI 23.50 kg/m    Subjective:    Patient ID: Johnny Wolfe, male    DOB: 1945/05/27, 72 y.o.   MRN: [id number]  HPI: Johnny Wolfe is a 72 y.o. male  Chief Complaint  Patient presents with  . Annual Exam    pt had wellness exam 11/04/17   CAD Stent 08/02/2017. Started on Atorvastatin.  Pt feels well.  Hypokalemia Resolved with stopping Lisinopril.     Diabetes Pt is taking 14 u of Lantus.  Taking Humalog 3-4 with lunch and dinner.  No hypoglycemia.  Last Hgb A1C was 5.8%  Skin issues Leflt palmar surface, area of redness 2-3 months.  Also dry and scaley today but not typical.  Rt temple enlarging scaley lesion  Social History   Socioeconomic History  . Marital status: Married    Spouse name: Not on file  . Number of children: Not on file  . Years of education: Not on file  . Highest education level: Not on file  Occupational History  . Not on file  Social Needs  . Financial resource strain: Not hard at all  . Food insecurity:    Worry: Never true    Inability: Never true  . Transportation needs:    Medical: No    Non-medical: No  Tobacco Use  . Smoking status: Former Smoker    Packs/day: 1.00    Last attempt to quit: 10/27/2011    Years since quitting: 6.0  . Smokeless tobacco: Never Used  Substance and Sexual Activity  . Alcohol use: Yes    Alcohol/week: 4.8 - 8.4 oz    Types: 2 - 4 Glasses of wine, 6 - 10 Shots of liquor per week  . Drug use: No  . Sexual activity: Never  Lifestyle  . Physical activity:    Days per week: 0 days    Minutes per session: 0 min  . Stress: Not at all  Relationships  . Social connections:    Talks on phone: More than three times a week    Gets together: More than three times a week    Attends religious service: Never    Active member of club or organization: No    Attends meetings of clubs or  organizations: Never    Relationship status: Married  . Intimate partner violence:    Fear of current or ex partner: No    Emotionally abused: No    Physically abused: No    Forced sexual activity: No  Other Topics Concern  . Not on file  Social History Narrative  . Not on file   Family History  Problem Relation Age of Onset  . Diabetes Mother   . Arrhythmia Mother 98       Pacemaker implant   . Cancer Father        lung   Past Medical History:  Diagnosis Date  . CAD (coronary artery disease)    a. 06/2017 Cardiac CT: Ca2+ score of 2310 - 95th%'ile;  b. 07/2017 Cath/PCI: LM nl, LAD 60p, 17m, LCX 60p, OM2 40p, RCA dominant, 80p (3.5x15 [place] DES), 57m, 30d, EF 55-65%.  . Cataract   . Diabetes mellitus without complication ([facility] [place])   . Essential hypertension   . Hyperlipidemia   . Syncope   .  Wears dentures    full upper and lower   Past Surgical History:  Procedure Laterality Date  . APPENDECTOMY    . CATARACT EXTRACTION, BILATERAL     August 2017  . CORONARY STENT INTERVENTION N/A 08/02/2017   Procedure: CORONARY STENT INTERVENTION;  Surgeon: Wellington Hampshire, MD;  Location: [facility];  Service: Cardiovascular;  Laterality: N/A;  . LEFT HEART CATH AND CORONARY ANGIOGRAPHY Left 08/02/2017   Procedure: LEFT HEART CATH AND CORONARY ANGIOGRAPHY;  Surgeon: Minna Merritts, MD;  Location: [facility];  Service: Cardiovascular;  Laterality: Left;  Marland Kitchen MASS EXCISION Left 07/04/2017   Procedure: REMOVAL TWO CYST EAR EAR LOBE AND EXTERNAL EAR;  Surgeon: Beverly Gust, MD;  Location: [facility];  Service: ENT;  Laterality: Left;  LOCAL Diabetic - insulin and oral meds     Relevant past medical, surgical, family and social history reviewed and updated as indicated. Interim medical history since our last visit reviewed. Allergies and medications reviewed and updated.  Review of Systems  Per HPI unless specifically indicated above     Objective:    BP  129/75   Pulse 64   Temp 97.6 F (36.4 C) (Oral)   Ht 6' 1.5" (1.867 m)   Wt 180 lb 9.6 oz (81.9 kg)   SpO2 98%   BMI 23.50 kg/m   Wt Readings from Last 3 Encounters:  11/06/17 180 lb 9.6 oz (81.9 kg)  11/04/17 182 lb 3.2 oz (82.6 kg)  10/14/17 180 lb (81.6 kg)    Physical Exam  Constitutional: He is oriented to person, place, and time. He appears well-developed and well-nourished.  HENT:  Head: Normocephalic.  Right Ear: Tympanic membrane, external ear and ear canal normal.  Left Ear: Tympanic membrane, external ear and ear canal normal.  Mouth/Throat: Uvula is midline, oropharynx is clear and moist and mucous membranes are normal.  Eyes: Pupils are equal, round, and reactive to light.  Cardiovascular: Normal rate, regular rhythm and normal heart sounds. Exam reveals no gallop and no friction rub.  No murmur heard. Pulmonary/Chest: Effort normal and breath sounds normal. No respiratory distress.  Abdominal: Soft. Bowel sounds are normal. He exhibits no distension. There is no tenderness.  Genitourinary: Rectum normal and prostate normal.  Musculoskeletal: Normal range of motion.  Neurological: He is alert and oriented to person, place, and time. He has normal reflexes.  Skin: Skin is warm.  Scaley lesion on right temple  Left palmar surface scaley and dry  Psychiatric: He has a normal mood and affect. His behavior is normal. Judgment and thought content normal.   Shared decision making and elected to not get PSA bblood test todayb  Results for orders placed or performed in visit on 10/14/17  Rapid Strep Screen ([facility] ONLY)  Result Value Ref Range   Strep Gp A Ag, IA W/Reflex Negative Negative  Culture, Group A Strep  Result Value Ref Range   Strep A Culture Negative   Basic Metabolic Panel (BMET)  Result Value Ref Range   Glucose 132 (H) 65 - 99 mg/dL   BUN 21 8 - 27 mg/dL   Creatinine, Ser 1.01 0.76 - 1.27 mg/dL   GFR calc non Af Amer 76 >59 mL/min/1.73   GFR  calc Af Amer 87 >59 mL/min/1.73   BUN/Creatinine Ratio 21 10 - 24   Sodium 142 134 - 144 mmol/L   Potassium 4.7 3.5 - 5.2 mmol/L   Chloride 104 96 - 106  mmol/L   CO2 24 20 - 29 mmol/L   Calcium 9.4 8.6 - 10.2 mg/dL      Assessment & Plan:   Problem List Items Addressed This Visit      Unprioritized   Advanced care planning/counseling discussion    A voluntary discussion about advance care planning including the explanation and discussion of advance directives was extensively discussed  with the patient.  Explanation about the health care proxy and Living will was reviewed and packet with forms with explanation of how to fill them out was given.  During this discussion, the patient was able to identify a health care proxy as wife and son as secondary and just filled out out brought in.         Type 2 diabetes mellitus without complication (HCC)   Relevant Medications   insulin lispro (HUMALOG) 100 UNIT/ML KiwkPen   Other Relevant Orders   Microalbumin, Urine Waived    Other Visit Diagnoses    Annual physical exam    -  Primary   Other eczema       left hand.  Rx for Aristocort   Skin lesion       Schedule shave bx       Follow up plan: Return for f/u November for chronic disease.

## 2017-11-06 NOTE — Patient Instructions (Signed)
Preventive Care 70 Years and Older, Male Preventive care refers to lifestyle choices and visits with your health care provider that can promote health and wellness. What does preventive care include?  A yearly physical exam. This is also called an annual well check.  Dental exams once or twice a year.  Routine eye exams. Ask your health care provider how often you should have your eyes checked.  Personal lifestyle choices, including: ? Daily care of your teeth and gums. ? Regular physical activity. ? Eating a healthy diet. ? Avoiding tobacco and drug use. ? Limiting alcohol use. ? Practicing safe sex. ? Taking low doses of aspirin every day. ? Taking vitamin and mineral supplements as recommended by your health care provider. What happens during an annual well check? The services and screenings done by your health care provider during your annual well check will depend on your age, overall health, lifestyle risk factors, and family history of disease. Counseling Your health care provider may ask you questions about your:  Alcohol use.  Tobacco use.  Drug use.  Emotional well-being.  Home and relationship well-being.  Sexual activity.  Eating habits.  History of falls.  Memory and ability to understand (cognition).  Work and work environment.  Screening You may have the following tests or measurements:  Height, weight, and BMI.  Blood pressure.  Lipid and cholesterol levels. These may be checked every 5 years, or more frequently if you are over 50 years old.  Skin check.  Lung cancer screening. You may have this screening every year starting at age 55 if you have a 30-pack-year history of smoking and currently smoke or have quit within the past 15 years.  Fecal occult blood test (FOBT) of the stool. You may have this test every year starting at age 50.  Flexible sigmoidoscopy or colonoscopy. You may have a sigmoidoscopy every 5 years or a colonoscopy every 10  years starting at age 50.  Prostate cancer screening. Recommendations will vary depending on your family history and other risks.  Hepatitis C blood test.  Hepatitis B blood test.  Sexually transmitted disease (STD) testing.  Diabetes screening. This is done by checking your blood sugar (glucose) after you have not eaten for a while (fasting). You may have this done every 1-3 years.  Abdominal aortic aneurysm (AAA) screening. You may need this if you are a current or former smoker.  Osteoporosis. You may be screened starting at age 70 if you are at high risk.  Talk with your health care provider about your test results, treatment options, and if necessary, the need for more tests. Vaccines Your health care provider may recommend certain vaccines, such as:  Influenza vaccine. This is recommended every year.  Tetanus, diphtheria, and acellular pertussis (Tdap, Td) vaccine. You may need a Td booster every 10 years.  Varicella vaccine. You may need this if you have not been vaccinated.  Zoster vaccine. You may need this after age 60.  Measles, mumps, and rubella (MMR) vaccine. You may need at least one dose of MMR if you were born in 1957 or later. You may also need a second dose.  Pneumococcal 13-valent conjugate (PCV13) vaccine. One dose is recommended after age 70.  Pneumococcal polysaccharide (PPSV23) vaccine. One dose is recommended after age 70.  Meningococcal vaccine. You may need this if you have certain conditions.  Hepatitis A vaccine. You may need this if you have certain conditions or if you travel or work in places where you   may be exposed to hepatitis A.  Hepatitis B vaccine. You may need this if you have certain conditions or if you travel or work in places where you may be exposed to hepatitis B.  Haemophilus influenzae type b (Hib) vaccine. You may need this if you have certain risk factors.  Talk to your health care provider about which screenings and vaccines  you need and how often you need them. This information is not intended to replace advice given to you by your health care provider. Make sure you discuss any questions you have with your health care provider. Document Released: 05/13/2015 Document Revised: 01/04/2016 Document Reviewed: 02/15/2015 Elsevier Interactive Patient Education  2018 Elsevier Inc.  

## 2017-11-06 NOTE — Assessment & Plan Note (Signed)
A voluntary discussion about advance care planning including the explanation and discussion of advance directives was extensively discussed  with the patient.  Explanation about the health care proxy and Living will was reviewed and packet with forms with explanation of how to fill them out was given.  During this discussion, the patient was able to identify a health care proxy as wife and son as secondary and just filled out out brought in.

## 2017-11-07 LAB — MICROALBUMIN, URINE WAIVED
Creatinine, Urine Waived: 100 mg/dL (ref 10–300)
MICROALB, UR WAIVED: 30 mg/L — AB (ref 0–19)
Microalb/Creat Ratio: <30 mg/g (ref <30)

## 2017-11-07 MED ORDER — BETAMETHASONE DIPROPIONATE AUG 0.05 % EX CREA: TOPICAL_CREAM | Freq: Two times a day (BID) | CUTANEOUS | 0 refills | Status: DC

## 2017-11-19 ENCOUNTER — Ambulatory Visit: Payer: Medicare Other | Admitting: Unknown Physician Specialty

## 2017-11-19 ENCOUNTER — Encounter: Payer: Self-pay | Admitting: Unknown Physician Specialty

## 2017-11-19 VITALS — BP 117/72 | HR 65 | Temp 97.9°F | Ht 73.5 in | Wt 181.8 lb

## 2017-11-19 DIAGNOSIS — L989 Disorder of the skin and subcutaneous tissue, unspecified: Secondary | ICD-10-CM | POA: Diagnosis not present

## 2017-11-19 DIAGNOSIS — B078 Other viral warts: Secondary | ICD-10-CM | POA: Diagnosis not present

## 2017-11-19 NOTE — Progress Notes (Signed)
   There were no vitals taken for this visit.   Subjective:    Patient ID: Johnny Wolfe, male    DOB: Feb 03, 1946, 72 y.o.   MRN: [id number]  HPI: Johnny Wolfe is a 72 y.o. male  No chief complaint on file.  Pt here for new skin lesion right temple which is getting bigger.    Relevant past medical, surgical, family and social history reviewed and updated as indicated. Interim medical history since our last visit reviewed. Allergies and medications reviewed and updated.  Review of Systems  Per HPI unless specifically indicated above     Objective:    There were no vitals taken for this visit.  Wt Readings from Last 3 Encounters:  11/06/17 180 lb 9.6 oz (81.9 kg)  11/04/17 182 lb 3.2 oz (82.6 kg)  10/14/17 180 lb (81.6 kg)    Physical Exam  Constitutional: He is oriented to person, place, and time. He appears well-developed and well-nourished. No distress.  HENT:  Head: Normocephalic and atraumatic.  Eyes: Conjunctivae and lids are normal. Right eye exhibits no discharge. Left eye exhibits no discharge. No scleral icterus.  Cardiovascular: Normal rate.  Pulmonary/Chest: Effort normal.  Abdominal: Normal appearance. There is no splenomegaly or hepatomegaly.  Musculoskeletal: Normal range of motion.  Neurological: He is alert and oriented to person, place, and time.  Skin: Skin is intact. No rash noted. No pallor.  Right temple with scaley white lesion.  After informed consent and pt ed on scarring, area infiltrated with lidocaine with Epinephrine.  Part of the dermis shaved off with # 15 blade.  Sample sent for pathology.  Area cauterized with silver nitrate stick.   Pt tolerated the procedure well   Psychiatric: He has a normal mood and affect. His behavior is normal. Judgment and thought content normal.    Results for orders placed or performed in visit on 11/06/17  Microalbumin, Urine Waived  Result Value Ref Range   Microalb, Ur Waived 30 (H) 0 - 19 mg/L   Creatinine,  Urine Waived 100 10 - 300 mg/dL   Microalb/Creat Ratio <30 <30 mg/g      Assessment & Plan:   Problem List Items Addressed This Visit    None    Visit Diagnoses    Skin lesion of scalp    -  Primary   Shave Bx performed.  Will call with results   Relevant Orders   Pathology       Follow up plan: Return if symptoms worsen or fail to improve.

## 2017-11-20 ENCOUNTER — Other Ambulatory Visit: Payer: Self-pay | Admitting: Unknown Physician Specialty

## 2017-11-22 LAB — PATHOLOGY

## 2017-11-22 NOTE — Telephone Encounter (Signed)
One touch ultra test strip refill Last Refill: -(no rx information, prescribe date or provider Last OV: 11/06/17 PCP: Kathrine Haddock NP Pharmacy:[facility] [phone] [address]

## 2017-11-25 NOTE — Progress Notes (Signed)
Normal labs.  Patient notified by letter.

## 2017-11-26 ENCOUNTER — Ambulatory Visit: Payer: Medicare Other | Admitting: Unknown Physician Specialty

## 2017-11-26 ENCOUNTER — Encounter: Payer: Self-pay | Admitting: Unknown Physician Specialty

## 2017-11-26 DIAGNOSIS — L570 Actinic keratosis: Secondary | ICD-10-CM | POA: Insufficient documentation

## 2017-11-26 NOTE — Assessment & Plan Note (Signed)
Treated with 1 freeze cycle.  Pt ed to keep covered with vaseline and protect from sun.  Pt knows he may require another freeze cycle

## 2017-11-26 NOTE — Progress Notes (Signed)
   BP 129/71   Pulse 66   Temp 97.9 F (36.6 C) (Oral)   Ht 6' 1.5" (1.867 m)   Wt 184 lb 12.8 oz (83.8 kg)   SpO2 95%   BMI 24.05 kg/m    Subjective:    Patient ID: Johnny Wolfe, male    DOB: 11-May-1945, 72 y.o.   MRN: [id number]  HPI: Johnny Wolfe is a 72 y.o. male  Chief Complaint  Patient presents with  . 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  Here to get area cryoed for resolution.    Relevant past medical, surgical, family and social history reviewed and updated as indicated. Interim medical history since our last visit reviewed. Allergies and medications reviewed and updated.  Review of Systems  Per HPI unless specifically indicated above     Objective:    BP 129/71   Pulse 66   Temp 97.9 F (36.6 C) (Oral)   Ht 6' 1.5" (1.867 m)   Wt 184 lb 12.8 oz (83.8 kg)   SpO2 95%   BMI 24.05 kg/m   Wt Readings from Last 3 Encounters:  11/26/17 184 lb 12.8 oz (83.8 kg)  11/19/17 181 lb 12.8 oz (82.5 kg)  11/06/17 180 lb 9.6 oz (81.9 kg)    Physical Exam  Constitutional: He is oriented to person, place, and time. He appears well-developed and well-nourished. No distress.  HENT:  Head: Normocephalic and atraumatic.  Eyes: Conjunctivae and lids are normal. Right eye exhibits no discharge. Left eye exhibits no discharge. No scleral icterus.  Pulmonary/Chest: Effort normal.  Abdominal: Normal appearance. There is no splenomegaly or hepatomegaly.  Musculoskeletal: Normal range of motion.  Neurological: He is alert and oriented to person, place, and time.  Skin: Skin is intact. No rash noted. No pallor.  scaley area right temple cyoed with 1 freeze cycle.  Psychiatric: He has a normal mood and affect. His behavior is normal. Judgment and thought content normal.     Assessment & Plan:   Problem List Items Addressed This Visit      Unprioritized   Keratosis    Treated with 1 freeze cycle.  Pt ed to keep covered with vaseline and protect  from sun.  Pt knows he may require another freeze cycle          Follow up plan: Return if symptoms worsen or fail to improve.

## 2018-02-10 ENCOUNTER — Other Ambulatory Visit: Payer: Self-pay | Admitting: Unknown Physician Specialty

## 2018-02-10 ENCOUNTER — Inpatient Hospital Stay: Admission: RE | Admit: 2018-02-10 | Payer: Medicare Other | Source: Ambulatory Visit

## 2018-02-10 NOTE — Telephone Encounter (Signed)
Requested Prescriptions  Pending Prescriptions Disp Refills  . pioglitazone (ACTOS) 30 MG tablet [Pharmacy Med Name: PIOGLITAZONE  30MG   TAB] 90 tablet 1    Sig: TAKE 1 TABLET BY MOUTH  DAILY     Endocrinology:  Diabetes - Glitazones - pioglitazone Passed - 02/10/2018  5:20 AM      Passed - HBA1C is between 0 and 7.9 and within 180 days    No results found for: HGBA1C       Passed - Valid encounter within last 6 months    Recent Outpatient Visits          2 months ago [place] Kathrine Haddock, NP   2 months ago Skin lesion of scalp   [facility] Kathrine Haddock, NP   3 months ago Annual physical exam   [facility] Kathrine Haddock, NP   3 months ago Post-nasal drip   [place], Megan P, DO   4 months ago Type 2 diabetes mellitus without complication, without long-term current use of insulin ([place])   [facility] [place] Kathrine Haddock, NP      Future Appointments            In 1 month Cannady, Barbaraann Faster, NP [facility]   In 89 months  [facility]

## 2018-03-19 ENCOUNTER — Ambulatory Visit: Payer: Medicare Other | Admitting: Unknown Physician Specialty

## 2018-03-19 ENCOUNTER — Ambulatory Visit: Payer: Self-pay | Admitting: Nurse Practitioner

## 2018-04-14 ENCOUNTER — Ambulatory Visit (INDEPENDENT_AMBULATORY_CARE_PROVIDER_SITE_OTHER)
Admission: RE | Admit: 2018-04-14 | Discharge: 2018-04-14 | Disposition: A | Discharge status: Home / Self Care | Payer: Medicare Other | Source: Ambulatory Visit | Attending: Cardiovascular Disease | Admitting: Cardiovascular Disease

## 2018-04-14 DIAGNOSIS — R918 Other nonspecific abnormal finding of lung field: Secondary | ICD-10-CM | POA: Diagnosis not present

## 2018-04-14 DIAGNOSIS — J929 Pleural plaque without asbestos: Secondary | ICD-10-CM | POA: Diagnosis not present

## 2018-04-16 DIAGNOSIS — E113393 Type 2 diabetes mellitus with moderate nonproliferative diabetic retinopathy without macular edema, bilateral: Secondary | ICD-10-CM | POA: Diagnosis not present

## 2018-04-16 LAB — HM DIABETES EYE EXAM

## 2018-04-22 ENCOUNTER — Telehealth: Payer: Self-pay | Admitting: Cardiovascular Disease

## 2018-04-22 NOTE — Telephone Encounter (Signed)
I have spoken with the patient regarding his repeat chest CT. He is aware of Dr. Donivan Scull impression/ recommendations. The patient states that he has been dealing with a respiratory infection for several weeks and is just now getting over that. I advised I am unsure if that is some of what they are seeing in the right lung on the CT scan. Per the patient, he would like to hold off an any further testing at this time. He is due to see Dr. Rockey Situ in April 2020. I advised they can readdress at that time, or he could call us if he decides he would like a repeat scan done prior. The patient voices understanding of the above and is agreeable.

## 2018-04-22 NOTE — Telephone Encounter (Signed)
Notes recorded by Minna Merritts, MD on 04/20/2018 at 11:41 AM EST CT scan One area resolved, now new area as detailed below We seem to be chasing these nonspecific findings  "Nodular densities seen in left lower lobe on prior exam have Resolved."  "New irregular density is seen posteriorly in the right lower lobe most consistent with focal inflammation or atelectasis. Follow-up CT scan in 4-6 weeks is recommended to ensure resolution and rule out underlying neoplasm.  Stable calcified granulomata are noted in the lower lobes Bilaterally."  Will defer to him whether he was like another scan later. That would make 3 scans in 8 months

## 2018-05-01 ENCOUNTER — Ambulatory Visit (INDEPENDENT_AMBULATORY_CARE_PROVIDER_SITE_OTHER): Payer: Medicare Other | Admitting: Nurse Practitioner

## 2018-05-01 ENCOUNTER — Encounter: Payer: Self-pay | Admitting: Nurse Practitioner

## 2018-05-01 VITALS — BP 116/62 | HR 63 | Temp 97.6°F | Ht 73.0 in | Wt 187.0 lb

## 2018-05-01 DIAGNOSIS — E7800 Pure hypercholesterolemia, unspecified: Secondary | ICD-10-CM

## 2018-05-01 DIAGNOSIS — Z794 Long term (current) use of insulin: Secondary | ICD-10-CM | POA: Diagnosis not present

## 2018-05-01 DIAGNOSIS — E119 Type 2 diabetes mellitus without complications: Secondary | ICD-10-CM | POA: Diagnosis not present

## 2018-05-01 DIAGNOSIS — I10 Essential (primary) hypertension: Secondary | ICD-10-CM | POA: Diagnosis not present

## 2018-05-01 DIAGNOSIS — L209 Atopic dermatitis, unspecified: Secondary | ICD-10-CM | POA: Diagnosis not present

## 2018-05-01 LAB — BAYER DCA HB A1C WAIVED: HB A1C: 6.1 % (ref <7.0)

## 2018-05-01 LAB — LIPID PANEL PICCOLO, WAIVED
Chol/HDL Ratio Piccolo,Waive: 1.9 mg/dL
Cholesterol Piccolo, Waived: 147 mg/dL (ref <200)
HDL Chol Piccolo, Waived: 76 mg/dL (ref >59)
LDL Chol Calc Piccolo Waived: 57 mg/dL (ref <100)
TRIGLYCERIDES PICCOLO,WAIVED: 67 mg/dL (ref <150)
VLDL Chol Calc Piccolo,Waive: 13 mg/dL (ref <30)

## 2018-05-01 MED ORDER — INSULIN LISPRO (1 UNIT DIAL) 100 UNIT/ML (KWIKPEN): 4.0000 [IU] | PEN_INJECTOR | Freq: Two times a day (BID) | SUBCUTANEOUS | 11 refills | Status: DC

## 2018-05-01 MED ORDER — ONETOUCH ULTRASOFT LANCETS MISC: 12 refills | Status: DC

## 2018-05-01 MED ORDER — GLUCOSE BLOOD VI STRP: ORAL_STRIP | 12 refills | Status: DC

## 2018-05-01 MED ORDER — ZOSTER VAC RECOMB ADJUVANTED 50 MCG/0.5ML IM SUSR: 0.5000 mL | Freq: Once | INTRAMUSCULAR | 0 refills | Status: AC

## 2018-05-01 MED ORDER — METOPROLOL TARTRATE 25 MG PO TABS: 12.5000 mg | ORAL_TABLET | Freq: Two times a day (BID) | ORAL | 3 refills | Status: DC

## 2018-05-01 MED ORDER — METFORMIN HCL 1000 MG PO TABS: 1000.0000 mg | ORAL_TABLET | Freq: Two times a day (BID) | ORAL | 3 refills | Status: DC

## 2018-05-01 MED ORDER — INSULIN GLARGINE 100 UNIT/ML SOLOSTAR PEN: 16.0000 [IU] | PEN_INJECTOR | Freq: Every day | SUBCUTANEOUS | 11 refills | Status: DC

## 2018-05-01 MED ORDER — ATORVASTATIN CALCIUM 40 MG PO TABS: 40.0000 mg | ORAL_TABLET | Freq: Every day | ORAL | 3 refills | Status: DC

## 2018-05-01 NOTE — Assessment & Plan Note (Signed)
Initial BP elevated, with repeat within home monitoring range.  Continue current medication regimen.  Continue to collaborate with cardiology.  Sees Dr. Rockey Situ next in April.

## 2018-05-01 NOTE — Patient Instructions (Signed)
Metformin extended-release tablets  What is this medicine?  METFORMIN (met FOR min) is used to treat type 2 diabetes. It helps to control blood sugar. Treatment is combined with diet and exercise. This medicine can be used alone or with other medicines for diabetes.  This medicine may be used for other purposes; ask your health care provider or pharmacist if you have questions.  COMMON BRAND NAME(S): Fortamet, Glucophage XR, Glumetza  What should I tell my health care provider before I take this medicine?  They need to know if you have any of these conditions:  -anemia  -dehydration  -heart disease  -frequently drink alcohol-containing beverages  -kidney disease  -liver disease  -polycystic ovary syndrome  -serious infection or injury  -vomiting  -an unusual or allergic reaction to metformin, other medicines, foods, dyes, or preservatives  -pregnant or trying to get pregnant  -breast-feeding  How should I use this medicine?  Take this medicine by mouth with a glass of water. Follow the directions on the prescription label. Take this medicine with food. Take your medicine at regular intervals. Do not take your medicine more often than directed. Do not stop taking except on your doctor's advice.  Talk to your pediatrician regarding the use of this medicine in children. Special care may be needed.  Overdosage: If you think you have taken too much of this medicine contact a poison control center or emergency room at once.  NOTE: This medicine is only for you. Do not share this medicine with others.  What if I miss a dose?  If you miss a dose, take it as soon as you can. If it is almost time for your next dose, take only that dose. Do not take double or extra doses.  What may interact with this medicine?  Do not take this medicine with any of the following medications:  -certain contrast medicines given before X-rays, CT scans, MRI, or other procedures  -dofetilide  This medicine may also interact with the following  medications:  -acetazolamide  -alcohol  -certain antivirals for HIV or hepatitis  -certain medicines for blood pressure, heart disease, irregular heart beat  -cimetidine  -dichlorphenamide  -digoxin  -diuretics  -male hormones, like estrogens or progestins and birth control pills  -glycopyrrolate  -isoniazid  -lamotrigine  -memantine  -methazolamide  -metoclopramide  -midodrine  -niacin  -phenothiazines like chlorpromazine, mesoridazine, prochlorperazine, thioridazine  -phenytoin  -ranolazine  -steroid medicines like prednisone or cortisone  -stimulant medicines for attention disorders, weight loss, or to stay awake  -thyroid medicines  -topiramate  -trospium  -vandetanib  -zonisamide  This list may not describe all possible interactions. Give your health care provider a list of all the medicines, herbs, non-prescription drugs, or dietary supplements you use. Also tell them if you smoke, drink alcohol, or use illegal drugs. Some items may interact with your medicine.  What should I watch for while using this medicine?  Visit your doctor or health care professional for regular checks on your progress.  A test called the HbA1C (A1C) will be monitored. This is a simple blood test. It measures your blood sugar control over the last 2 to 3 months. You will receive this test every 3 to 6 months.  Learn how to check your blood sugar. Learn the symptoms of low and high blood sugar and how to manage them.  Always carry a quick-source of sugar with you in case you have symptoms of low blood sugar. Examples include hard sugar candy or   or exercising more than usual, you might need to  change the dose of your medicine. Do not skip meals. Ask your doctor or health care professional if you should avoid alcohol. Many nonprescription cough and cold products contain sugar or alcohol. These can affect blood sugar. This medicine may cause ovulation in premenopausal women who do not have regular monthly periods. This may increase your chances of becoming pregnant. You should not take this medicine if you become pregnant or think you may be pregnant. Talk with your doctor or health care professional about your birth control options while taking this medicine. Contact your doctor or health care professional right away if you think you are pregnant. The tablet shell for some brands of this medicine does not dissolve. This is normal. The tablet shell may appear whole in the stool. This is not a cause for concern. If you are going to need surgery, a MRI, CT scan, or other procedure, tell your doctor that you are taking this medicine. You may need to stop taking this medicine before the procedure. Wear a medical ID bracelet or chain, and carry a card that describes your disease and details of your medicine and dosage times. This medicine may cause a decrease in folic acid and vitamin B12. You should make sure that you get enough vitamins while you are taking this medicine. Discuss the foods you eat and the vitamins you take with your health care professional. What side effects may I notice from receiving this medicine? Side effects that you should report to your doctor or health care professional as soon as possible: -allergic reactions like skin rash, itching or hives, swelling of the face, lips, or tongue -breathing problems -feeling faint or lightheaded, falls -muscle aches or pains -signs and symptoms of low blood sugar such as feeling anxious, confusion, dizziness, increased hunger, unusually weak or tired, sweating, shakiness, cold, irritable, headache, blurred vision, fast heartbeat, loss of  consciousness -slow or irregular heartbeat -unusual stomach pain or discomfort -unusually tired or weak Side effects that usually do not require medical attention (report to your doctor or health care professional if they continue or are bothersome): -diarrhea -headache -heartburn -metallic taste in mouth -nausea -stomach gas, upset This list may not describe all possible side effects. Call your doctor for medical advice about side effects. You may report side effects to FDA at 1-800-FDA-1088. Where should I keep my medicine? Keep out of the reach of children. Store at room temperature between 15 and 30 degrees C (59 and 86 degrees F). Protect from light. Throw away any unused medicine after the expiration date. NOTE: This sheet is a summary. It may not cover all possible information. If you have questions about this medicine, talk to your doctor, pharmacist, or health care provider.  2019 Elsevier/Gold Standard (2017-05-23 18:58:32)

## 2018-05-01 NOTE — Progress Notes (Signed)
BP 116/62 (BP Location: Left Arm, Patient Position: Sitting)   Pulse 63   Temp 97.6 F (36.4 C) (Oral)   Ht 6\' 1"  (1.854 m)   Wt 187 lb (84.8 kg)   SpO2 98%   BMI 24.67 kg/m    Subjective:    Patient ID: Johnny Wolfe, male    DOB: 10/05/1945, 73 y.o.   MRN: [id number]  HPI: Johnny Wolfe is a 73 y.o. male presents for follow-up   Chief Complaint  Patient presents with  . Diabetes  . Rash    pt states he has a rash on right leg   DIABETES Hypoglycemic episodes:no Polydipsia/polyuria: no Visual disturbance: no Chest pain: no Paresthesias: no Glucose Monitoring: yes  Accucheck frequency: Daily  Fasting glucose: high 80's to 110  Post prandial:  Evening:  Before meals: Taking Insulin?: yes  Long acting insulin: Lantus 16 units daily  Short acting insulin: Humalog 4 units two times a day Blood Pressure Monitoring: rarely Retinal Examination: Up to Date, two weeks ago Foot Exam: Up to Date Pneumovax: Up to Date Influenza: Up to Date Aspirin: yes  HYPERTENSION / HYPERLIPIDEMIA Satisfied with current treatment? yes Duration of hypertension: chronic BP monitoring frequency: rarely BP range: <130/60-70 BP medication side effects: no Duration of hyperlipidemia: chronic Cholesterol medication side effects: no Cholesterol supplements: none Medication compliance: good compliance Aspirin: yes Recent stressors: no Recurrent headaches: no Visual changes: no Palpitations: no Dyspnea: no Chest pain: no Lower extremity edema: no Dizzy/lightheaded: no  RASH ON LEFT LEG: Has been present one month "or so".  Denies pruritus.  He reports he put a "steroid cream on it", states it is going away he is not "sure if it helped it or it is just going away on own".  Started on left elbow initially six months ago, he applied steroid cream and this cleared.  Now on right medial calf, but improving.    Relevant past medical, surgical, family and social history reviewed and updated  as indicated. Interim medical history since our last visit reviewed. Allergies and medications reviewed and updated.  Review of Systems  Constitutional: Negative for activity change, diaphoresis, fatigue and fever.  Respiratory: Negative for cough, chest tightness, shortness of breath and wheezing.   Cardiovascular: Negative for chest pain, palpitations and leg swelling.  Gastrointestinal: Negative for abdominal distention, abdominal pain, constipation, diarrhea, nausea and vomiting.  Endocrine: Negative for cold intolerance, heat intolerance, polydipsia, polyphagia and polyuria.  Musculoskeletal: Negative.   Skin: Positive for rash.  Neurological: Negative for dizziness, syncope, weakness, light-headedness, numbness and headaches.  Psychiatric/Behavioral: Negative.     Per HPI unless specifically indicated above     Objective:    BP 116/62 (BP Location: Left Arm, Patient Position: Sitting)   Pulse 63   Temp 97.6 F (36.4 C) (Oral)   Ht 6\' 1"  (1.854 m)   Wt 187 lb (84.8 kg)   SpO2 98%   BMI 24.67 kg/m   Wt Readings from Last 3 Encounters:  05/01/18 187 lb (84.8 kg)  11/26/17 184 lb 12.8 oz (83.8 kg)  11/19/17 181 lb 12.8 oz (82.5 kg)    Physical Exam Vitals signs and nursing note reviewed.  Constitutional:      Appearance: He is well-developed.  HENT:     Head: Normocephalic and atraumatic.     Right Ear: Hearing normal. No drainage.     Left Ear: Hearing normal. No drainage.     Mouth/Throat:     Pharynx: Uvula midline.  Eyes:     General: Lids are normal.        Right eye: No discharge.        Left eye: No discharge.     Conjunctiva/sclera: Conjunctivae normal.     Pupils: Pupils are equal, round, and reactive to light.  Neck:     Musculoskeletal: Normal range of motion and neck supple.     Thyroid: No thyromegaly.     Vascular: No carotid bruit or JVD.     Trachea: Trachea normal.  Cardiovascular:     Rate and Rhythm: Normal rate and regular rhythm.      Heart sounds: Normal heart sounds, S1 normal and S2 normal. No murmur. No gallop.   Pulmonary:     Effort: Pulmonary effort is normal.     Breath sounds: Normal breath sounds.  Abdominal:     General: Bowel sounds are normal.     Palpations: Abdomen is soft. There is no hepatomegaly or splenomegaly.  Musculoskeletal: Normal range of motion.  Skin:    General: Skin is warm and dry.     Capillary Refill: Capillary refill takes less than 2 seconds.     Comments: Small dry patch to medial right calf with no drainage or erythema.  Neurological:     Mental Status: He is alert and oriented to person, place, and time.     Deep Tendon Reflexes: Reflexes are normal and symmetric.  Psychiatric:        Mood and Affect: Mood normal.        Behavior: Behavior normal.        Thought Content: Thought content normal.        Judgment: Judgment normal.    Diabetic Foot Exam - Simple   Simple Foot Form Visual Inspection No deformities, no ulcerations, no other skin breakdown bilaterally:  Yes Sensation Testing Intact to touch and monofilament testing bilaterally:  Yes Pulse Check Posterior Tibialis and Dorsalis pulse intact bilaterally:  Yes Comments     Results for orders placed or performed in visit on 11/19/17  Pathology  Result Value Ref Range   . Comment    . Comment    . Comment    . Comment    . Comment    . Comment    . Comment       Assessment & Plan:   Problem List Items Addressed This Visit      Cardiovascular and Mediastinum   Essential hypertension, benign    Initial BP elevated, with repeat within home monitoring range.  Continue current medication regimen.  Continue to collaborate with cardiology.  Sees Dr. Rockey Situ next in April.      Relevant Medications   metoprolol tartrate (LOPRESSOR) 25 MG tablet   atorvastatin (LIPITOR) 40 MG tablet     Endocrine   Type 2 diabetes mellitus without complication (HCC) - Primary    A1C 6.1% today.  He wishes to trial plain  Metformin vs Kombiglyze due to cost.  Will switch to plain Metformin and continue other medications in regimen as currently dosed.  Recheck A1C in 3 months.  Continue to monitor FSBS at home.      Relevant Medications   metFORMIN (GLUCOPHAGE) 1000 MG tablet   insulin lispro (HUMALOG) 100 UNIT/ML KwikPen   Insulin Glargine (LANTUS SOLOSTAR) 100 UNIT/ML Solostar Pen   atorvastatin (LIPITOR) 40 MG tablet   Other Relevant Orders   Bayer DCA Hb A1c Waived     Musculoskeletal and Integument  Atopic dermatitis    Continue Betamethasone cream.          Other   High cholesterol    Chronic, stable.  TCHOL 147 and LDL 57 with AST/ALT 18/16.  Continue Atorvastatin.      Relevant Medications   metoprolol tartrate (LOPRESSOR) 25 MG tablet   atorvastatin (LIPITOR) 40 MG tablet   Other Relevant Orders   Lipid Panel Piccolo, Waived       Follow up plan: Return in about 3 months (around 07/31/2018) for T2DM and HTN/HLD.

## 2018-05-01 NOTE — Assessment & Plan Note (Signed)
Continue Betamethasone cream.

## 2018-05-01 NOTE — Assessment & Plan Note (Signed)
A1C 6.1% today.  He wishes to trial plain Metformin vs Kombiglyze due to cost.  Will switch to plain Metformin and continue other medications in regimen as currently dosed.  Recheck A1C in 3 months.  Continue to monitor FSBS at home.

## 2018-05-01 NOTE — Assessment & Plan Note (Signed)
Chronic, stable.  TCHOL 147 and LDL 57 with AST/ALT 18/16.  Continue Atorvastatin.

## 2018-05-07 ENCOUNTER — Other Ambulatory Visit: Payer: Self-pay

## 2018-05-07 MED ORDER — INSULIN LISPRO (1 UNIT DIAL) 100 UNIT/ML (KWIKPEN): 4.0000 [IU] | PEN_INJECTOR | Freq: Two times a day (BID) | SUBCUTANEOUS | 11 refills | Status: DC

## 2018-05-07 NOTE — Telephone Encounter (Signed)
Patient last seen 05/01/18.

## 2018-07-13 ENCOUNTER — Other Ambulatory Visit: Payer: Self-pay | Admitting: Cardiovascular Disease

## 2018-07-13 ENCOUNTER — Other Ambulatory Visit: Payer: Self-pay | Admitting: Unknown Physician Specialty

## 2018-07-31 ENCOUNTER — Ambulatory Visit: Payer: Medicare Other | Admitting: Nurse Practitioner

## 2018-09-30 ENCOUNTER — Other Ambulatory Visit: Payer: Self-pay | Admitting: Cardiovascular Disease

## 2018-10-08 ENCOUNTER — Encounter: Payer: Self-pay | Admitting: Nurse Practitioner

## 2018-10-08 ENCOUNTER — Other Ambulatory Visit: Payer: Self-pay

## 2018-10-08 ENCOUNTER — Ambulatory Visit (INDEPENDENT_AMBULATORY_CARE_PROVIDER_SITE_OTHER): Payer: Medicare Other | Admitting: Nurse Practitioner

## 2018-10-08 VITALS — BP 129/75 | HR 66 | Temp 97.8°F

## 2018-10-08 DIAGNOSIS — E119 Type 2 diabetes mellitus without complications: Secondary | ICD-10-CM

## 2018-10-08 DIAGNOSIS — I10 Essential (primary) hypertension: Secondary | ICD-10-CM | POA: Diagnosis not present

## 2018-10-08 DIAGNOSIS — E7800 Pure hypercholesterolemia, unspecified: Secondary | ICD-10-CM | POA: Diagnosis not present

## 2018-10-08 DIAGNOSIS — Z794 Long term (current) use of insulin: Secondary | ICD-10-CM | POA: Diagnosis not present

## 2018-10-08 LAB — MICROALBUMIN, URINE WAIVED
Creatinine, Urine Waived: 200 mg/dL (ref 10–300)
Microalb, Ur Waived: 80 mg/L — ABNORMAL HIGH (ref 0–19)

## 2018-10-08 LAB — BAYER DCA HB A1C WAIVED: HB A1C (BAYER DCA - WAIVED): 5.6 % (ref <7.0)

## 2018-10-08 NOTE — Assessment & Plan Note (Signed)
Chronic, stable with A1C today 5.6%.  Will start taking Actos 15 MG daily x one week and if sugars continue to be within current range in morning will discontinue, due to past cardiac history would benefit from alternative options in future if needed.  Continue current Lantus dose and hold Novolog if BS low prior to meals.  Continue Metformin.  Goal if to discontinue insulin and use alternate options, such as GLP or SGLT, in future if needed.  Urine micro ALB 80 and A:C 30-300, allergic to ACE.  May be able to trial ARB in future.  Return in 3 months.

## 2018-10-08 NOTE — Patient Instructions (Signed)
Angina  Angina is a very bad discomfort or pain in the chest, neck, or arm. Angina may cause the following symptoms in your chest:  Crushing or squeezing pain. Pain might last for more than a few minutes at a time. Or, it may stop and come back (recur) over a few minutes.  Tightness.  Fullness.  Pressure.  Heaviness.  Some people also have:  Pain in the arms, neck, jaw, or back.  Heartburn or indigestion for no reason.  Shortness of breath.  An upset stomach (nausea).  Sudden cold sweats.  Women and people with diabetes may have other less common symptoms such as:  Feeling tired (fatigue).  Feeling nervous or worried for no reason.  Feeling weak for no reason.  Dizziness or fainting.  Follow these instructions at home:  Medicines  Take over-the-counter and prescription medicines only as told by your doctor.  Do not take these medicines unless your doctor says that you can:  NSAIDs. These include:  Ibuprofen.  Naproxen.  Celecoxib.  Vitamin supplements that have vitamin A, vitamin E, or both.  Hormone therapy that contains estrogen with or without progestin.  Eating and drinking    Eat a heart-healthy diet that includes:  Plenty of fresh fruits and vegetables.  Whole grains.  Lowfat (lean) protein.  Lowfat dairy products.  Work with a diet and nutrition specialist (dietitian) as told by your doctor. This person can help you make healthy food choices.  Follow other instructions from your doctor about eating or drinking restrictions.  Activity  Follow an exercise program that your doctor tells you.  Return to your normal activities as told by your doctor. Ask your doctor what activities are safe for you.  When you feel tired, take a break. Plan breaks if you know you are going to feel tired.  Lifestyle    Do not use any products that contain nicotine or tobacco. This includes cigarettes and e-cigarettes. If you need help quitting, ask your doctor.  If your doctor says you can drink alcohol, limit your drinking to:  0-1  drink a day for women.  0-2 drinks a day for men.  Be aware of how much alcohol is in your drink. In the [place] 1 drink equals to:  12 oz of beer.  5 oz of wine.  1 oz of hard liquor.  General instructions  Stay at a healthy weight. If your doctor tells you to do so, work with him or her to lose weight.  Learn to deal with stress. If you need help, ask your doctor.  Take a depression screening test to see if you are at risk for depression. If you feel depressed, talk with your doctor.  Work with your doctor to manage any other health conditions that you have. These may include diabetes or high blood pressure.  Keep your vaccines up to date. Get a flu shot every year.  Keep all follow-up visits as told by your doctor. This is important.  Get help right away if:  You have pain in your chest, neck, arm, jaw, stomach, or back, and the pain:  Lasts more than a few minutes.  Comes back.  Is very painful.  Comes more often.  Does not get better after you take medicine under your tongue (sublingual nitroglycerin).  You have any of these problems for no reason:  Heartburn, or indigestion.  Sweating a lot.  Shortness of breath.  Trouble breathing.  Feeling sick to your stomach.  Throwing up.    Feeling more tired than usual.  Feeling nervous or worrying more than usual.  Weakness.  Watery poop (diarrhea).  You are suddenly dizzy or light-headed.  You pass out (faint).  These symptoms may be an emergency. Do not wait to see if the symptoms will go away. Get medical help right away. Call your local emergency services (911 in the [place] Do not drive yourself to the hospital.  Summary  Angina is very bad discomfort or pain in the chest, neck, or arm.  Angina may feel like a crushing or squeezing pain in the chest. It may feel like tightness, pressure, fullness, or heaviness in the chest.  Women or people with diabetes may have different symptoms from men, such as feeling nervous, being worried, being weak for no reason, or feeling  tired.  Take medicines only as told by your doctor.  You should eat a heart-healthy diet and follow an exercise program.  This information is not intended to replace advice given to you by your health care provider. Make sure you discuss any questions you have with your health care provider.  Document Released: 10/03/2007 Document Revised: 05/31/2017 Document Reviewed: 05/31/2017  Elsevier Interactive Patient Education  2019 [employer]

## 2018-10-08 NOTE — Assessment & Plan Note (Signed)
Chronic, ongoing.  Continue current medication regimen.  CMP today.  Lipid next visit.

## 2018-10-08 NOTE — Assessment & Plan Note (Signed)
Chronic, ongoing.  BP at goal today and on rare home readings.  Continue current medication regimen and collaboration with cardiology.  Reviewed last cardiology note, has been on DAPT for over one year, will transition to ASA only.  Return in 3 months.

## 2018-10-08 NOTE — Progress Notes (Signed)
BP 129/75   Pulse 66   Temp 97.8 F (36.6 C) (Oral)   SpO2 98%    Subjective:    Patient ID: Johnny Wolfe, male    DOB: 12/05/1945, 73 y.o.   MRN: [id number]  HPI: Johnny Wolfe is a 73 y.o. male  Chief Complaint  Patient presents with  . Diabetes    pt states he has not been taking the Humalog as much because his BS was a little low  . Hyperlipidemia  . Hypertension   DIABETES January A1C was 6.1%.  Continues on Actos, Lantus, Humalog, and Metformin. Hypoglycemic episodes:no Polydipsia/polyuria: no Visual disturbance: no Chest pain: no Paresthesias: no Glucose Monitoring: yes  Accucheck frequency: Daily  Fasting glucose: 90-110, last two mornings 120-130  Post prandial:  Evening:  Before meals: Taking Insulin?: yes  Long acting insulin: Lantus 16 units  Short acting insulin: Humalog 2 - 3 units Blood Pressure Monitoring: rarely Retinal Examination: Up to Date Foot Exam: Up to Date Pneumovax: Up to Date Influenza: Up to Date Aspirin: yes   HYPERTENSION / HYPERLIPIDEMIA Continues on Metoprolol, Plavix, ASA, NTG, and Atorvastatin.  Saw Dr. Rockey Situ one year ago, 08/16/2017.  Is to see him again in one year, this year, he will schedule. On review of note Plavix/ASA for one year only, he reports that Dr. Rockey Situ told him "we could probably stop Plavix after 6 months".  Has not used NTG, no recent chest pain.   Satisfied with current treatment? yes Duration of hypertension: chronic BP monitoring frequency: rarely BP range:  BP medication side effects: no Duration of hyperlipidemia: chronic Cholesterol medication side effects: no Cholesterol supplements: none Medication compliance: good compliance Aspirin: yes Recent stressors: no Recurrent headaches: no Visual changes: no Palpitations: no Dyspnea: no Chest pain: no Lower extremity edema: no Dizzy/lightheaded: no  Relevant past medical, surgical, family and social history reviewed and updated as indicated.  Interim medical history since our last visit reviewed. Allergies and medications reviewed and updated.  Review of Systems  Constitutional: Negative for activity change, diaphoresis, fatigue and fever.  Respiratory: Negative for cough, chest tightness, shortness of breath and wheezing.   Cardiovascular: Negative for chest pain, palpitations and leg swelling.  Gastrointestinal: Negative for abdominal distention, abdominal pain, constipation, diarrhea, nausea and vomiting.  Endocrine: Negative for cold intolerance, heat intolerance, polydipsia, polyphagia and polyuria.  Musculoskeletal: Negative.   Skin: Negative.   Neurological: Negative for dizziness, syncope, weakness, light-headedness, numbness and headaches.  Psychiatric/Behavioral: Negative.    Per HPI unless specifically indicated above     Objective:    BP 129/75   Pulse 66   Temp 97.8 F (36.6 C) (Oral)   SpO2 98%   Wt Readings from Last 3 Encounters:  05/01/18 187 lb (84.8 kg)  11/26/17 184 lb 12.8 oz (83.8 kg)  11/19/17 181 lb 12.8 oz (82.5 kg)    Physical Exam Vitals signs and nursing note reviewed.  Constitutional:      Appearance: He is well-developed.  HENT:     Head: Normocephalic and atraumatic.     Right Ear: Hearing normal. No drainage.     Left Ear: Hearing normal. No drainage.     Mouth/Throat:     Pharynx: Uvula midline.  Eyes:     General: Lids are normal.        Right eye: No discharge.        Left eye: No discharge.     Conjunctiva/sclera: Conjunctivae normal.     Pupils: Pupils are  equal, round, and reactive to light.  Neck:     Musculoskeletal: Normal range of motion and neck supple.     Thyroid: No thyromegaly.     Vascular: No carotid bruit.     Trachea: Trachea normal.  Cardiovascular:     Rate and Rhythm: Normal rate and regular rhythm.     Heart sounds: Normal heart sounds, S1 normal and S2 normal. No murmur. No gallop.   Pulmonary:     Effort: Pulmonary effort is normal.     Breath  sounds: Normal breath sounds.  Abdominal:     General: Bowel sounds are normal.     Palpations: Abdomen is soft. There is no hepatomegaly or splenomegaly.  Musculoskeletal: Normal range of motion.     Right lower leg: No edema.     Left lower leg: No edema.  Skin:    General: Skin is warm and dry.     Capillary Refill: Capillary refill takes less than 2 seconds.     Findings: No rash.  Neurological:     Mental Status: He is alert and oriented to person, place, and time.     Deep Tendon Reflexes: Reflexes are normal and symmetric.  Psychiatric:        Mood and Affect: Mood normal.        Behavior: Behavior normal.        Thought Content: Thought content normal.        Judgment: Judgment normal.    Diabetic Foot Exam - Simple   Simple Foot Form Visual Inspection No deformities, no ulcerations, no other skin breakdown bilaterally:  Yes Sensation Testing Intact to touch and monofilament testing bilaterally:  Yes Pulse Check Posterior Tibialis and Dorsalis pulse intact bilaterally:  Yes Comments     Results for orders placed or performed in visit on 09/18/18  HM DIABETES EYE EXAM  Result Value Ref Range   HM Diabetic Eye Exam No Retinopathy No Retinopathy      Assessment & Plan:   Problem List Items Addressed This Visit      Cardiovascular and Mediastinum   Essential hypertension, benign    Chronic, ongoing.  BP at goal today and on rare home readings.  Continue current medication regimen and collaboration with cardiology.  Reviewed last cardiology note, has been on DAPT for over one year, will transition to ASA only.  Return in 3 months.          Endocrine   Type 2 diabetes mellitus without complication (HCC) - Primary    Chronic, stable with A1C today 5.6%.  Will start taking Actos 15 MG daily x one week and if sugars continue to be within current range in morning will discontinue, due to past cardiac history would benefit from alternative options in future if needed.   Continue current Lantus dose and hold Novolog if BS low prior to meals.  Continue Metformin.  Goal if to discontinue insulin and use alternate options, such as GLP or SGLT, in future if needed.  Urine micro ALB 80 and A:C 30-300, allergic to ACE.  May be able to trial ARB in future.  Return in 3 months.      Relevant Orders   Bayer DCA Hb A1c Waived   Microalbumin, Urine Waived   Comprehensive metabolic panel     Other   High cholesterol    Chronic, ongoing.  Continue current medication regimen.  CMP today.  Lipid next visit.  Follow up plan: Return in about 3 months (around 01/08/2019) for T2DM, HTN/HLD (annual physical).

## 2018-10-09 LAB — COMPREHENSIVE METABOLIC PANEL
ALT: 12 IU/L (ref 0–44)
AST: 19 IU/L (ref 0–40)
Albumin/Globulin Ratio: 2.1 (ref 1.2–2.2)
Albumin: 4.5 g/dL (ref 3.8–4.8)
Alkaline Phosphatase: 92 IU/L (ref 39–117)
BUN/Creatinine Ratio: 20 (ref 10–24)
BUN: 20 mg/dL (ref 8–27)
Bilirubin Total: 0.5 mg/dL (ref 0.0–1.2)
CO2: 23 mmol/L (ref 20–29)
Calcium: 9.9 mg/dL (ref 8.6–10.2)
Chloride: 104 mmol/L (ref 96–106)
Creatinine, Ser: 1 mg/dL (ref 0.76–1.27)
GFR calc Af Amer: 88 mL/min/{1.73_m2} (ref >59)
GFR calc non Af Amer: 76 mL/min/{1.73_m2} (ref >59)
Globulin, Total: 2.1 g/dL (ref 1.5–4.5)
Glucose: 113 mg/dL — ABNORMAL HIGH (ref 65–99)
Potassium: 5.3 mmol/L — ABNORMAL HIGH (ref 3.5–5.2)
Sodium: 142 mmol/L (ref 134–144)
Total Protein: 6.6 g/dL (ref 6.0–8.5)

## 2018-11-10 ENCOUNTER — Ambulatory Visit: Payer: Medicare Other

## 2018-11-23 ENCOUNTER — Other Ambulatory Visit: Payer: Self-pay | Admitting: Unknown Physician Specialty

## 2018-11-23 NOTE — Telephone Encounter (Signed)
Requested Prescriptions  Pending Prescriptions Disp Refills  . pioglitazone (ACTOS) 30 MG tablet [Pharmacy Med Name: PIOGLITAZONE  30MG   TAB] 90 tablet 1    Sig: TAKE 1 TABLET BY MOUTH  DAILY     Endocrinology:  Diabetes - Glitazones - pioglitazone Passed - 11/23/2018  5:25 AM      Passed - HBA1C is between 0 and 7.9 and within 180 days    HB A1C (BAYER DCA - WAIVED)  Date Value Ref Range Status  10/08/2018 5.6 <7.0 % Final    Comment:                                          Diabetic Adult            <7.0                                       Healthy Adult        4.3 - 5.7                                                           (DCCT/NGSP) American Diabetes Association's Summary of Glycemic Recommendations for Adults with Diabetes: Hemoglobin A1c <7.0%. More stringent glycemic goals (A1c <6.0%) may further reduce complications at the cost of increased risk of hypoglycemia.          Passed - Valid encounter within last 6 months    Recent Outpatient Visits          1 month ago Type 2 diabetes mellitus without complication, with long-term current use of insulin ([place])   [place], Leigh T, NP   6 months ago Type 2 diabetes mellitus without complication, with long-term current use of insulin ([place])   [place], Barbaraann Faster, NP   12 months ago [place] Kathrine Haddock, NP   1 year ago Skin lesion of scalp   [facility] Kathrine Haddock, NP   1 year ago Annual physical exam   [facility] Kathrine Haddock, NP

## 2019-01-28 ENCOUNTER — Telehealth: Payer: Self-pay | Admitting: Nurse Practitioner

## 2019-01-28 NOTE — Telephone Encounter (Signed)
Can you call over to heart care and see if he requires referral for this?  He may not, as this may be considered a one year follow-up versus a new referral.  If does not need referral please let patient know and he can then call to schedule appt with Dr. Rockey Situ.

## 2019-01-28 NOTE — Telephone Encounter (Signed)
Called [facility], patient does not need a new referral. They stated that they would reach out to the patient to schedule his appointment.

## 2019-01-28 NOTE — Telephone Encounter (Signed)
Patient requesting referral please send to Minna Merritts, MD cardiology, 1 year follow up from stent placement, please advise patient when referral is placed

## 2019-02-08 ENCOUNTER — Other Ambulatory Visit: Payer: Self-pay | Admitting: Nurse Practitioner

## 2019-02-18 ENCOUNTER — Other Ambulatory Visit: Payer: Self-pay

## 2019-02-18 ENCOUNTER — Ambulatory Visit (INDEPENDENT_AMBULATORY_CARE_PROVIDER_SITE_OTHER): Payer: Medicare Other | Admitting: Nurse Practitioner

## 2019-02-18 ENCOUNTER — Encounter: Payer: Self-pay | Admitting: Nurse Practitioner

## 2019-02-18 ENCOUNTER — Ambulatory Visit: Payer: Medicare Other

## 2019-02-18 ENCOUNTER — Ambulatory Visit: Payer: Self-pay | Admitting: Pharmacist

## 2019-02-18 VITALS — BP 138/79 | HR 68 | Temp 98.1°F | Ht 71.5 in | Wt 172.0 lb

## 2019-02-18 DIAGNOSIS — I25119 Atherosclerotic heart disease of native coronary artery with unspecified angina pectoris: Secondary | ICD-10-CM

## 2019-02-18 DIAGNOSIS — E1169 Type 2 diabetes mellitus with other specified complication: Secondary | ICD-10-CM

## 2019-02-18 DIAGNOSIS — E119 Type 2 diabetes mellitus without complications: Secondary | ICD-10-CM

## 2019-02-18 DIAGNOSIS — Z0000 Encounter for general adult medical examination without abnormal findings: Secondary | ICD-10-CM | POA: Diagnosis not present

## 2019-02-18 DIAGNOSIS — E785 Hyperlipidemia, unspecified: Secondary | ICD-10-CM

## 2019-02-18 DIAGNOSIS — Z794 Long term (current) use of insulin: Secondary | ICD-10-CM

## 2019-02-18 DIAGNOSIS — I10 Essential (primary) hypertension: Secondary | ICD-10-CM

## 2019-02-18 LAB — BAYER DCA HB A1C WAIVED: HB A1C (BAYER DCA - WAIVED): 5.8 % (ref <7.0)

## 2019-02-18 MED ORDER — JARDIANCE 10 MG PO TABS: 10.0000 mg | ORAL_TABLET | Freq: Every day | ORAL | 1 refills | Status: DC

## 2019-02-18 MED ORDER — GLUCOSE BLOOD VI STRP: ORAL_STRIP | 12 refills | Status: DC

## 2019-02-18 NOTE — Assessment & Plan Note (Signed)
Chronic, stable with no recent NTG use.  Continue current medication regimen and collaboration with cardiology. 

## 2019-02-18 NOTE — Assessment & Plan Note (Signed)
Chronic, ongoing.  Continue current medication regimen.  Labs today. 

## 2019-02-18 NOTE — Assessment & Plan Note (Addendum)
Chronic, stable with A1C today 5.8%.  Continue Metformin and discontinue Lantus due to concern for intermittent hypoglycemia, will instead initiate Jardiance which will also provide cardiac benefit.  Urine micro ALB 80 and A:C 30-300 at recent visit, allergic to ACE.  May be able to trial ARB in future.  CCM referral placed.  Return in 4 weeks to see if we can increase Jardiance to 25 MG.

## 2019-02-18 NOTE — Progress Notes (Signed)
BP 138/79   Pulse 68   Temp 98.1 F (36.7 C) (Oral)   Ht 5' 11.5" (1.816 m)   Wt 172 lb (78 kg)   SpO2 98%   BMI 23.65 kg/m    Subjective:    Patient ID: Johnny Wolfe, male    DOB: 09/22/1945, 73 y.o.   MRN: [id number]  HPI: Johnny Wolfe is a 73 y.o. male presenting on 02/18/2019 for comprehensive medical examination. Current medical complaints include:none  He currently lives with: wife Interim Problems from his last visit: no   DIABETES June A1C 5.6%.  Continues on Lantus and Metformin.  Actos discontinued at last visit. Hypoglycemic episodes:yes, has had two under 60 Polydipsia/polyuria: no Visual disturbance: no Chest pain: no Paresthesias: no Glucose Monitoring: yes  Accucheck frequency: Daily  Fasting glucose: 141 this morning, on average 110  Post prandial:  Evening:  Before meals: Taking Insulin?: no  Long acting insulin:  Short acting insulin: Blood Pressure Monitoring: rarely Retinal Examination: Up to Date Foot Exam: Up to Date Pneumovax: Up to Date Influenza: Up to Date   HYPERTENSION / HYPERLIPIDEMIA Continues on Metoprolol, ASA, NTG, and Atorvastatin.  Saw Dr. Rockey Situ one year ago, 08/16/2017.  Is to see him again in one year, this year, he will schedule. On review of note Plavix/ASA for one year only, he reports that Dr. Rockey Situ told him "we could probably stop Plavix after 6 months" and he has stopped.  Has not used NTG, no recent chest pain.   Satisfied with current treatment? yes Duration of hypertension: chronic BP monitoring frequency: rarely BP range: 120/70's at home BP medication side effects: no Duration of hyperlipidemia: chronic Cholesterol medication side effects: no Cholesterol supplements: none Medication compliance: good compliance Aspirin: yes Recent stressors: no Recurrent headaches: no Visual changes: no Palpitations: no Dyspnea: no Chest pain: no Lower extremity edema: no Dizzy/lightheaded: no Aspirin: no  Functional  Status Survey: Is the patient deaf or have difficulty hearing?: No Does the patient have difficulty seeing, even when wearing glasses/contacts?: No Does the patient have difficulty concentrating, remembering, or making decisions?: No Does the patient have difficulty walking or climbing stairs?: No Does the patient have difficulty dressing or bathing?: No Does the patient have difficulty doing errands alone such as visiting a doctor's office or shopping?: No  FALL RISK: Fall Risk  11/04/2017 09/16/2017 10/30/2016  Falls in the past year? No No No    Depression Screen Depression screen [facility] 2/9 02/18/2019 11/04/2017 09/16/2017 10/30/2016 10/19/2016  Decreased Interest 0 0 0 0 0  Down, Depressed, Hopeless 0 0 0 0 0  PHQ - 2 Score 0 0 0 0 0  Altered sleeping 0 - - 0 -  Tired, decreased energy 0 - - 1 -  Change in appetite 0 - - 1 -  Feeling bad or failure about yourself  0 - - 0 -  Trouble concentrating 0 - - 0 -  Moving slowly or fidgety/restless 0 - - 0 -  Suicidal thoughts 0 - - 0 -  PHQ-9 Score 0 - - 2 -  Difficult doing work/chores Not difficult at all - - - -    Advanced Directives <no information>  Past Medical History:  Past Medical History:  Diagnosis Date  . CAD (coronary artery disease)    a. 06/2017 Cardiac CT: Ca2+ score of 2310 - 95th%'ile;  b. 07/2017 Cath/PCI: LM nl, LAD 60p, 20m, LCX 60p, OM2 40p, RCA dominant, 80p (3.5x15 [place] DES), 83m, 30d, EF 55-65%.  Marland Kitchen  Cataract   . Diabetes mellitus without complication ([place])   . Essential hypertension   . Hyperlipidemia   . Syncope   . Wears dentures    full upper and lower    Surgical History:  Past Surgical History:  Procedure Laterality Date  . APPENDECTOMY    . CATARACT EXTRACTION, BILATERAL     August 2017  . CORONARY STENT INTERVENTION N/A 08/02/2017   Procedure: CORONARY STENT INTERVENTION;  Surgeon: Wellington Hampshire, MD;  Location: [facility];  Service: Cardiovascular;  Laterality: N/A;  . LEFT HEART CATH  AND CORONARY ANGIOGRAPHY Left 08/02/2017   Procedure: LEFT HEART CATH AND CORONARY ANGIOGRAPHY;  Surgeon: Minna Merritts, MD;  Location: [facility];  Service: Cardiovascular;  Laterality: Left;  Marland Kitchen MASS EXCISION Left 07/04/2017   Procedure: REMOVAL TWO CYST EAR EAR LOBE AND EXTERNAL EAR;  Surgeon: Beverly Gust, MD;  Location: [facility];  Service: ENT;  Laterality: Left;  LOCAL Diabetic - insulin and oral meds    Medications:  Current Outpatient Medications on File Prior to Visit  Medication Sig  . aspirin EC 81 MG tablet Take 81 mg by mouth daily.  Marland Kitchen atorvastatin (LIPITOR) 40 MG tablet TAKE 1 TABLET BY MOUTH  DAILY AT 6 PM  . metFORMIN (GLUCOPHAGE) 1000 MG tablet TAKE 1 TABLET BY MOUTH TWO  TIMES DAILY WITH A MEAL  . metoprolol tartrate (LOPRESSOR) 25 MG tablet Take 0.5 tablets (12.5 mg total) by mouth 2 (two) times daily.  . nitroGLYCERIN (NITROSTAT) 0.4 MG SL tablet Place 1 tablet (0.4 mg total) under the tongue every 5 (five) minutes as needed for chest pain.   No current facility-administered medications on file prior to visit.     Allergies:  Allergies  Allergen Reactions  . Ace Inhibitors     Hyperkalemia  . Neosporin [Neomycin-Bacitracin Zn-Polymyx]     Red rash     Social History:  Social History   Socioeconomic History  . Marital status: Married    Spouse name: Not on file  . Number of children: Not on file  . Years of education: Not on file  . Highest education level: Not on file  Occupational History  . Not on file  Social Needs  . Financial resource strain: Not hard at all  . Food insecurity    Worry: Never true    Inability: Never true  . Transportation needs    Medical: No    Non-medical: No  Tobacco Use  . Smoking status: Former Smoker    Packs/day: 1.00    Quit date: 10/27/2011    Years since quitting: 7.3  . Smokeless tobacco: Never Used  Substance and Sexual Activity  . Alcohol use: Yes    Alcohol/week: 8.0 - 14.0 standard  drinks    Types: 2 - 4 Glasses of wine, 6 - 10 Shots of liquor per week  . Drug use: No  . Sexual activity: Never  Lifestyle  . Physical activity    Days per week: 0 days    Minutes per session: 0 min  . Stress: Not at all  Relationships  . Social connections    Talks on phone: More than three times a week    Gets together: More than three times a week    Attends religious service: Never    Active member of club or organization: No    Attends meetings of clubs or organizations: Never    Relationship status: Married  . Intimate  partner violence    Fear of current or ex partner: No    Emotionally abused: No    Physically abused: No    Forced sexual activity: No  Other Topics Concern  . Not on file  Social History Narrative  . Not on file   Social History   Tobacco Use  Smoking Status Former Smoker  . Packs/day: 1.00  . Quit date: 10/27/2011  . Years since quitting: 7.3  Smokeless Tobacco Never Used   Social History   Substance and Sexual Activity  Alcohol Use Yes  . Alcohol/week: 8.0 - 14.0 standard drinks  . Types: 2 - 4 Glasses of wine, 6 - 10 Shots of liquor per week    Family History:  Family History  Problem Relation Age of Onset  . Diabetes Mother   . Arrhythmia Mother 65       Pacemaker implant   . Cancer Father        lung    Past medical history, surgical history, medications, allergies, family history and social history reviewed with patient today and changes made to appropriate areas of the chart.   Review of Systems - negative All other ROS negative except what is listed above and in the HPI.      Objective:    BP 138/79   Pulse 68   Temp 98.1 F (36.7 C) (Oral)   Ht 5' 11.5" (1.816 m)   Wt 172 lb (78 kg)   SpO2 98%   BMI 23.65 kg/m   Wt Readings from Last 3 Encounters:  02/18/19 172 lb (78 kg)  05/01/18 187 lb (84.8 kg)  11/26/17 184 lb 12.8 oz (83.8 kg)    Physical Exam Vitals signs and nursing note reviewed.  Constitutional:       General: He is awake. He is not in acute distress.    Appearance: He is well-developed. He is not ill-appearing.  HENT:     Head: Normocephalic and atraumatic.     Right Ear: Hearing, tympanic membrane, ear canal and external ear normal. No drainage.     Left Ear: Hearing, tympanic membrane, ear canal and external ear normal. No drainage.     Nose: Nose normal.     Mouth/Throat:     Pharynx: Uvula midline.  Eyes:     General: Lids are normal.        Right eye: No discharge.        Left eye: No discharge.     Extraocular Movements: Extraocular movements intact.     Conjunctiva/sclera: Conjunctivae normal.     Pupils: Pupils are equal, round, and reactive to light.     Visual Fields: Right eye visual fields normal and left eye visual fields normal.  Neck:     Musculoskeletal: Normal range of motion and neck supple.     Thyroid: No thyromegaly.     Vascular: No carotid bruit or JVD.  Cardiovascular:     Rate and Rhythm: Normal rate and regular rhythm.     Heart sounds: Normal heart sounds, S1 normal and S2 normal. No murmur. No gallop.   Pulmonary:     Effort: Pulmonary effort is normal. No accessory muscle usage or respiratory distress.     Breath sounds: Normal breath sounds.  Abdominal:     General: Bowel sounds are normal.     Palpations: Abdomen is soft. There is no hepatomegaly or splenomegaly.     Tenderness: There is no abdominal tenderness.  Musculoskeletal: Normal range of  motion.     Right lower leg: No edema.     Left lower leg: No edema.  Lymphadenopathy:     Head:     Right side of head: No submental, submandibular, tonsillar, preauricular or posterior auricular adenopathy.     Left side of head: No submental, submandibular, tonsillar, preauricular or posterior auricular adenopathy.     Cervical: No cervical adenopathy.  Skin:    General: Skin is warm and dry.     Capillary Refill: Capillary refill takes less than 2 seconds.     Findings: No rash.   Neurological:     Mental Status: He is alert and oriented to person, place, and time.     Deep Tendon Reflexes: Reflexes are normal and symmetric.     Reflex Scores:      Brachioradialis reflexes are 2+ on the right side and 2+ on the left side.      Patellar reflexes are 2+ on the right side and 2+ on the left side. Psychiatric:        Attention and Perception: Attention normal.        Mood and Affect: Mood normal.        Speech: Speech normal.        Behavior: Behavior normal. Behavior is cooperative.        Thought Content: Thought content normal.        Judgment: Judgment normal.    6CIT Screen 02/18/2019 11/04/2017  What Year? 0 points 0 points  What month? 0 points 0 points  What time? 0 points 0 points  Count back from 20 0 points 0 points  Months in reverse 0 points 0 points  Repeat phrase 0 points 0 points  Total Score 0 0    Results for orders placed or performed in visit on 10/08/18  Bayer DCA Hb A1c Waived  Result Value Ref Range   HB A1C (BAYER DCA - WAIVED) 5.6 <7.0 %  Microalbumin, Urine Waived  Result Value Ref Range   Microalb, Ur Waived 80 (H) 0 - 19 mg/L   Creatinine, Urine Waived 200 10 - 300 mg/dL   Microalb/Creat Ratio 30-300 (H) <30 mg/g  Comprehensive metabolic panel  Result Value Ref Range   Glucose 113 (H) 65 - 99 mg/dL   BUN 20 8 - 27 mg/dL   Creatinine, Ser 1.00 0.76 - 1.27 mg/dL   GFR calc non Af Amer 76 >59 mL/min/1.73   GFR calc Af Amer 88 >59 mL/min/1.73   BUN/Creatinine Ratio 20 10 - 24   Sodium 142 134 - 144 mmol/L   Potassium 5.3 (H) 3.5 - 5.2 mmol/L   Chloride 104 96 - 106 mmol/L   CO2 23 20 - 29 mmol/L   Calcium 9.9 8.6 - 10.2 mg/dL   Total Protein 6.6 6.0 - 8.5 g/dL   Albumin 4.5 3.8 - 4.8 g/dL   Globulin, Total 2.1 1.5 - 4.5 g/dL   Albumin/Globulin Ratio 2.1 1.2 - 2.2   Bilirubin Total 0.5 0.0 - 1.2 mg/dL   Alkaline Phosphatase 92 39 - 117 IU/L   AST 19 0 - 40 IU/L   ALT 12 0 - 44 IU/L      Assessment & Plan:   Problem  List Items Addressed This Visit      Cardiovascular and Mediastinum   Essential hypertension, benign    Chronic, ongoing.  BP at goal today and on rare home readings.  Continue current medication regimen and collaboration with cardiology.  To schedule follow-up with Dr. Rockey Situ.  Return in 3 months.        Relevant Orders   Comprehensive metabolic panel   Coronary artery disease involving native coronary artery with angina pectoris (HCC)    Chronic, stable with no recent NTG use.  Continue current medication regimen and collaboration with cardiology.        Endocrine   Type 2 diabetes mellitus without complication (HCC)    Chronic, stable with A1C today 5.8%.  Continue Metformin and discontinue Lantus due to concern for intermittent hypoglycemia, will instead initiate Jardiance which will also provide cardiac benefit.  Urine micro ALB 80 and A:C 30-300 at recent visit, allergic to ACE.  May be able to trial ARB in future.  CCM referral placed.  Return in 4 weeks to see if we can increase Jardiance to 25 MG.      Relevant Medications   empagliflozin (JARDIANCE) 10 MG TABS tablet   Other Relevant Orders   Bayer DCA Hb A1c Waived   Referral to Chronic Care Management Services   Hyperlipidemia associated with type 2 diabetes mellitus (HCC)    Chronic, ongoing.  Continue current medication regimen.  Labs today.      Relevant Medications   empagliflozin (JARDIANCE) 10 MG TABS tablet   Other Relevant Orders   Lipid Panel w/o Chol/HDL Ratio   Comprehensive metabolic panel    Other Visit Diagnoses    Annual physical exam    -  Primary   CBC and TSH   Relevant Orders   CBC with Differential/Platelet   TSH      Discussed aspirin prophylaxis for myocardial infarction prevention and decision was made to continue ASA  LABORATORY TESTING:  Health maintenance labs ordered today as discussed above.   The natural history of prostate cancer and ongoing controversy regarding screening and  potential treatment outcomes of prostate cancer has been discussed with the patient. The meaning of a false positive PSA and a false negative PSA has been discussed. He indicates understanding of the limitations of this screening test and wishes not to proceed with screening PSA testing, age over 51.   IMMUNIZATIONS:   - Tdap: Tetanus vaccination status reviewed: last tetanus booster within 10 years. - Influenza: Up to date - Pneumovax: Up to date - Prevnar: Up to date - Zostavax vaccine: Up to date  SCREENING: - Colonoscopy: Up to date  Discussed with patient purpose of the colonoscopy is to detect colon cancer at curable precancerous or early stages   - AAA Screening: Not applicable  -Hearing Test: Not applicable  -Spirometry: Not applicable   PATIENT COUNSELING:    Sexuality: Discussed sexually transmitted diseases, partner selection, use of condoms, avoidance of unintended pregnancy  and contraceptive alternatives.   Advised to avoid cigarette smoking.  I discussed with the patient that most people either abstain from alcohol or drink within safe limits (<=14/week and <=4 drinks/occasion for males, <=7/weeks and <= 3 drinks/occasion for females) and that the risk for alcohol disorders and other health effects rises proportionally with the number of drinks per week and how often a drinker exceeds daily limits.  Discussed cessation/primary prevention of drug use and availability of treatment for abuse.   Diet: Encouraged to adjust caloric intake to maintain  or achieve ideal body weight, to reduce intake of dietary saturated fat and total fat, to limit sodium intake by avoiding high sodium foods and not adding table salt, and to maintain adequate dietary potassium and calcium preferably  from fresh fruits, vegetables, and low-fat dairy products.    stressed the importance of regular exercise  Injury prevention: Discussed safety belts, safety helmets, smoke detector, smoking near  bedding or upholstery.   Dental health: Discussed importance of regular tooth brushing, flossing, and dental visits.   Follow up plan: NEXT PREVENTATIVE PHYSICAL DUE IN 1 YEAR. Return in about 4 weeks (around 03/18/2019) for T2DM follow-up.

## 2019-02-18 NOTE — Patient Instructions (Signed)
Visit Information  Goals Addressed            This Visit's Progress     Patient Stated   . PharmD "I want to stay healthy" (pt-stated)       Current Barriers:  . Diabetes: controlled, but not optimally managed; most recent A1c 5.8%  . Current antihyperglycemic regimen: metformin 1000 mg BID, Lantus 16 units daily o Notes hx Glyxambi (metformin/sitagliptin), pioglitazone (d/c d/t cardiac disease hx) . Reports episodes of hypoglycemia overnight, waking him up, and some with documented fasting blood sugars  . Current blood glucose readings: charts fasting blood sugars; trend line appears between 90-100s . Cardiovascular risk reduction (hx CAD s/p stent to RCA in 07/2017) o Current hypertensive regimen: metoprolol tartrate 12.5 mg BID (hx intolerance to ACEI); BP at goal <130/80 today in office o Current hyperlipidemia regimen: atorvastatin 40 mg daily; lipids pending today o Antiplatelet regimen: ASA 81 mg (completed 1 year of DAPT w/ clopidogrel)  Pharmacist Clinical Goal(s):  Marland Kitchen Over the next 90 days, patient will work with PharmD and primary care provider to address optimized medication management  Interventions: . Comprehensive medication review performed, medication list updated in electronic medical record . Patient discussed his financial concerns. He is over income for patient assistance programs.  . Discussed long term benefits of SGLT2, including CV and renal risk reduction, as well as lessened risk of hypoglycemia vs Lantus. Collaborated w/ Jolene Cannady; d/c Lantus and start Jardiance 10 mg daily. Patient counseled on mechanism of action, optimal administration time, onset of full effect   Patient Self Care Activities:  . Patient will check blood glucose daily , document, and provide at future appointments . Patient will take medications as prescribed . Patient will report any questions or concerns to provider   Initial goal documentation        The patient verbalized  understanding of instructions provided today and declined a print copy of patient instruction materials.   Plan: - Will outreach patient in ~4-5 weeks for continued medication management support   Catie Darnelle Maffucci, PharmD Clinical Pharmacist [place] [phone]

## 2019-02-18 NOTE — Assessment & Plan Note (Signed)
Chronic, ongoing.  BP at goal today and on rare home readings.  Continue current medication regimen and collaboration with cardiology.  To schedule follow-up with Dr. Rockey Situ.  Return in 3 months.

## 2019-02-18 NOTE — Chronic Care Management (AMB) (Signed)
Chronic Care Management   Note  02/18/2019 Name: Johnny Wolfe MRN: [id number] DOB: [birth year]   Subjective:  Johnny Wolfe is a 73 y.o. year old male who is a primary care patient of Cannady, Barbaraann Faster, NP. The CCM team was consulted for assistance with chronic disease management and care coordination needs.    Met with patient face to face today during visit with Marnee Guarneri, NP  [name] was given information about Chronic Care Management services today including:  1. CCM service includes personalized support from designated clinical staff supervised by his physician, including individualized plan of care and coordination with other care providers 2. 24/7 contact phone numbers for assistance for urgent and routine care needs. 3. Service will only be billed when office clinical staff spend 20 minutes or more in a month to coordinate care. 4. Only one practitioner may furnish and bill the service in a calendar month. 5. The patient may stop CCM services at any time (effective at the end of the month) by phone call to the office staff. 6. The patient will be responsible for cost sharing (co-pay) of up to 20% of the service fee (after annual deductible is met).  Patient agreed to services and verbal consent obtained.   Review of patient status, including review of consultants reports, laboratory and other test data, was performed as part of comprehensive evaluation and provision of chronic care management services.   Objective:  Lab Results  Component Value Date   CREATININE 1.00 10/08/2018   CREATININE 1.01 10/14/2017   CREATININE 0.88 09/16/2017    Lab Results  Component Value Date   HGBA1C 5.6 10/08/2018       Component Value Date/Time   CHOL 147 05/01/2018 1041   TRIG 67 05/01/2018 1041   HDL 73 09/16/2017 1003   VLDL 13 05/01/2018 1041   LDLCALC 36 09/16/2017 1003    Clinical ASCVD: Yes - CAD  The 10-year ASCVD risk score Mikey Bussing DC Jr., et al., 2013) is: 32.7%    Values used to calculate the score:     Age: 64 years     Sex: Male     Is Non-Hispanic African American: No     Diabetic: Yes     Tobacco smoker: No     Systolic Blood Pressure: 696 mmHg     Is BP treated: Yes     HDL Cholesterol: 76 mg/dL     Total Cholesterol: 147 mg/dL    BP Readings from Last 3 Encounters:  02/18/19 138/79  10/08/18 129/75  05/01/18 116/62    Allergies  Allergen Reactions   Ace Inhibitors     Hyperkalemia   Neosporin [Neomycin-Bacitracin Zn-Polymyx]     Red rash     Medications Reviewed Today    Reviewed by Venita Lick, NP (Nurse Practitioner) on 02/18/19 at [facility] List Status: <None>  Medication Order Taking? Sig Documenting Provider Last Dose Status Informant  aspirin EC 81 MG tablet [id number] Yes Take 81 mg by mouth daily. [provider] Taking Active   atorvastatin (LIPITOR) 40 MG tablet [id number] Yes TAKE 1 TABLET BY MOUTH  DAILY AT 6 PM Cannady, Jolene T, NP Taking Active   empagliflozin (JARDIANCE) 10 MG TABS tablet [id number]  Take 10 mg by mouth daily. Cannady, Jolene T, NP  Active   glucose blood (ONE TOUCH ULTRA TEST) test strip [id number] Yes Check blood 2-3 times a day Cannady, Jolene T, NP  Active   metFORMIN (GLUCOPHAGE) 1000 MG tablet  [id number] Yes TAKE 1 TABLET BY MOUTH TWO  TIMES DAILY WITH A MEAL Cannady, Jolene T, NP Taking Active   metoprolol tartrate (LOPRESSOR) 25 MG tablet [id number] Yes Take 0.5 tablets (12.5 mg total) by mouth 2 (two) times daily. Marnee Guarneri T, NP Taking Active   nitroGLYCERIN (NITROSTAT) 0.4 MG SL tablet [id number] Yes Place 1 tablet (0.4 mg total) under the tongue every 5 (five) minutes as needed for chest pain. Theora Gianotti, NP Taking Active            Assessment:   Goals Addressed            This Visit's Progress     Patient Stated    PharmD "I want to stay healthy" (pt-stated)       Current Barriers:   Diabetes: controlled, but not optimally managed; most  recent A1c 5.8%   Current antihyperglycemic regimen: metformin 1000 mg BID, Lantus 16 units daily o Notes hx Glyxambi (metformin/sitagliptin), pioglitazone (d/c d/t cardiac disease hx)  Reports episodes of hypoglycemia overnight, waking him up, and some with documented fasting blood sugars   Current blood glucose readings: charts fasting blood sugars; trend line appears between 90-100s  Cardiovascular risk reduction (hx CAD s/p stent to RCA in 07/2017) o Current hypertensive regimen: metoprolol tartrate 12.5 mg BID (hx intolerance to ACEI); BP at goal <130/80 today in office o Current hyperlipidemia regimen: atorvastatin 40 mg daily; lipids pending today o Antiplatelet regimen: ASA 81 mg (completed 1 year of DAPT w/ clopidogrel)  Pharmacist Clinical Goal(s):   Over the next 90 days, patient will work with PharmD and primary care provider to address optimized medication management  Interventions:  Comprehensive medication review performed, medication list updated in electronic medical record  Patient discussed his financial concerns. He is over income for patient assistance programs.   Discussed long term benefits of SGLT2, including CV and renal risk reduction, as well as lessened risk of hypoglycemia vs Lantus. Collaborated w/ Jolene Cannady; d/c Lantus and start Jardiance 10 mg daily. Patient counseled on mechanism of action, optimal administration time, onset of full effect   Patient Self Care Activities:   Patient will check blood glucose daily , document, and provide at future appointments  Patient will take medications as prescribed  Patient will report any questions or concerns to provider   Initial goal documentation        Plan: - Will outreach patient in ~4-5 weeks for continued medication management support   Catie Darnelle Maffucci, PharmD Clinical Pharmacist [place] [phone]

## 2019-02-18 NOTE — Patient Instructions (Signed)
Carbohydrate Counting for Diabetes Mellitus, Adult  Carbohydrate counting is a method of keeping track of how many carbohydrates you eat. Eating carbohydrates naturally increases the amount of sugar (glucose) in the blood. Counting how many carbohydrates you eat helps keep your blood glucose within normal limits, which helps you manage your diabetes (diabetes mellitus). It is important to know how many carbohydrates you can safely have in each meal. This is different for every person. A diet and nutrition specialist (registered dietitian) can help you make a meal plan and calculate how many carbohydrates you should have at each meal and snack. Carbohydrates are found in the following foods:  Grains, such as breads and cereals.  Dried beans and soy products.  Starchy vegetables, such as potatoes, peas, and corn.  Fruit and fruit juices.  Milk and yogurt.  Sweets and snack foods, such as cake, cookies, candy, chips, and soft drinks. How do I count carbohydrates? There are two ways to count carbohydrates in food. You can use either of the methods or a combination of both. Reading "Nutrition Facts" on packaged food The "Nutrition Facts" list is included on the labels of almost all packaged foods and beverages in the [place] It includes:  The serving size.  Information about nutrients in each serving, including the grams (g) of carbohydrate per serving. To use the "Nutrition Facts":  Decide how many servings you will have.  Multiply the number of servings by the number of carbohydrates per serving.  The resulting number is the total amount of carbohydrates that you will be having. Learning standard serving sizes of other foods When you eat carbohydrate foods that are not packaged or do not include "Nutrition Facts" on the label, you need to measure the servings in order to count the amount of carbohydrates:  Measure the foods that you will eat with a food scale or measuring cup, if needed.   Decide how many standard-size servings you will eat.  Multiply the number of servings by 15. Most carbohydrate-rich foods have about 15 g of carbohydrates per serving. ? For example, if you eat 8 oz (170 g) of strawberries, you will have eaten 2 servings and 30 g of carbohydrates (2 servings x 15 g = 30 g).  For foods that have more than one food mixed, such as soups and casseroles, you must count the carbohydrates in each food that is included. The following list contains standard serving sizes of common carbohydrate-rich foods. Each of these servings has about 15 g of carbohydrates:   hamburger bun or  English muffin.   oz (15 mL) syrup.   oz (14 g) jelly.  1 slice of bread.  1 six-inch tortilla.  3 oz (85 g) cooked rice or pasta.  4 oz (113 g) cooked dried beans.  4 oz (113 g) starchy vegetable, such as peas, corn, or potatoes.  4 oz (113 g) hot cereal.  4 oz (113 g) mashed potatoes or  of a large baked potato.  4 oz (113 g) canned or frozen fruit.  4 oz (120 mL) fruit juice.  4-6 crackers.  6 chicken nuggets.  6 oz (170 g) unsweetened dry cereal.  6 oz (170 g) plain fat-free yogurt or yogurt sweetened with artificial sweeteners.  8 oz (240 mL) milk.  8 oz (170 g) fresh fruit or one small piece of fruit.  24 oz (680 g) popped popcorn. Example of carbohydrate counting Sample meal  3 oz (85 g) chicken breast.  6 oz (170 g)   brown rice.  4 oz (113 g) corn.  8 oz (240 mL) milk.  8 oz (170 g) strawberries with sugar-free whipped topping. Carbohydrate calculation 1. Identify the foods that contain carbohydrates: ? Rice. ? Corn. ? Milk. ? Strawberries. 2. Calculate how many servings you have of each food: ? 2 servings rice. ? 1 serving corn. ? 1 serving milk. ? 1 serving strawberries. 3. Multiply each number of servings by 15 g: ? 2 servings rice x 15 g = 30 g. ? 1 serving corn x 15 g = 15 g. ? 1 serving milk x 15 g = 15 g. ? 1 serving  strawberries x 15 g = 15 g. 4. Add together all of the amounts to find the total grams of carbohydrates eaten: ? 30 g + 15 g + 15 g + 15 g = 75 g of carbohydrates total. Summary  Carbohydrate counting is a method of keeping track of how many carbohydrates you eat.  Eating carbohydrates naturally increases the amount of sugar (glucose) in the blood.  Counting how many carbohydrates you eat helps keep your blood glucose within normal limits, which helps you manage your diabetes.  A diet and nutrition specialist (registered dietitian) can help you make a meal plan and calculate how many carbohydrates you should have at each meal and snack. This information is not intended to replace advice given to you by your health care provider. Make sure you discuss any questions you have with your health care provider. Document Released: 04/16/2005 Document Revised: 11/08/2016 Document Reviewed: 09/28/2015 Elsevier Patient Education  2020 [employer]  

## 2019-02-19 ENCOUNTER — Other Ambulatory Visit: Payer: Self-pay | Admitting: Nurse Practitioner

## 2019-02-19 LAB — COMPREHENSIVE METABOLIC PANEL
ALT: 14 IU/L (ref 0–44)
AST: 21 IU/L (ref 0–40)
Albumin/Globulin Ratio: 2.3 — ABNORMAL HIGH (ref 1.2–2.2)
Albumin: 4.5 g/dL (ref 3.7–4.7)
Alkaline Phosphatase: 108 IU/L (ref 39–117)
BUN/Creatinine Ratio: 20 (ref 10–24)
BUN: 18 mg/dL (ref 8–27)
Bilirubin Total: 0.7 mg/dL (ref 0.0–1.2)
CO2: 23 mmol/L (ref 20–29)
Calcium: 9.4 mg/dL (ref 8.6–10.2)
Chloride: 101 mmol/L (ref 96–106)
Creatinine, Ser: 0.88 mg/dL (ref 0.76–1.27)
GFR calc Af Amer: 100 mL/min/{1.73_m2} (ref >59)
GFR calc non Af Amer: 86 mL/min/{1.73_m2} (ref >59)
Globulin, Total: 2 g/dL (ref 1.5–4.5)
Glucose: 102 mg/dL — ABNORMAL HIGH (ref 65–99)
Potassium: 5.5 mmol/L — ABNORMAL HIGH (ref 3.5–5.2)
Sodium: 139 mmol/L (ref 134–144)
Total Protein: 6.5 g/dL (ref 6.0–8.5)

## 2019-02-19 LAB — CBC WITH DIFFERENTIAL/PLATELET
Basophils Absolute: 0.1 10*3/uL (ref 0.0–0.2)
Basos: 1 %
EOS (ABSOLUTE): 0.2 10*3/uL (ref 0.0–0.4)
Eos: 4 %
Hematocrit: 36.4 % — ABNORMAL LOW (ref 37.5–51.0)
Hemoglobin: 12.1 g/dL — ABNORMAL LOW (ref 13.0–17.7)
Immature Grans (Abs): 0 10*3/uL (ref 0.0–0.1)
Immature Granulocytes: 0 %
Lymphocytes Absolute: 1.5 10*3/uL (ref 0.7–3.1)
Lymphs: 24 %
MCH: 31.3 pg (ref 26.6–33.0)
MCHC: 33.2 g/dL (ref 31.5–35.7)
MCV: 94 fL (ref 79–97)
Monocytes Absolute: 0.5 10*3/uL (ref 0.1–0.9)
Monocytes: 8 %
Neutrophils Absolute: 3.9 10*3/uL (ref 1.4–7.0)
Neutrophils: 63 %
Platelets: 217 10*3/uL (ref 150–450)
RBC: 3.87 x10E6/uL — ABNORMAL LOW (ref 4.14–5.80)
RDW: 13.2 % (ref 11.6–15.4)
WBC: 6.2 10*3/uL (ref 3.4–10.8)

## 2019-02-19 LAB — LIPID PANEL W/O CHOL/HDL RATIO
Cholesterol, Total: 122 mg/dL (ref 100–199)
HDL: 70 mg/dL (ref >39)
LDL Chol Calc (NIH): 38 mg/dL (ref 0–99)
Triglycerides: 65 mg/dL (ref 0–149)
VLDL Cholesterol Cal: 14 mg/dL (ref 5–40)

## 2019-02-19 LAB — TSH: TSH: 2.75 u[IU]/mL (ref 0.450–4.500)

## 2019-02-19 MED ORDER — METFORMIN HCL 1000 MG PO TABS: ORAL_TABLET | ORAL | 3 refills | Status: DC

## 2019-02-19 MED ORDER — ATORVASTATIN CALCIUM 40 MG PO TABS: ORAL_TABLET | ORAL | 3 refills | Status: DC

## 2019-03-05 ENCOUNTER — Ambulatory Visit (INDEPENDENT_AMBULATORY_CARE_PROVIDER_SITE_OTHER): Payer: Medicare Other

## 2019-03-05 VITALS — BP 117/65 | Ht 71.5 in | Wt 172.0 lb

## 2019-03-05 DIAGNOSIS — Z0000 Encounter for general adult medical examination without abnormal findings: Secondary | ICD-10-CM

## 2019-03-05 NOTE — Patient Instructions (Signed)
Johnny Wolfe , Thank you for taking time to come for your Medicare Wellness Visit. I appreciate your ongoing commitment to your health goals. Please review the following plan we discussed and let me know if I can assist you in the future.   Screening recommendations/referrals: Colonoscopy: completed 2016 Recommended yearly ophthalmology/optometry visit for glaucoma screening and checkup Recommended yearly dental visit for hygiene and checkup  Vaccinations: Influenza vaccine: up to date Pneumococcal vaccine: up to date Tdap vaccine: up to date Shingles vaccine: up to date    Advanced directives: copy on file  Conditions/risks identified: diabetic, already in contact with chronic care management team   Next appointment: follow up in one year for your annual wellness visit.   Preventive Care 71 Years and Older, Male Preventive care refers to lifestyle choices and visits with your health care provider that can promote health and wellness. What does preventive care include?  A yearly physical exam. This is also called an annual well check.  Dental exams once or twice a year.  Routine eye exams. Ask your health care provider how often you should have your eyes checked.  Personal lifestyle choices, including:  Daily care of your teeth and gums.  Regular physical activity.  Eating a healthy diet.  Avoiding tobacco and drug use.  Limiting alcohol use.  Practicing safe sex.  Taking low doses of aspirin every day.  Taking vitamin and mineral supplements as recommended by your health care provider. What happens during an annual well check? The services and screenings done by your health care provider during your annual well check will depend on your age, overall health, lifestyle risk factors, and family history of disease. Counseling  Your health care provider may ask you questions about your:  Alcohol use.  Tobacco use.  Drug use.  Emotional well-being.  Home and  relationship well-being.  Sexual activity.  Eating habits.  History of falls.  Memory and ability to understand (cognition).  Work and work Statistician. Screening  You may have the following tests or measurements:  Height, weight, and BMI.  Blood pressure.  Lipid and cholesterol levels. These may be checked every 5 years, or more frequently if you are over 64 years old.  Skin check.  Lung cancer screening. You may have this screening every year starting at age 70 if you have a 30-pack-year history of smoking and currently smoke or have quit within the past 15 years.  Fecal occult blood test (FOBT) of the stool. You may have this test every year starting at age 104.  Flexible sigmoidoscopy or colonoscopy. You may have a sigmoidoscopy every 5 years or a colonoscopy every 10 years starting at age 22.  Prostate cancer screening. Recommendations will vary depending on your family history and other risks.  Hepatitis C blood test.  Hepatitis B blood test.  Sexually transmitted disease (STD) testing.  Diabetes screening. This is done by checking your blood sugar (glucose) after you have not eaten for a while (fasting). You may have this done every 1-3 years.  Abdominal aortic aneurysm (AAA) screening. You may need this if you are a current or former smoker.  Osteoporosis. You may be screened starting at age 19 if you are at high risk. Talk with your health care provider about your test results, treatment options, and if necessary, the need for more tests. Vaccines  Your health care provider may recommend certain vaccines, such as:  Influenza vaccine. This is recommended every year.  Tetanus, diphtheria, and acellular pertussis (  Tdap, Td) vaccine. You may need a Td booster every 10 years.  Zoster vaccine. You may need this after age 13.  Pneumococcal 13-valent conjugate (PCV13) vaccine. One dose is recommended after age 62.  Pneumococcal polysaccharide (PPSV23) vaccine. One  dose is recommended after age 76. Talk to your health care provider about which screenings and vaccines you need and how often you need them. This information is not intended to replace advice given to you by your health care provider. Make sure you discuss any questions you have with your health care provider. Document Released: 05/13/2015 Document Revised: 01/04/2016 Document Reviewed: 02/15/2015 Elsevier Interactive Patient Education  2017 [place] Prevention in the Home Falls can cause injuries. They can happen to people of all ages. There are many things you can do to make your home safe and to help prevent falls. What can I do on the outside of my home?  Regularly fix the edges of walkways and driveways and fix any cracks.  Remove anything that might make you trip as you walk through a door, such as a raised step or threshold.  Trim any bushes or trees on the path to your home.  Use bright outdoor lighting.  Clear any walking paths of anything that might make someone trip, such as rocks or tools.  Regularly check to see if handrails are loose or broken. Make sure that both sides of any steps have handrails.  Any raised decks and porches should have guardrails on the edges.  Have any leaves, snow, or ice cleared regularly.  Use sand or salt on walking paths during winter.  Clean up any spills in your garage right away. This includes oil or grease spills. What can I do in the bathroom?  Use night lights.  Install grab bars by the toilet and in the tub and shower. Do not use towel bars as grab bars.  Use non-skid mats or decals in the tub or shower.  If you need to sit down in the shower, use a plastic, non-slip stool.  Keep the floor dry. Clean up any water that spills on the floor as soon as it happens.  Remove soap buildup in the tub or shower regularly.  Attach bath mats securely with double-sided non-slip rug tape.  Do not have throw rugs and other  things on the floor that can make you trip. What can I do in the bedroom?  Use night lights.  Make sure that you have a light by your bed that is easy to reach.  Do not use any sheets or blankets that are too big for your bed. They should not hang down onto the floor.  Have a firm chair that has side arms. You can use this for support while you get dressed.  Do not have throw rugs and other things on the floor that can make you trip. What can I do in the kitchen?  Clean up any spills right away.  Avoid walking on wet floors.  Keep items that you use a lot in easy-to-reach places.  If you need to reach something above you, use a strong step stool that has a grab bar.  Keep electrical cords out of the way.  Do not use floor polish or wax that makes floors slippery. If you must use wax, use non-skid floor wax.  Do not have throw rugs and other things on the floor that can make you trip. What can I do with my stairs?  Do  not leave any items on the stairs.  Make sure that there are handrails on both sides of the stairs and use them. Fix handrails that are broken or loose. Make sure that handrails are as long as the stairways.  Check any carpeting to make sure that it is firmly attached to the stairs. Fix any carpet that is loose or worn.  Avoid having throw rugs at the top or bottom of the stairs. If you do have throw rugs, attach them to the floor with carpet tape.  Make sure that you have a light switch at the top of the stairs and the bottom of the stairs. If you do not have them, ask someone to add them for you. What else can I do to help prevent falls?  Wear shoes that:  Do not have high heels.  Have rubber bottoms.  Are comfortable and fit you well.  Are closed at the toe. Do not wear sandals.  If you use a stepladder:  Make sure that it is fully opened. Do not climb a closed stepladder.  Make sure that both sides of the stepladder are locked into place.  Ask  someone to hold it for you, if possible.  Clearly mark and make sure that you can see:  Any grab bars or handrails.  First and last steps.  Where the edge of each step is.  Use tools that help you move around (mobility aids) if they are needed. These include:  Canes.  Walkers.  Scooters.  Crutches.  Turn on the lights when you go into a dark area. Replace any light bulbs as soon as they burn out.  Set up your furniture so you have a clear path. Avoid moving your furniture around.  If any of your floors are uneven, fix them.  If there are any pets around you, be aware of where they are.  Review your medicines with your doctor. Some medicines can make you feel dizzy. This can increase your chance of falling. Ask your doctor what other things that you can do to help prevent falls. This information is not intended to replace advice given to you by your health care provider. Make sure you discuss any questions you have with your health care provider. Document Released: 02/10/2009 Document Revised: 09/22/2015 Document Reviewed: 05/21/2014 Elsevier Interactive Patient Education  2017 [employer].

## 2019-03-05 NOTE — Progress Notes (Signed)
Subjective:   Johnny Wolfe is a 73 y.o. male who presents for Medicare Annual/Subsequent preventive examination.  This visit is being conducted via phone call  - after an attmept to do on video chat - due to the COVID-19 pandemic. This patient has given me verbal consent via phone to conduct this visit, patient states they are participating from their home address. Some vital signs may be absent or patient reported.   Patient identification: identified by name, DOB, and current address.    Review of Systems:  Cardiac Risk Factors include: advanced age (>31men, >76 women);male gender;diabetes mellitus;dyslipidemia;hypertension     Objective:    Vitals: BP 117/65 Comment: pt reported   Ht 5' 11.5" (1.816 m)    Wt 172 lb (78 kg)    BMI 23.65 kg/m   Body mass index is 23.65 kg/m.  Advanced Directives 03/05/2019 11/04/2017 08/02/2017 08/02/2017 07/04/2017 10/30/2016  Does Patient Have a Medical Advance Directive? Yes Yes - No Yes Yes  Type of Advance Directive Living will;Healthcare Power of Attorney Living will;Healthcare Power of [place];Living will [place];Living will  Does patient want to make changes to medical advance directive? - - - - No - Patient declined -  Copy of [place] in Chart? Yes - validated most recent copy scanned in chart (See row information) No - copy requested - - No - copy requested No - copy requested  Would patient like information on creating a medical advance directive? - - No - Patient declined - - -    Tobacco Social History   Tobacco Use  Smoking Status Former Smoker   Packs/day: 1.00   Quit date: 10/27/2011   Years since quitting: 7.3  Smokeless Tobacco Never Used     Counseling given: Not Answered   Clinical Intake:  Pre-visit preparation completed: Yes  Pain : No/denies pain     Nutritional Risks: None Diabetes: No  How often do you need to have someone help you when  you read instructions, pamphlets, or other written materials from your doctor or pharmacy?: 1 - Never  Interpreter Needed?: No  Information entered by :: Flynn Lininger,LPN   Nutrition Risk Assessment:  Has the patient had any N/V/D within the last 2 months?  no Does the patient have any non-healing wounds?  No  Has the patient had any unintentional weight loss or weight gain?  No   Diabetes:  Is the patient diabetic?  Yes  If diabetic, was a CBG obtained today?  No  Did the patient bring in their glucometer from home?  No  How often do you monitor your CBG's? Every morning   Financial Strains and Diabetes Management:  Are you having any financial strains with the device, your supplies or your medication? No .  Does the patient want to be seen by Chronic Care Management for management of their diabetes?  No  Would the patient like to be referred to a Nutritionist or for Diabetic Management?  No    In contact with ccm team   Diabetic Exams:  Diabetic Eye Exam: Completed 04/16/2018  Diabetic Foot Exam: Completed 10/08/2018  Past Medical History:  Diagnosis Date   CAD (coronary artery disease)    a. 06/2017 Cardiac CT: Ca2+ score of 2310 - 95th%'ile;  b. 07/2017 Cath/PCI: LM nl, LAD 60p, 9m, LCX 60p, OM2 40p, RCA dominant, 80p (3.5x15 [place] DES), 86m, 30d, EF 55-65%.   Cataract  Diabetes mellitus without complication [facility])    Essential hypertension    Hyperlipidemia    Syncope    Wears dentures    full upper and lower   Past Surgical History:  Procedure Laterality Date   APPENDECTOMY     CATARACT EXTRACTION, BILATERAL     August 2017   CORONARY STENT INTERVENTION N/A 08/02/2017   Procedure: CORONARY STENT INTERVENTION;  Surgeon: Wellington Hampshire, MD;  Location: [facility];  Service: Cardiovascular;  Laterality: N/A;   LEFT HEART CATH AND CORONARY ANGIOGRAPHY Left 08/02/2017   Procedure: LEFT HEART CATH AND CORONARY ANGIOGRAPHY;  Surgeon: Minna Merritts, MD;  Location: [facility];  Service: Cardiovascular;  Laterality: Left;   MASS EXCISION Left 07/04/2017   Procedure: REMOVAL TWO CYST EAR EAR LOBE AND EXTERNAL EAR;  Surgeon: Beverly Gust, MD;  Location: [facility];  Service: ENT;  Laterality: Left;  LOCAL Diabetic - insulin and oral meds   Family History  Problem Relation Age of Onset   Diabetes Mother    Arrhythmia Mother 70       Pacemaker implant    Cancer Father        lung   Social History   Socioeconomic History   Marital status: Married    Spouse name: Not on file   Number of children: Not on file   Years of education: Not on file   Highest education level: Not on file  Occupational History   Not on file  Social Needs   Financial resource strain: Not hard at all   Food insecurity    Worry: Never true    Inability: Never true   Transportation needs    Medical: No    Non-medical: No  Tobacco Use   Smoking status: Former Smoker    Packs/day: 1.00    Quit date: 10/27/2011    Years since quitting: 7.3   Smokeless tobacco: Never Used  Substance and Sexual Activity   Alcohol use: Yes    Alcohol/week: 8.0 - 14.0 standard drinks    Types: 2 - 4 Glasses of wine, 6 - 10 Shots of liquor per week   Drug use: No   Sexual activity: Never  Lifestyle   Physical activity    Days per week: 0 days    Minutes per session: 0 min   Stress: Not at all  Relationships   Social connections    Talks on phone: More than three times a week    Gets together: More than three times a week    Attends religious service: Never    Active member of club or organization: No    Attends meetings of clubs or organizations: Never    Relationship status: Married  Other Topics Concern   Not on file  Social History Narrative   Not on file    Outpatient Encounter Medications as of 03/05/2019  Medication Sig   aspirin EC 81 MG tablet Take 81 mg by mouth daily.   atorvastatin (LIPITOR) 40  MG tablet TAKE 1 TABLET BY MOUTH  DAILY AT 6 PM   empagliflozin (JARDIANCE) 10 MG TABS tablet Take 10 mg by mouth daily.   glucose blood (ONE TOUCH ULTRA TEST) test strip Check blood 2-3 times a day   metFORMIN (GLUCOPHAGE) 1000 MG tablet TAKE 1 TABLET BY MOUTH TWO  TIMES DAILY WITH A MEAL   metoprolol tartrate (LOPRESSOR) 25 MG tablet Take 0.5 tablets (12.5 mg total) by mouth 2 (two)  times daily.   vitamin B-12 (CYANOCOBALAMIN) 1000 MCG tablet Take 1,000 mcg by mouth daily.   nitroGLYCERIN (NITROSTAT) 0.4 MG SL tablet Place 1 tablet (0.4 mg total) under the tongue every 5 (five) minutes as needed for chest pain. (Patient not taking: Reported on 03/05/2019)   No facility-administered encounter medications on file as of 03/05/2019.     Activities of Daily Living In your present state of health, do you have any difficulty performing the following activities: 03/05/2019 02/18/2019  Hearing? N N  Vision? N N  Comment eyeglasses, [facility] -  Difficulty concentrating or making decisions? N N  Walking or climbing stairs? N N  Dressing or bathing? N N  Doing errands, shopping? N N  Preparing Food and eating ? N -  Using the Toilet? N -  In the past six months, have you accidently leaked urine? N -  Do you have problems with loss of bowel control? N -  Managing your Medications? N -  Managing your Finances? N -  Housekeeping or managing your Housekeeping? N -  Some recent data might be hidden    Patient Care Team: Venita Lick, NP as PCP - General (Nurse Practitioner) Minna Merritts, MD as PCP - Cardiology (Cardiology) De Hollingshead, [facility] as Pharmacist (Pharmacist)   Assessment:   This is a routine wellness examination for Simmie.  Exercise Activities and Dietary recommendations Current Exercise Habits: The patient does not participate in regular exercise at present, Exercise limited by: None identified  Goals     DIET - INCREASE WATER INTAKE     Recommend  drinking at least 6-8 glasses of water a day      PharmD "I want to stay healthy" (pt-stated)     Current Barriers:   Diabetes: controlled, but not optimally managed; most recent A1c 5.8%   Current antihyperglycemic regimen: metformin 1000 mg BID, Lantus 16 units daily o Notes hx Glyxambi (metformin/sitagliptin), pioglitazone (d/c d/t cardiac disease hx)  Reports episodes of hypoglycemia overnight, waking him up, and some with documented fasting blood sugars   Current blood glucose readings: charts fasting blood sugars; trend line appears between 90-100s  Cardiovascular risk reduction (hx CAD s/p stent to RCA in 07/2017) o Current hypertensive regimen: metoprolol tartrate 12.5 mg BID (hx intolerance to ACEI); BP at goal <130/80 today in office o Current hyperlipidemia regimen: atorvastatin 40 mg daily; lipids pending today o Antiplatelet regimen: ASA 81 mg (completed 1 year of DAPT w/ clopidogrel)  Pharmacist Clinical Goal(s):   Over the next 90 days, patient will work with PharmD and primary care provider to address optimized medication management  Interventions:  Comprehensive medication review performed, medication list updated in electronic medical record  Patient discussed his financial concerns. He is over income for patient assistance programs.   Discussed long term benefits of SGLT2, including CV and renal risk reduction, as well as lessened risk of hypoglycemia vs Lantus. Collaborated w/ Jolene Cannady; d/c Lantus and start Jardiance 10 mg daily. Patient counseled on mechanism of action, optimal administration time, onset of full effect   Patient Self Care Activities:   Patient will check blood glucose daily , document, and provide at future appointments  Patient will take medications as prescribed  Patient will report any questions or concerns to provider   Initial goal documentation        Fall Risk: Fall Risk  03/05/2019 11/04/2017 09/16/2017 10/30/2016  Falls in  the past year? 0 No No No  Number  falls in past yr: 0 - - -  Injury with Fall? 0 - - -    FALL RISK PREVENTION PERTAINING TO THE HOME:  Any stairs in or around the home? Yes  If so, are there any without handrails? No   Home free of loose throw rugs in walkways, pet beds, electrical cords, etc? Yes  Adequate lighting in your home to reduce risk of falls? Yes   ASSISTIVE DEVICES UTILIZED TO PREVENT FALLS:  Life alert? No  Use of a cane, walker or w/c? No  Grab bars in the bathroom? No  Shower chair or bench in shower? No  Elevated toilet seat or a handicapped toilet? No   TIMED UP AND GO: Unable to perform   Depression Screen PHQ 2/9 Scores 03/05/2019 02/18/2019 11/04/2017 09/16/2017  PHQ - 2 Score 0 0 0 0  PHQ- 9 Score - 0 - -    Cognitive Function     6CIT Screen 02/18/2019 11/04/2017  What Year? 0 points 0 points  What month? 0 points 0 points  What time? 0 points 0 points  Count back from 20 0 points 0 points  Months in reverse 0 points 0 points  Repeat phrase 0 points 0 points  Total Score 0 0    Immunization History  Administered Date(s) Administered   Fluad Quad(high Dose 65+) 01/28/2019   Influenza, High Dose Seasonal PF 05/10/2016, 03/15/2017, 01/07/2018   Influenza-Unspecified 03/15/2017   Pneumococcal Conjugate-13 10/30/2013   Pneumococcal Polysaccharide-23 06/10/2017   Td 10/31/2010   Zoster 10/30/2013   Zoster Recombinat (Shingrix) 05/02/2018, 07/21/2018    Qualifies for Shingles Vaccine?shingrix completed   Tdap: up to date   Flu Vaccine: up to date   Pneumococcal Vaccine: up to date   Screening Tests Health Maintenance  Topic Date Due   OPHTHALMOLOGY EXAM  04/17/2019   HEMOGLOBIN A1C  08/19/2019   FOOT EXAM  10/08/2019   URINE MICROALBUMIN  10/08/2019   TETANUS/TDAP  10/30/2020   COLONOSCOPY  10/28/2024   INFLUENZA VACCINE  Completed   Hepatitis C Screening  Completed   PNA vac Low Risk Adult  Completed   Cancer  Screenings:  Colorectal Screening: Completed 2016, repeat in 10 years   Lung Cancer Screening: (Low Dose CT Chest recommended if Age 27-80 years, 30 pack-year currently smoking OR have quit w/in 15years.) does qualify.   Additional Screening:  Hepatitis C Screening: does qualify; Completed 2019  Vision Screening: Recommended annual ophthalmology exams for early detection of glaucoma and other disorders of the eye. Is the patient up to date with their annual eye exam?  Yes  Who is the provider or what is the name of the office in which the pt attends annual eye exams? [facility]    Dental Screening: Recommended annual dental exams for proper oral hygiene  Community Resource Referral:  CRR required this visit?  No        Plan:  I have personally reviewed and addressed the Medicare Annual Wellness questionnaire and have noted the following in the patients chart:  A. Medical and social history B. Use of alcohol, tobacco or illicit drugs  C. Current medications and supplements D. Functional ability and status E.  Nutritional status F.  Physical activity G. Advance directives H. List of other physicians I.  Hospitalizations, surgeries, and ER visits in previous 12 months J.  [place] such as hearing and vision if needed, cognitive and depression L. Referrals and appointments   In addition,  I have reviewed and discussed with patient certain preventive protocols, quality metrics, and best practice recommendations. A written personalized care plan for preventive services as well as general preventive health recommendations were provided to patient.   Signed,   Bevelyn Ngo, LPN  [id number] Nurse Health Advisor   Nurse Notes: none

## 2019-03-08 NOTE — Progress Notes (Signed)
Cardiology Office Note  Date:  03/09/2019   ID:  Johnny Wolfe, DOB 1945-06-24, MRN [id number]  PCP:  Venita Lick, NP   Chief Complaint  Patient presents with  . other    12 month follow up. Meds reviewed by the pt. verbally. "doing well."     HPI:  [name] is a 24 old gentleman with past medical history of Diabetes, Hemoglobin A1c 6.3 Hypertension Hyperlipidemia Smoker quit 2013 ETOH Coronary calcium score of 2310. Presenting for follow-up of his syncope and CT coronary calcium scoring Who presents for follow-up of his coronary disease with stent to the RCA  Feels well His blood pressure at home, within a low range 123456 systolic  Hemoglobin 123456 5.8 Reports having recent medication changes Off insulin Started on jardiance  Other lab work reviewed Potassium 5.5, denies eating foods high in potassium Total chol 122, LDL 38  No near syncope or syncope symptoms  EKG personally reviewed by myself on todays visit Shows normal sinus rhythm rate 69 bpm no significant ST or T wave changes  Other past medical history reviewed Cardiac cath 08/02/2017 Severe proximal RCA disease, with stent placement Moderate proximal LAD and LCX disease Mild mid LAD, RCA disease, Vessels are heavily calcified. Normal EF estimated at 55%  Coronary calcium score of 2310. This was 95 percentile for age and sex matched control. high calcium score.  severe heavy three-vessel disease  mild aortic atherosclerosis  Total chol 161, LDL 51 HBA1C 6.3  Syncope: 05/21/2017 On [employer] to bed 7pm Woke up on the floor Remembered went to bathroom, woke up on floor, dark in room Then went to bathroom again,  Feeling for light Woke up again in the bathtub, little knot on head, Got up out of bathroom,  Little dizzy,  Sugars were 80  Between 1/22 and 2/11 Was on BP medication, lisinopril 5 BP 88/50, running low consistently before lisinopril was stopped Jun 30, 2022  Father died 75s  cancer Mother Obese, DM, cad, stent age 31, now age 66, now pacer Brother CAD, PCi   PMH:   has a past medical history of CAD (coronary artery disease), Cataract, Diabetes mellitus without complication ([place]), Essential hypertension, Hyperlipidemia, Syncope, and Wears dentures.  PSH:    Past Surgical History:  Procedure Laterality Date  . APPENDECTOMY    . CATARACT EXTRACTION, BILATERAL     August 2017  . CORONARY STENT INTERVENTION N/A 08/02/2017   Procedure: CORONARY STENT INTERVENTION;  Surgeon: Wellington Hampshire, MD;  Location: [facility];  Service: Cardiovascular;  Laterality: N/A;  . LEFT HEART CATH AND CORONARY ANGIOGRAPHY Left 08/02/2017   Procedure: LEFT HEART CATH AND CORONARY ANGIOGRAPHY;  Surgeon: Minna Merritts, MD;  Location: [facility];  Service: Cardiovascular;  Laterality: Left;  Marland Kitchen MASS EXCISION Left 07/04/2017   Procedure: REMOVAL TWO CYST EAR EAR LOBE AND EXTERNAL EAR;  Surgeon: Beverly Gust, MD;  Location: [facility];  Service: ENT;  Laterality: Left;  LOCAL Diabetic - insulin and oral meds    Current Outpatient Medications  Medication Sig Dispense Refill  . aspirin EC 81 MG tablet Take 81 mg by mouth daily.    Marland Kitchen atorvastatin (LIPITOR) 40 MG tablet TAKE 1 TABLET BY MOUTH  DAILY AT 6 PM 90 tablet 3  . empagliflozin (JARDIANCE) 10 MG TABS tablet Take 10 mg by mouth daily. 30 tablet 1  . glucose blood (ONE TOUCH ULTRA TEST) test strip Check blood 2-3 times a day  200 each 12  . metFORMIN (GLUCOPHAGE) 1000 MG tablet TAKE 1 TABLET BY MOUTH TWO  TIMES DAILY WITH A MEAL 180 tablet 3  . metoprolol tartrate (LOPRESSOR) 25 MG tablet Take 0.5 tablets (12.5 mg total) by mouth 2 (two) times daily. 180 tablet 3  . vitamin B-12 (CYANOCOBALAMIN) 1000 MCG tablet Take 1,000 mcg by mouth daily.    . nitroGLYCERIN (NITROSTAT) 0.4 MG SL tablet Place 1 tablet (0.4 mg total) under the tongue every 5 (five) minutes as needed for chest pain. 25 tablet 3   No  current facility-administered medications for this visit.      Allergies:   Ace inhibitors and Neosporin [neomycin-bacitracin zn-polymyx]   Social History:  The patient  reports that he quit smoking about 7 years ago. He smoked 1.00 pack per day. He has never used smokeless tobacco. He reports current alcohol use of about 8.0 - 14.0 standard drinks of alcohol per week. He reports that he does not use drugs.   Family History:   family history includes Arrhythmia (age of onset: 56) in his mother; Cancer in his father; Diabetes in his mother.    Review of Systems: Review of Systems  Constitutional: Negative.   Respiratory: Negative.   Cardiovascular: Negative.   Gastrointestinal: Negative.   Musculoskeletal: Negative.   Neurological: Negative.   Psychiatric/Behavioral: Negative.   All other systems reviewed and are negative.   PHYSICAL EXAM: VS:  BP 140/72 (BP Location: Left Arm, Patient Position: Sitting, Cuff Size: Normal)   Pulse 69   Ht 5' 11.5" (1.816 m)   Wt 170 lb 8 oz (77.3 kg)   BMI 23.45 kg/m  , BMI Body mass index is 23.45 kg/m.  Constitutional:  oriented to person, place, and time. No distress.  HENT:  Head: Grossly normal Eyes:  no discharge. No scleral icterus.  Neck: No JVD, no carotid bruits  Cardiovascular: Regular rate and rhythm, no murmurs appreciated Pulmonary/Chest: Clear to auscultation bilaterally, no wheezes or rails Abdominal: Soft.  no distension.  no tenderness.  Musculoskeletal: Normal range of motion Neurological:  normal muscle tone. Coordination normal. No atrophy Skin: Skin warm and dry Psychiatric: normal affect, pleasant  Recent Labs: 02/18/2019: ALT 14; BUN 18; Creatinine, Ser 0.88; Hemoglobin 12.1; Platelets 217; Potassium 5.5; Sodium 139; TSH 2.750    Lipid Panel Lab Results  Component Value Date   CHOL 122 02/18/2019   HDL 70 02/18/2019   LDLCALC 38 02/18/2019   TRIG 65 02/18/2019      Wt Readings from Last 3 Encounters:   03/09/19 170 lb 8 oz (77.3 kg)  03/05/19 172 lb (78 kg)  02/18/19 172 lb (78 kg)       ASSESSMENT AND PLAN:  Type 2 diabetes mellitus without complication, with long-term current use of insulin ([place]) Managed by primary care,  Numbers well controlled, discussed with him  Syncope and collapse -  Recent stent placed to RCA No chest pain or shortness of breath, no near syncope or syncope  Essential hypertension, benign Blood pressure is well controlled on today's visit. No changes made to the medications.  CAD, Stable angina cardiac catheterization April 2019   stent to the proximal RCA Denies any anginal symptoms, no further testing  High cholesterol -  Numbers at goal, no medication changes made  Hyperkalemia Will avoid ACE inhibitors ARB Recommend he read about [place].  As a treatment option Avoid foods rich in potassium    Total encounter time more than 25 minutes  Greater  than 50% was spent in counseling and coordination of care with the patient    Orders Placed This Encounter  Procedures  . EKG 12-Lead     Signed, Esmond Plants, M.D., Ph.D. 03/09/2019  [place], [place]

## 2019-03-09 ENCOUNTER — Encounter: Payer: Self-pay | Admitting: Cardiovascular Disease

## 2019-03-09 ENCOUNTER — Ambulatory Visit (INDEPENDENT_AMBULATORY_CARE_PROVIDER_SITE_OTHER): Payer: Medicare Other | Admitting: Cardiovascular Disease

## 2019-03-09 ENCOUNTER — Other Ambulatory Visit: Payer: Self-pay

## 2019-03-09 VITALS — BP 140/72 | HR 69 | Ht 71.5 in | Wt 170.5 lb

## 2019-03-09 DIAGNOSIS — I10 Essential (primary) hypertension: Secondary | ICD-10-CM | POA: Diagnosis not present

## 2019-03-09 DIAGNOSIS — I25118 Atherosclerotic heart disease of native coronary artery with other forms of angina pectoris: Secondary | ICD-10-CM | POA: Diagnosis not present

## 2019-03-09 DIAGNOSIS — E7800 Pure hypercholesterolemia, unspecified: Secondary | ICD-10-CM

## 2019-03-09 DIAGNOSIS — E119 Type 2 diabetes mellitus without complications: Secondary | ICD-10-CM | POA: Diagnosis not present

## 2019-03-09 DIAGNOSIS — Z794 Long term (current) use of insulin: Secondary | ICD-10-CM

## 2019-03-09 MED ORDER — NITROGLYCERIN 0.4 MG SL SUBL: 0.4000 mg | SUBLINGUAL_TABLET | SUBLINGUAL | 3 refills | Status: DC | PRN

## 2019-03-09 NOTE — Patient Instructions (Signed)
Medication Instructions:  No changes  Read about: Pill for high potassium : valtessa   If you need a refill on your cardiac medications before your next appointment, please call your pharmacy.    Lab work: No new labs needed   If you have labs (blood work) drawn today and your tests are completely normal, you will receive your results only by: Marland Kitchen MyChart Message (if you have MyChart) OR . A paper copy in the mail If you have any lab test that is abnormal or we need to change your treatment, we will call you to review the results.   Testing/Procedures: No new testing needed   Follow-Up: At [facility], you and your health needs are our priority.  As part of our continuing mission to provide you with exceptional heart care, we have created designated Provider Care Teams.  These Care Teams include your primary Cardiologist (physician) and Advanced Practice Providers (APPs -  Physician Assistants and Nurse Practitioners) who all work together to provide you with the care you need, when you need it.  . You will need a follow up appointment in 12 months .   Please call our office 2 months in advance to schedule this appointment.    . Providers on your designated Care Team:   . Murray Hodgkins, NP . Christell Faith, PA-C . Marrianne Mood, PA-C  Any Other Special Instructions Will Be Listed Below (If Applicable).  For educational health videos Log in to : [email or web address] Or : [email or web address], password : [email or web address]

## 2019-03-17 ENCOUNTER — Other Ambulatory Visit: Payer: Self-pay

## 2019-03-18 ENCOUNTER — Ambulatory Visit: Payer: Self-pay | Admitting: Pharmacist

## 2019-03-18 ENCOUNTER — Encounter: Payer: Self-pay | Admitting: Nurse Practitioner

## 2019-03-18 ENCOUNTER — Ambulatory Visit (INDEPENDENT_AMBULATORY_CARE_PROVIDER_SITE_OTHER): Payer: Medicare Other | Admitting: Nurse Practitioner

## 2019-03-18 ENCOUNTER — Other Ambulatory Visit: Payer: Self-pay

## 2019-03-18 ENCOUNTER — Telehealth: Payer: Self-pay

## 2019-03-18 VITALS — BP 133/81 | HR 69 | Temp 97.7°F | Ht 71.5 in | Wt 167.0 lb

## 2019-03-18 DIAGNOSIS — E119 Type 2 diabetes mellitus without complications: Secondary | ICD-10-CM | POA: Diagnosis not present

## 2019-03-18 DIAGNOSIS — Z794 Long term (current) use of insulin: Secondary | ICD-10-CM | POA: Diagnosis not present

## 2019-03-18 MED ORDER — LANCET DEVICE MISC: 1.0000 [IU] | Freq: Every day | 11 refills | Status: AC | PRN

## 2019-03-18 MED ORDER — EMPAGLIFLOZIN 25 MG PO TABS: 25.0000 mg | ORAL_TABLET | Freq: Every day | ORAL | 1 refills | Status: DC

## 2019-03-18 NOTE — Patient Instructions (Signed)
Visit Information  Goals Addressed            This Visit's Progress     Patient Stated   . PharmD "I want to stay healthy" (pt-stated)       Current Barriers:  . Diabetes: controlled, but not optimally managed; most recent A1c 5.8%  o At last visit, started Jardiance 10 mg daily, d/c insulin d/t hypoglycemia and superior ASCVD benefit  o Notes he received and started Jardiance 11/3 . Current antihyperglycemic regimen: metformin 1000 mg BID, Jardiance 10 mg daily  o Notes hx Glyxambi (metformin/sitagliptin), pioglitazone (d/c d/t cardiac disease hx) . Denies hypoglycemia since stopping insulin . Current blood glucose readings: reports fastings 130-160s (though has only been on therapy for ~2 weeks) . Cardiovascular risk reduction (hx CAD s/p stent to RCA in 07/2017) o Current hypertensive regimen: metoprolol tartrate 12.5 mg BID (hx intolerance to ACEI d/t hyperkalemia); BP at goal <130/80 today in office - Visit w/ Dr Rockey Situ, discussed potential for [facility] in the future  o Current hyperlipidemia regimen: atorvastatin 40 mg daily; lipids pending today o Antiplatelet regimen: ASA 81 mg (completed 1 year of DAPT w/ clopidogrel)  Pharmacist Clinical Goal(s):  Marland Kitchen Over the next 90 days, patient will work with PharmD and primary care provider to address optimized medication management  Interventions: . Comprehensive medication review performed, medication list updated in electronic medical record . Collaborated w/ Merrie Roof, PA. Increased Jardiance to 25 mg daily. Counseled patient that he could take 2 tab of Jardiance 10 mg daily in the interim . Patient asked if metformin can cause hyperkalemia. Discused that none of his medications can cause hyperkalemia. Discussed avoiding high potassium foods  Patient Self Care Activities:  . Patient will check blood glucose daily , document, and provide at future appointments . Patient will take medications as prescribed . Patient will report any  questions or concerns to provider   Please see past updates related to this goal by clicking on the "Past Updates" button in the selected goal         The patient verbalized understanding of instructions provided today and declined a print copy of patient instruction materials.   Plan: - Offered phone call f/u in 4 weeks, patient declined. Will follow up with patient at next office visit with Marnee Guarneri, NP in ~8 weeks  Catie Darnelle Maffucci, PharmD Clinical Pharmacist [place] [phone]

## 2019-03-18 NOTE — Progress Notes (Signed)
BP 133/81   Pulse 69   Temp 97.7 F (36.5 C) (Oral)   Ht 5' 11.5" (1.816 m)   Wt 167 lb (75.8 kg)   SpO2 97%   BMI 22.97 kg/m    Subjective:    Patient ID: Johnny Wolfe, male    DOB: [birth year], 73 y.o.   MRN: [id number]  HPI: Johnny Wolfe is a 73 y.o. male  Chief Complaint  Patient presents with  . Diabetes  . Medication Refill    lancets    Home BSs around 150s-160s since stopping insulin due to some hypoglycemic episodes.   Relevant past medical, surgical, family and social history reviewed and updated as indicated. Interim medical history since our last visit reviewed. Allergies and medications reviewed and updated.  Review of Systems  Per HPI unless specifically indicated above     Objective:    BP 133/81   Pulse 69   Temp 97.7 F (36.5 C) (Oral)   Ht 5' 11.5" (1.816 m)   Wt 167 lb (75.8 kg)   SpO2 97%   BMI 22.97 kg/m   Wt Readings from Last 3 Encounters:  03/18/19 167 lb (75.8 kg)  03/09/19 170 lb 8 oz (77.3 kg)  03/05/19 172 lb (78 kg)    Physical Exam  Results for orders placed or performed in visit on 02/18/19  Bayer DCA Hb A1c Waived  Result Value Ref Range   HB A1C (BAYER DCA - WAIVED) 5.8 <7.0 %  CBC with Differential/Platelet  Result Value Ref Range   WBC 6.2 3.4 - 10.8 x10E3/uL   RBC 3.87 (L) 4.14 - 5.80 x10E6/uL   Hemoglobin 12.1 (L) 13.0 - 17.7 g/dL   Hematocrit 36.4 (L) 37.5 - 51.0 %   MCV 94 79 - 97 fL   MCH 31.3 26.6 - 33.0 pg   MCHC 33.2 31.5 - 35.7 g/dL   RDW 13.2 11.6 - 15.4 %   Platelets 217 150 - 450 x10E3/uL   Neutrophils 63 Not Estab. %   Lymphs 24 Not Estab. %   Monocytes 8 Not Estab. %   Eos 4 Not Estab. %   Basos 1 Not Estab. %   Neutrophils Absolute 3.9 1.4 - 7.0 x10E3/uL   Lymphocytes Absolute 1.5 0.7 - 3.1 x10E3/uL   Monocytes Absolute 0.5 0.1 - 0.9 x10E3/uL   EOS (ABSOLUTE) 0.2 0.0 - 0.4 x10E3/uL   Basophils Absolute 0.1 0.0 - 0.2 x10E3/uL   Immature Granulocytes 0 Not Estab. %   Immature Grans (Abs) 0.0  0.0 - 0.1 x10E3/uL  Lipid Panel w/o Chol/HDL Ratio  Result Value Ref Range   Cholesterol, Total 122 100 - 199 mg/dL   Triglycerides 65 0 - 149 mg/dL   HDL 70 >39 mg/dL   VLDL Cholesterol Cal 14 5 - 40 mg/dL   LDL Chol Calc (NIH) 38 0 - 99 mg/dL  Comprehensive metabolic panel  Result Value Ref Range   Glucose 102 (H) 65 - 99 mg/dL   BUN 18 8 - 27 mg/dL   Creatinine, Ser 0.88 0.76 - 1.27 mg/dL   GFR calc non Af Amer 86 >59 mL/min/1.73   GFR calc Af Amer 100 >59 mL/min/1.73   BUN/Creatinine Ratio 20 10 - 24   Sodium 139 134 - 144 mmol/L   Potassium 5.5 (H) 3.5 - 5.2 mmol/L   Chloride 101 96 - 106 mmol/L   CO2 23 20 - 29 mmol/L   Calcium 9.4 8.6 - 10.2 mg/dL   Total Protein 6.5  6.0 - 8.5 g/dL   Albumin 4.5 3.7 - 4.7 g/dL   Globulin, Total 2.0 1.5 - 4.5 g/dL   Albumin/Globulin Ratio 2.3 (H) 1.2 - 2.2   Bilirubin Total 0.7 0.0 - 1.2 mg/dL   Alkaline Phosphatase 108 39 - 117 IU/L   AST 21 0 - 40 IU/L   ALT 14 0 - 44 IU/L  TSH  Result Value Ref Range   TSH 2.750 0.450 - 4.500 uIU/mL      Assessment & Plan:   Problem List Items Addressed This Visit      Endocrine   Type 2 diabetes mellitus without complication ([place]) - Primary   Relevant Medications   empagliflozin (JARDIANCE) 25 MG TABS tablet       Follow up plan: Return in about 2 months (around 05/18/2019) for DM recheck with Jolene.

## 2019-03-18 NOTE — Chronic Care Management (AMB) (Signed)
Chronic Care Management   Follow Up Note   03/18/2019 Name: Johnny Wolfe MRN: [id number] DOB: [birth year]  Referred by: Venita Lick, NP Reason for referral : Chronic Care Management (Medication Management)   Johnny Wolfe is a 73 y.o. year old male who is a primary care patient of Cannady, Barbaraann Faster, NP. The CCM team was consulted for assistance with chronic disease management and care coordination needs.    Met with patient face to face w/ Merrie Roof, PA.   Review of patient status, including review of consultants reports, relevant laboratory and other test results, and collaboration with appropriate care team members and the patient's provider was performed as part of comprehensive patient evaluation and provision of chronic care management services.    SDOH (Social Determinants of Health) screening performed today: None. See Care Plan for related entries.   Outpatient Encounter Medications as of 03/18/2019  Medication Sig  . [DISCONTINUED] empagliflozin (JARDIANCE) 10 MG TABS tablet Take 10 mg by mouth daily.  Marland Kitchen aspirin EC 81 MG tablet Take 81 mg by mouth daily.  Marland Kitchen atorvastatin (LIPITOR) 40 MG tablet TAKE 1 TABLET BY MOUTH  DAILY AT 6 PM  . glucose blood (ONE TOUCH ULTRA TEST) test strip Check blood 2-3 times a day  . metFORMIN (GLUCOPHAGE) 1000 MG tablet TAKE 1 TABLET BY MOUTH TWO  TIMES DAILY WITH A MEAL  . metoprolol tartrate (LOPRESSOR) 25 MG tablet Take 0.5 tablets (12.5 mg total) by mouth 2 (two) times daily.  . nitroGLYCERIN (NITROSTAT) 0.4 MG SL tablet Place 1 tablet (0.4 mg total) under the tongue every 5 (five) minutes as needed for chest pain.  . vitamin B-12 (CYANOCOBALAMIN) 1000 MCG tablet Take 1,000 mcg by mouth daily.   No facility-administered encounter medications on file as of 03/18/2019.      Goals Addressed            This Visit's Progress     Patient Stated   . PharmD "I want to stay healthy" (pt-stated)       Current Barriers:  . Diabetes:  controlled, but not optimally managed; most recent A1c 5.8%  o At last visit, started Jardiance 10 mg daily, d/c insulin d/t hypoglycemia and superior ASCVD benefit  o Notes he received and started Jardiance 11/3 . Current antihyperglycemic regimen: metformin 1000 mg BID, Jardiance 10 mg daily  o Notes hx Glyxambi (metformin/sitagliptin), pioglitazone (d/c d/t cardiac disease hx) . Denies hypoglycemia since stopping insulin . Current blood glucose readings: reports fastings 130-160s (though has only been on therapy for ~2 weeks) . Cardiovascular risk reduction (hx CAD s/p stent to RCA in 07/2017) o Current hypertensive regimen: metoprolol tartrate 12.5 mg BID (hx intolerance to ACEI d/t hyperkalemia); BP at goal <130/80 today in office - Visit w/ Dr Rockey Situ, discussed potential for [facility] in the future  o Current hyperlipidemia regimen: atorvastatin 40 mg daily; lipids pending today o Antiplatelet regimen: ASA 81 mg (completed 1 year of DAPT w/ clopidogrel)  Pharmacist Clinical Goal(s):  Marland Kitchen Over the next 90 days, patient will work with PharmD and primary care provider to address optimized medication management  Interventions: . Comprehensive medication review performed, medication list updated in electronic medical record . Collaborated w/ Merrie Roof, PA. Increased Jardiance to 25 mg daily. Counseled patient that he could take 2 tab of Jardiance 10 mg daily in the interim . Patient asked if metformin can cause hyperkalemia. Discused that none of his medications can cause hyperkalemia. Discussed avoiding high potassium foods  Patient Self Care Activities:  . Patient will check blood glucose daily , document, and provide at future appointments . Patient will take medications as prescribed . Patient will report any questions or concerns to provider   Please see past updates related to this goal by clicking on the "Past Updates" button in the selected goal         Plan: - Offered phone  call f/u in 4 weeks, patient declined. Will follow up with patient at next office visit with Marnee Guarneri, NP in ~8 weeks  Catie Darnelle Maffucci, PharmD Clinical Pharmacist [place] [phone]

## 2019-03-23 ENCOUNTER — Encounter: Payer: Self-pay | Admitting: Nurse Practitioner

## 2019-03-29 ENCOUNTER — Encounter: Payer: Self-pay | Admitting: Nurse Practitioner

## 2019-03-29 LAB — IFOBT (OCCULT BLOOD): IFOBT: NEGATIVE

## 2019-04-14 ENCOUNTER — Other Ambulatory Visit: Payer: Self-pay

## 2019-04-14 ENCOUNTER — Encounter: Payer: Self-pay | Admitting: Nurse Practitioner

## 2019-04-14 ENCOUNTER — Ambulatory Visit (INDEPENDENT_AMBULATORY_CARE_PROVIDER_SITE_OTHER): Payer: Medicare Other | Admitting: Nurse Practitioner

## 2019-04-14 DIAGNOSIS — E119 Type 2 diabetes mellitus without complications: Secondary | ICD-10-CM

## 2019-04-14 DIAGNOSIS — Z794 Long term (current) use of insulin: Secondary | ICD-10-CM | POA: Diagnosis not present

## 2019-04-14 NOTE — Patient Instructions (Signed)
Semaglutide injection solution What is this medicine? SEMAGLUTIDE (Sem a GLOO tide) is used to improve blood sugar control in adults with type 2 diabetes. This medicine may be used with other diabetes medicines. This drug may also reduce the risk of heart attack or stroke if you have type 2 diabetes and risk factors for heart disease. This medicine may be used for other purposes; ask your health care provider or pharmacist if you have questions. COMMON BRAND NAME(S): OZEMPIC What should I tell my health care provider before I take this medicine? They need to know if you have any of these conditions:  endocrine tumors (MEN 2) or if someone in your family had these tumors  eye disease, vision problems  history of pancreatitis  kidney disease  stomach problems  thyroid cancer or if someone in your family had thyroid cancer  an unusual or allergic reaction to semaglutide, other medicines, foods, dyes, or preservatives  pregnant or trying to get pregnant  breast-feeding How should I use this medicine? This medicine is for injection under the skin of your upper leg (thigh), stomach area, or upper arm. It is given once every week (every 7 days). You will be taught how to prepare and give this medicine. Use exactly as directed. Take your medicine at regular intervals. Do not take it more often than directed. If you use this medicine with insulin, you should inject this medicine and the insulin separately. Do not mix them together. Do not give the injections right next to each other. Change (rotate) injection sites with each injection. It is important that you put your used needles and syringes in a special sharps container. Do not put them in a trash can. If you do not have a sharps container, call your pharmacist or healthcare provider to get one. A special MedGuide will be given to you by the pharmacist with each prescription and refill. Be sure to read this information carefully each  time. Talk to your pediatrician regarding the use of this medicine in children. Special care may be needed. Overdosage: If you think you have taken too much of this medicine contact a poison control center or emergency room at once. NOTE: This medicine is only for you. Do not share this medicine with others. What if I miss a dose? If you miss a dose, take it as soon as you can within 5 days after the missed dose. Then take your next dose at your regular weekly time. If it has been longer than 5 days after the missed dose, do not take the missed dose. Take the next dose at your regular time. Do not take double or extra doses. If you have questions about a missed dose, contact your health care provider for advice. What may interact with this medicine?  other medicines for diabetes Many medications may cause changes in blood sugar, these include:  alcohol containing beverages  antiviral medicines for HIV or AIDS  aspirin and aspirin-like drugs  certain medicines for blood pressure, heart disease, irregular heart beat  chromium  diuretics  male hormones, such as estrogens or progestins, birth control pills  fenofibrate  gemfibrozil  isoniazid  lanreotide  male hormones or anabolic steroids  MAOIs like Carbex, Eldepryl, Marplan, Nardil, and Parnate  medicines for weight loss  medicines for allergies, asthma, cold, or cough  medicines for depression, anxiety, or psychotic disturbances  niacin  nicotine  NSAIDs, medicines for pain and inflammation, like ibuprofen or naproxen  octreotide  pasireotide  pentamidine  phenytoin  probenecid  quinolone antibiotics such as ciprofloxacin, levofloxacin, ofloxacin  some herbal dietary supplements  steroid medicines such as prednisone or cortisone  sulfamethoxazole; trimethoprim  thyroid hormones Some medications can hide the warning symptoms of low blood sugar (hypoglycemia). You may need to monitor your blood  sugar more closely if you are taking one of these medications. These include:  beta-blockers, often used for high blood pressure or heart problems (examples include atenolol, metoprolol, propranolol)  clonidine  guanethidine  reserpine This list may not describe all possible interactions. Give your health care provider a list of all the medicines, herbs, non-prescription drugs, or dietary supplements you use. Also tell them if you smoke, drink alcohol, or use illegal drugs. Some items may interact with your medicine. What should I watch for while using this medicine? Visit your doctor or health care professional for regular checks on your progress. Drink plenty of fluids while taking this medicine. Check with your doctor or health care professional if you get an attack of severe diarrhea, nausea, and vomiting. The loss of too much body fluid can make it dangerous for you to take this medicine. A test called the HbA1C (A1C) will be monitored. This is a simple blood test. It measures your blood sugar control over the last 2 to 3 months. You will receive this test every 3 to 6 months. Learn how to check your blood sugar. Learn the symptoms of low and high blood sugar and how to manage them. Always carry a quick-source of sugar with you in case you have symptoms of low blood sugar. Examples include hard sugar candy or glucose tablets. Make sure others know that you can choke if you eat or drink when you develop serious symptoms of low blood sugar, such as seizures or unconsciousness. They must get medical help at once. Tell your doctor or health care professional if you have high blood sugar. You might need to change the dose of your medicine. If you are sick or exercising more than usual, you might need to change the dose of your medicine. Do not skip meals. Ask your doctor or health care professional if you should avoid alcohol. Many nonprescription cough and cold products contain sugar or alcohol.  These can affect blood sugar. Pens should never be shared. Even if the needle is changed, sharing may result in passing of viruses like hepatitis or HIV. Wear a medical ID bracelet or chain, and carry a card that describes your disease and details of your medicine and dosage times. Do not become pregnant while taking this medicine. Women should inform their doctor if they wish to become pregnant or think they might be pregnant. There is a potential for serious side effects to an unborn child. Talk to your health care professional or pharmacist for more information. What side effects may I notice from receiving this medicine? Side effects that you should report to your doctor or health care professional as soon as possible:  allergic reactions like skin rash, itching or hives, swelling of the face, lips, or tongue  breathing problems  changes in vision  diarrhea that continues or is severe  lump or swelling on the neck  severe nausea  signs and symptoms of infection like fever or chills; cough; sore throat; pain or trouble passing urine  signs and symptoms of low blood sugar such as feeling anxious, confusion, dizziness, increased hunger, unusually weak or tired, sweating, shakiness, cold, irritable, headache, blurred vision, fast heartbeat, loss of consciousness  signs and symptoms of kidney injury like trouble passing urine or change in the amount of urine  trouble swallowing  unusual stomach upset or pain  vomiting Side effects that usually do not require medical attention (report to your doctor or health care professional if they continue or are bothersome):  constipation  diarrhea  nausea  pain, redness, or irritation at site where injected  stomach upset This list may not describe all possible side effects. Call your doctor for medical advice about side effects. You may report side effects to FDA at 1-800-FDA-1088. Where should I keep my medicine? Keep out of the reach  of children. Store unopened pens in a refrigerator between 2 and 8 degrees C (36 and 46 degrees F). Do not freeze. Protect from light and heat. After you first use the pen, it can be stored for 56 days at room temperature between 15 and 30 degrees C (59 and 86 degrees F) or in a refrigerator. Throw away your used pen after 56 days or after the expiration date, whichever comes first. Do not store your pen with the needle attached. If the needle is left on, medicine may leak from the pen. NOTE: This sheet is a summary. It may not cover all possible information. If you have questions about this medicine, talk to your doctor, pharmacist, or health care provider.  2020 Elsevier/Gold Standard (2018-05-16 14:25:32)

## 2019-04-14 NOTE — Assessment & Plan Note (Signed)
Chronic, ongoing.  No further lows with discontinuation of Lantus.  Will continue Jardiance and Metformin + add on Ozempic.  Provided sample in office today and gave first injection.  Continue to monitor BS at home and add on a post prandial check.  He is scheduled to return in January for follow-up.  May need to consider return to Lantus if poor control with alternate options.

## 2019-04-14 NOTE — Progress Notes (Signed)
BP 115/69   Pulse 76   Temp 97.6 F (36.4 C) (Oral)   SpO2 95%    Subjective:    Patient ID: Johnny Wolfe, male    DOB: 1945-05-02, 73 y.o.   MRN: [id number]  HPI: Johnny Wolfe is a 73 y.o. male  Chief Complaint  Patient presents with  . Medication Problem    pt states he feels like the Vania Rea is not working for him   DIABETES Jardiance was increased on 03/18/19 to 25 MG, continues on Metformin 1000 MG BID.  Was discontinued off Lantus in October due to low BS readings. Hypoglycemic episodes:no Polydipsia/polyuria: no Visual disturbance: no Chest pain: no Paresthesias: no Glucose Monitoring: yes  Accucheck frequency: Daily  Fasting glucose: 159 this morning, 112 to 189  Post prandial:  Evening:  Before meals: Taking Insulin?: no  Long acting insulin:  Short acting insulin: Blood Pressure Monitoring: daily Retinal Examination: Up to Date Foot Exam: Up to Date Pneumovax: Up to Date Influenza: Up to Date Aspirin: yes  Relevant past medical, surgical, family and social history reviewed and updated as indicated. Interim medical history since our last visit reviewed. Allergies and medications reviewed and updated.  Review of Systems  Constitutional: Negative.   Respiratory: Negative for cough, chest tightness, shortness of breath and wheezing.   Cardiovascular: Negative for chest pain, palpitations and leg swelling.  Gastrointestinal: Negative.   Endocrine: Negative for cold intolerance, heat intolerance, polydipsia, polyphagia and polyuria.  Neurological: Negative.   Psychiatric/Behavioral: Negative.     Per HPI unless specifically indicated above     Objective:    BP 115/69   Pulse 76   Temp 97.6 F (36.4 C) (Oral)   SpO2 95%   Wt Readings from Last 3 Encounters:  03/18/19 167 lb (75.8 kg)  03/09/19 170 lb 8 oz (77.3 kg)  03/05/19 172 lb (78 kg)    Physical Exam Vitals and nursing note reviewed.  Constitutional:      General: He is awake. He is  not in acute distress.    Appearance: He is well-developed. He is not ill-appearing.  HENT:     Head: Normocephalic and atraumatic.     Right Ear: Hearing normal. No drainage.     Left Ear: Hearing normal. No drainage.  Eyes:     General: Lids are normal.        Right eye: No discharge.        Left eye: No discharge.     Conjunctiva/sclera: Conjunctivae normal.     Pupils: Pupils are equal, round, and reactive to light.  Neck:     Vascular: No carotid bruit.  Cardiovascular:     Rate and Rhythm: Normal rate and regular rhythm.     Heart sounds: Normal heart sounds, S1 normal and S2 normal. No murmur. No gallop.   Pulmonary:     Effort: Pulmonary effort is normal. No accessory muscle usage or respiratory distress.     Breath sounds: Normal breath sounds.  Abdominal:     General: Bowel sounds are normal.     Palpations: Abdomen is soft.  Musculoskeletal:        General: Normal range of motion.     Cervical back: Normal range of motion and neck supple.     Right lower leg: No edema.     Left lower leg: No edema.  Skin:    General: Skin is warm and dry.  Neurological:     Mental Status: He is alert  and oriented to person, place, and time.  Psychiatric:        Mood and Affect: Mood normal.        Behavior: Behavior normal. Behavior is cooperative.        Thought Content: Thought content normal.        Judgment: Judgment normal.     Results for orders placed or performed in visit on 02/18/19  Bayer DCA Hb A1c Waived  Result Value Ref Range   HB A1C (BAYER DCA - WAIVED) 5.8 <7.0 %  CBC with Differential/Platelet  Result Value Ref Range   WBC 6.2 3.4 - 10.8 x10E3/uL   RBC 3.87 (L) 4.14 - 5.80 x10E6/uL   Hemoglobin 12.1 (L) 13.0 - 17.7 g/dL   Hematocrit 36.4 (L) 37.5 - 51.0 %   MCV 94 79 - 97 fL   MCH 31.3 26.6 - 33.0 pg   MCHC 33.2 31.5 - 35.7 g/dL   RDW 13.2 11.6 - 15.4 %   Platelets 217 150 - 450 x10E3/uL   Neutrophils 63 Not Estab. %   Lymphs 24 Not Estab. %    Monocytes 8 Not Estab. %   Eos 4 Not Estab. %   Basos 1 Not Estab. %   Neutrophils Absolute 3.9 1.4 - 7.0 x10E3/uL   Lymphocytes Absolute 1.5 0.7 - 3.1 x10E3/uL   Monocytes Absolute 0.5 0.1 - 0.9 x10E3/uL   EOS (ABSOLUTE) 0.2 0.0 - 0.4 x10E3/uL   Basophils Absolute 0.1 0.0 - 0.2 x10E3/uL   Immature Granulocytes 0 Not Estab. %   Immature Grans (Abs) 0.0 0.0 - 0.1 x10E3/uL  Lipid Panel w/o Chol/HDL Ratio  Result Value Ref Range   Cholesterol, Total 122 100 - 199 mg/dL   Triglycerides 65 0 - 149 mg/dL   HDL 70 >39 mg/dL   VLDL Cholesterol Cal 14 5 - 40 mg/dL   LDL Chol Calc (NIH) 38 0 - 99 mg/dL  Comprehensive metabolic panel  Result Value Ref Range   Glucose 102 (H) 65 - 99 mg/dL   BUN 18 8 - 27 mg/dL   Creatinine, Ser 0.88 0.76 - 1.27 mg/dL   GFR calc non Af Amer 86 >59 mL/min/1.73   GFR calc Af Amer 100 >59 mL/min/1.73   BUN/Creatinine Ratio 20 10 - 24   Sodium 139 134 - 144 mmol/L   Potassium 5.5 (H) 3.5 - 5.2 mmol/L   Chloride 101 96 - 106 mmol/L   CO2 23 20 - 29 mmol/L   Calcium 9.4 8.6 - 10.2 mg/dL   Total Protein 6.5 6.0 - 8.5 g/dL   Albumin 4.5 3.7 - 4.7 g/dL   Globulin, Total 2.0 1.5 - 4.5 g/dL   Albumin/Globulin Ratio 2.3 (H) 1.2 - 2.2   Bilirubin Total 0.7 0.0 - 1.2 mg/dL   Alkaline Phosphatase 108 39 - 117 IU/L   AST 21 0 - 40 IU/L   ALT 14 0 - 44 IU/L  TSH  Result Value Ref Range   TSH 2.750 0.450 - 4.500 uIU/mL      Assessment & Plan:   Problem List Items Addressed This Visit      Endocrine   Type 2 diabetes mellitus without complication ([facility])    Chronic, ongoing.  No further lows with discontinuation of Lantus.  Will continue Jardiance and Metformin + add on Ozempic.  Provided sample in office today and gave first injection.  Continue to monitor BS at home and add on a post prandial check.  He is scheduled to  return in January for follow-up.  May need to consider return to Lantus if poor control with alternate options.          Follow up  plan: Return for as scheduled in January.

## 2019-04-17 LAB — HM DIABETES EYE EXAM

## 2019-05-12 ENCOUNTER — Other Ambulatory Visit: Payer: Self-pay | Admitting: Nurse Practitioner

## 2019-05-12 ENCOUNTER — Emergency Department: Payer: PPO

## 2019-05-12 ENCOUNTER — Encounter: Payer: Self-pay | Admitting: Emergency Medicine

## 2019-05-12 ENCOUNTER — Emergency Department
Admission: EM | Admit: 2019-05-12 | Discharge: 2019-05-12 | Disposition: A | Discharge status: Home / Self Care | Payer: PPO | Attending: Emergency Medicine | Admitting: Emergency Medicine

## 2019-05-12 ENCOUNTER — Other Ambulatory Visit: Payer: Self-pay

## 2019-05-12 ENCOUNTER — Telehealth: Payer: Self-pay | Admitting: Pharmacist

## 2019-05-12 DIAGNOSIS — Z7982 Long term (current) use of aspirin: Secondary | ICD-10-CM | POA: Diagnosis not present

## 2019-05-12 DIAGNOSIS — I2510 Atherosclerotic heart disease of native coronary artery without angina pectoris: Secondary | ICD-10-CM | POA: Diagnosis not present

## 2019-05-12 DIAGNOSIS — S0231XA Fracture of orbital floor, right side, initial encounter for closed fracture: Secondary | ICD-10-CM | POA: Diagnosis not present

## 2019-05-12 DIAGNOSIS — E119 Type 2 diabetes mellitus without complications: Secondary | ICD-10-CM | POA: Diagnosis not present

## 2019-05-12 DIAGNOSIS — I152 Hypertension secondary to endocrine disorders: Secondary | ICD-10-CM

## 2019-05-12 DIAGNOSIS — E1159 Type 2 diabetes mellitus with other circulatory complications: Secondary | ICD-10-CM

## 2019-05-12 DIAGNOSIS — S80811A Abrasion, right lower leg, initial encounter: Secondary | ICD-10-CM | POA: Diagnosis not present

## 2019-05-12 DIAGNOSIS — Z955 Presence of coronary angioplasty implant and graft: Secondary | ICD-10-CM | POA: Diagnosis not present

## 2019-05-12 DIAGNOSIS — I10 Essential (primary) hypertension: Secondary | ICD-10-CM | POA: Insufficient documentation

## 2019-05-12 DIAGNOSIS — Y92013 Bedroom of single-family (private) house as the place of occurrence of the external cause: Secondary | ICD-10-CM | POA: Insufficient documentation

## 2019-05-12 DIAGNOSIS — E538 Deficiency of other specified B group vitamins: Secondary | ICD-10-CM

## 2019-05-12 DIAGNOSIS — S02841A Fracture of lateral orbital wall, right side, initial encounter for closed fracture: Secondary | ICD-10-CM | POA: Diagnosis not present

## 2019-05-12 DIAGNOSIS — W01198A Fall on same level from slipping, tripping and stumbling with subsequent striking against other object, initial encounter: Secondary | ICD-10-CM | POA: Diagnosis not present

## 2019-05-12 DIAGNOSIS — Z87891 Personal history of nicotine dependence: Secondary | ICD-10-CM | POA: Insufficient documentation

## 2019-05-12 DIAGNOSIS — S0240CA Maxillary fracture, right side, initial encounter for closed fracture: Secondary | ICD-10-CM | POA: Diagnosis not present

## 2019-05-12 DIAGNOSIS — W19XXXA Unspecified fall, initial encounter: Secondary | ICD-10-CM

## 2019-05-12 DIAGNOSIS — H05221 Edema of right orbit: Secondary | ICD-10-CM | POA: Diagnosis not present

## 2019-05-12 DIAGNOSIS — I6523 Occlusion and stenosis of bilateral carotid arteries: Secondary | ICD-10-CM | POA: Diagnosis not present

## 2019-05-12 DIAGNOSIS — Z7984 Long term (current) use of oral hypoglycemic drugs: Secondary | ICD-10-CM | POA: Diagnosis not present

## 2019-05-12 DIAGNOSIS — Y998 Other external cause status: Secondary | ICD-10-CM | POA: Insufficient documentation

## 2019-05-12 DIAGNOSIS — S02402A Zygomatic fracture, unspecified, initial encounter for closed fracture: Secondary | ICD-10-CM | POA: Diagnosis not present

## 2019-05-12 DIAGNOSIS — Y9301 Activity, walking, marching and hiking: Secondary | ICD-10-CM | POA: Diagnosis not present

## 2019-05-12 DIAGNOSIS — G319 Degenerative disease of nervous system, unspecified: Secondary | ICD-10-CM | POA: Diagnosis not present

## 2019-05-12 DIAGNOSIS — Z79899 Other long term (current) drug therapy: Secondary | ICD-10-CM | POA: Diagnosis not present

## 2019-05-12 DIAGNOSIS — S0240EA Zygomatic fracture, right side, initial encounter for closed fracture: Secondary | ICD-10-CM | POA: Diagnosis not present

## 2019-05-12 DIAGNOSIS — T797XXA Traumatic subcutaneous emphysema, initial encounter: Secondary | ICD-10-CM | POA: Diagnosis not present

## 2019-05-12 DIAGNOSIS — S199XXA Unspecified injury of neck, initial encounter: Secondary | ICD-10-CM | POA: Diagnosis not present

## 2019-05-12 DIAGNOSIS — S0990XA Unspecified injury of head, initial encounter: Secondary | ICD-10-CM | POA: Diagnosis not present

## 2019-05-12 DIAGNOSIS — E1169 Type 2 diabetes mellitus with other specified complication: Secondary | ICD-10-CM

## 2019-05-12 MED ORDER — OXYCODONE-ACETAMINOPHEN 5-325 MG PO TABS: 1.0000 | ORAL_TABLET | ORAL | 0 refills | Status: DC | PRN

## 2019-05-12 MED ORDER — OXYCODONE-ACETAMINOPHEN 5-325 MG PO TABS
1.0000 | ORAL_TABLET | Freq: Once | ORAL | Status: AC
  Administered 2019-05-12: 1 via ORAL
  Filled 2019-05-12: qty 1

## 2019-05-12 NOTE — ED Triage Notes (Signed)
Patient ambulatory to triage with steady gait, without difficulty or distress noted, mask in place; pt reports falling getting OOB; st his foot got caught on the rug and he hit the floor; denies LOC; abrasion to rt shin; c/o frontal HA and pain to rt cheek; no cervical or spinal tenderness with palpation reported

## 2019-05-12 NOTE — Progress Notes (Signed)
Lab orders only 

## 2019-05-12 NOTE — ED Notes (Signed)
Pt laying in bed at this time, no issues with transfer from wheelchair to bed. NAD, bed locked and low, call light in reach.

## 2019-05-12 NOTE — Telephone Encounter (Signed)
Patient may have been referring to the appointment w/ Jolene? (See My Chart). It looks like his appointment w/ Jolene will be an in person appointment. I can meet with him during her appointment. I have changed my visit to in person, rather than telephone.   Could someone please call patient and make sure that this addresses the below question?   Copied from [place] [phone]. Topic: General - Inquiry >> May 12, 2019  2:15 PM Virl Axe D wrote: Reason for CRM: Pt would like to meet with the pharmacist for his appt on 05/19/19 in person rather than over the phone. Please advise.

## 2019-05-12 NOTE — ED Provider Notes (Signed)
[facility] Emergency Department Provider Note   ____________________________________________   First MD Initiated Contact with Patient 05/12/19 [phone]     (approximate)  I have reviewed the triage vital signs and the nursing notes.   HISTORY  Chief Complaint Fall    HPI Johnny Wolfe is a 74 y.o. male with past medical history of CAD, diabetes, hypertension, and hyperlipidemia who presents to the ED complaining of fall.  Patient reports that a few hours prior to arrival he tripped on part of his rug, falling and striking the right side of his face on the floor.  He did not lose consciousness, but complains of pain over his right cheek as well as a diffuse headache.  He suffered an abrasion to his right shin, but has been able to ambulate without pain since then, states his tetanus was updated within the past 5 years.  He has felt what feels like a deformity to the area of his right cheek, but has not had any pain or swelling in his eye, denies any pain with eye movements.  He is not anticoagulated.        Past Medical History:  Diagnosis Date  . CAD (coronary artery disease)    a. 06/2017 Cardiac CT: Ca2+ score of 2310 - 95th%'ile;  b. 07/2017 Cath/PCI: LM nl, LAD 60p, 48m, LCX 60p, OM2 40p, RCA dominant, 80p (3.5x15 [place] DES), 79m, 30d, EF 55-65%.  . Cataract   . Diabetes mellitus without complication ([place])   . Essential hypertension   . Hyperlipidemia   . Syncope   . Wears dentures    full upper and lower    Patient Active Problem List   Diagnosis Date Noted  . Atopic dermatitis 05/01/2018  . Keratosis 11/26/2017  . Advanced care planning/counseling discussion 11/06/2017  . Coronary artery disease involving native coronary artery with angina pectoris ([place]) 08/02/2017  . Abnormal screening cardiac CT 08/02/2017  . Ear cysts 06/10/2017  . Type 2 diabetes mellitus without complication ([place]) [id number]  . Hypertension associated with diabetes ([place])  10/19/2016  . Hyperlipidemia associated with type 2 diabetes mellitus ([place]) 10/19/2016    Past Surgical History:  Procedure Laterality Date  . APPENDECTOMY    . CATARACT EXTRACTION, BILATERAL     August 2017  . CORONARY STENT INTERVENTION N/A 08/02/2017   Procedure: CORONARY STENT INTERVENTION;  Surgeon: Wellington Hampshire, MD;  Location: [place] [facility];  Service: Cardiovascular;  Laterality: N/A;  . LEFT HEART CATH AND CORONARY ANGIOGRAPHY Left 08/02/2017   Procedure: LEFT HEART CATH AND CORONARY ANGIOGRAPHY;  Surgeon: Minna Merritts, MD;  Location: [place] [facility];  Service: Cardiovascular;  Laterality: Left;  Marland Kitchen MASS EXCISION Left 07/04/2017   Procedure: REMOVAL TWO CYST EAR EAR LOBE AND EXTERNAL EAR;  Surgeon: Beverly Gust, MD;  Location: [place];  Service: ENT;  Laterality: Left;  LOCAL Diabetic - insulin and oral meds    Prior to Admission medications   Medication Sig Start Date End Date Taking? Authorizing Provider  aspirin EC 81 MG tablet Take 81 mg by mouth daily.    [provider]  atorvastatin (LIPITOR) 40 MG tablet TAKE 1 TABLET BY MOUTH  DAILY AT 6 PM 02/19/19   Cannady, Henrine Screws T, NP  empagliflozin (JARDIANCE) 25 MG TABS tablet Take 25 mg by mouth daily before breakfast. 03/18/19   Volney American, PA-C  glucose blood (ONE TOUCH ULTRA TEST) test strip Check blood 2-3 times a day 02/18/19  Venita Lick, NP  Lancet Device MISC 1 Units by Does not apply route daily as needed. 03/18/19   Volney American, PA-C  metFORMIN (GLUCOPHAGE) 1000 MG tablet TAKE 1 TABLET BY MOUTH TWO  TIMES DAILY WITH A MEAL 02/19/19   Cannady, Jolene T, NP  metoprolol tartrate (LOPRESSOR) 25 MG tablet Take 0.5 tablets (12.5 mg total) by mouth 2 (two) times daily. 05/01/18   Cannady, Henrine Screws T, NP  nitroGLYCERIN (NITROSTAT) 0.4 MG SL tablet Place 1 tablet (0.4 mg total) under the tongue every 5 (five) minutes as needed for chest pain. 03/09/19   Minna Merritts, MD  oxyCODONE-acetaminophen (PERCOCET) 5-325 MG tablet Take 1 tablet by mouth every 4 (four) hours as needed for severe pain. 05/12/19 05/11/20  Blake Divine, MD  vitamin B-12 (CYANOCOBALAMIN) 1000 MCG tablet Take 1,000 mcg by mouth daily.    [provider]    Allergies Ace inhibitors and Neosporin [neomycin-bacitracin zn-polymyx]  Family History  Problem Relation Age of Onset  . Diabetes Mother   . Arrhythmia Mother 63       Pacemaker implant   . Cancer Father        lung    Social History Social History   Tobacco Use  . Smoking status: Former Smoker    Packs/day: 1.00    Quit date: 10/27/2011    Years since quitting: 7.5  . Smokeless tobacco: Never Used  Substance Use Topics  . Alcohol use: Yes    Alcohol/week: 8.0 - 14.0 standard drinks    Types: 2 - 4 Glasses of wine, 6 - 10 Shots of liquor per week  . Drug use: No    Review of Systems  Constitutional: No fever/chills Eyes: No visual changes. ENT: No sore throat.  Positive for facial pain. Cardiovascular: Denies chest pain. Respiratory: Denies shortness of breath. Gastrointestinal: No abdominal pain.  No nausea, no vomiting.  No diarrhea.  No constipation. Genitourinary: Negative for dysuria. Musculoskeletal: Negative for back pain. Skin: Negative for rash. Neurological: Positive for headaches, negative for focal weakness or numbness.  ____________________________________________   PHYSICAL EXAM:  VITAL SIGNS: ED Triage Vitals  Enc Vitals Group     BP 05/12/19 0325 (!) 155/89     Pulse Rate 05/12/19 0325 (!) 109     Resp 05/12/19 0325 16     Temp 05/12/19 0325 (!) 97.5 F (36.4 C)     Temp Source 05/12/19 0325 Oral     SpO2 05/12/19 0325 98 %     Weight 05/12/19 0322 160 lb (72.6 kg)     Height 05/12/19 0322 6' (1.829 m)     Head Circumference --      Peak Flow --      Pain Score 05/12/19 0322 6     Pain Loc --      Pain Edu? --      Excl. in [place]? --     Constitutional:  Alert and oriented. Eyes: Conjunctivae are normal. Head: Tenderness to palpation over right maxilla and zygomatic arch with palpable deformity. Nose: No congestion/rhinnorhea. Mouth/Throat: Mucous membranes are moist. Neck: Normal ROM, no midline cervical spine tenderness. Cardiovascular: Normal rate, regular rhythm. Grossly normal heart sounds. Respiratory: Normal respiratory effort.  No retractions. Lungs CTAB. Gastrointestinal: Soft and nontender. No distention. Genitourinary: deferred Musculoskeletal: No lower extremity tenderness nor edema.  Superficial abrasion over right shin with no associated bony tenderness, full range of motion in right knee and ankle without pain. Neurologic:  Normal speech  and language. No gross focal neurologic deficits are appreciated. Skin:  Skin is warm, dry and intact. No rash noted. Psychiatric: Mood and affect are normal. Speech and behavior are normal.  ____________________________________________   LABS (all labs ordered are listed, but only abnormal results are displayed)  Labs Reviewed - No data to display   PROCEDURES  Procedure(s) performed (including Critical Care):  Procedures   ____________________________________________   INITIAL IMPRESSION / ASSESSMENT AND PLAN / ED COURSE       74 year old male presents to the ED following mechanical fall a few hours prior to arrival where he struck his head and right face, but did not have any LOC.  CT imaging obtained from triage is negative for intracranial process or cervical spine injury, but does show fractures of right ZMC complex.  Will give dose of pain medication and touch base with ENT to arrange follow-up.  Case discussed with Dr. Tami Ribas of ENT, who states that the local group does not manage facial trauma and patient will need to be referred to [facility].  He advises against antibiotics.  Patient to be prescribed a short course of pain medication and I have provided him with contact  information for [facility].  He was counseled to return to the ED for new or worsening symptoms, patient agrees with plan.      ____________________________________________   FINAL CLINICAL IMPRESSION(S) / ED DIAGNOSES  Final diagnoses:  Fall, initial encounter  Closed fracture of right zygomatic arch, initial encounter [facility])     ED Discharge Orders         Ordered    oxyCODONE-acetaminophen (PERCOCET) 5-325 MG tablet  Every 4 hours PRN     05/12/19 0957           Note:  This document was prepared using Dragon voice recognition software and may include unintentional dictation errors.   Blake Divine, MD 05/12/19 1521

## 2019-05-13 DIAGNOSIS — Z01818 Encounter for other preprocedural examination: Secondary | ICD-10-CM | POA: Diagnosis not present

## 2019-05-13 DIAGNOSIS — I2510 Atherosclerotic heart disease of native coronary artery without angina pectoris: Secondary | ICD-10-CM | POA: Diagnosis not present

## 2019-05-13 DIAGNOSIS — I10 Essential (primary) hypertension: Secondary | ICD-10-CM | POA: Diagnosis not present

## 2019-05-13 DIAGNOSIS — S0240EB Zygomatic fracture, right side, initial encounter for open fracture: Secondary | ICD-10-CM | POA: Insufficient documentation

## 2019-05-13 DIAGNOSIS — E119 Type 2 diabetes mellitus without complications: Secondary | ICD-10-CM | POA: Diagnosis not present

## 2019-05-13 DIAGNOSIS — Z20822 Contact with and (suspected) exposure to covid-19: Secondary | ICD-10-CM | POA: Diagnosis not present

## 2019-05-13 DIAGNOSIS — Z01812 Encounter for preprocedural laboratory examination: Secondary | ICD-10-CM | POA: Diagnosis not present

## 2019-05-13 NOTE — Telephone Encounter (Signed)
LVM to call us back with clarification of his appointments.

## 2019-05-14 ENCOUNTER — Other Ambulatory Visit: Payer: PPO

## 2019-05-14 ENCOUNTER — Other Ambulatory Visit: Payer: Self-pay

## 2019-05-14 DIAGNOSIS — E1169 Type 2 diabetes mellitus with other specified complication: Secondary | ICD-10-CM | POA: Diagnosis not present

## 2019-05-14 DIAGNOSIS — E1159 Type 2 diabetes mellitus with other circulatory complications: Secondary | ICD-10-CM

## 2019-05-14 DIAGNOSIS — E119 Type 2 diabetes mellitus without complications: Secondary | ICD-10-CM | POA: Diagnosis not present

## 2019-05-14 DIAGNOSIS — I10 Essential (primary) hypertension: Secondary | ICD-10-CM | POA: Diagnosis not present

## 2019-05-14 DIAGNOSIS — E785 Hyperlipidemia, unspecified: Secondary | ICD-10-CM | POA: Diagnosis not present

## 2019-05-14 DIAGNOSIS — S0285XA Fracture of orbit, unspecified, initial encounter for closed fracture: Secondary | ICD-10-CM | POA: Diagnosis not present

## 2019-05-14 DIAGNOSIS — E538 Deficiency of other specified B group vitamins: Secondary | ICD-10-CM | POA: Diagnosis not present

## 2019-05-14 DIAGNOSIS — I2510 Atherosclerotic heart disease of native coronary artery without angina pectoris: Secondary | ICD-10-CM | POA: Diagnosis not present

## 2019-05-14 DIAGNOSIS — S0240EA Zygomatic fracture, right side, initial encounter for closed fracture: Secondary | ICD-10-CM | POA: Diagnosis not present

## 2019-05-14 DIAGNOSIS — S0240EB Zygomatic fracture, right side, initial encounter for open fracture: Secondary | ICD-10-CM | POA: Diagnosis not present

## 2019-05-14 LAB — LIPID PANEL PICCOLO, WAIVED
Chol/HDL Ratio Piccolo,Waive: 1.9 mg/dL
Cholesterol Piccolo, Waived: 126 mg/dL (ref <200)
HDL Chol Piccolo, Waived: 66 mg/dL (ref >59)
LDL Chol Calc Piccolo Waived: 37 mg/dL (ref <100)
Triglycerides Piccolo,Waived: 113 mg/dL (ref <150)
VLDL Chol Calc Piccolo,Waive: 23 mg/dL (ref <30)

## 2019-05-14 LAB — BAYER DCA HB A1C WAIVED: HB A1C (BAYER DCA - WAIVED): 6.3 % (ref <7.0)

## 2019-05-14 NOTE — Progress Notes (Signed)
Contacted via MyChart

## 2019-05-15 LAB — CBC WITH DIFFERENTIAL/PLATELET
Basophils Absolute: 0.1 10*3/uL (ref 0.0–0.2)
Basos: 1 %
EOS (ABSOLUTE): 0.3 10*3/uL (ref 0.0–0.4)
Eos: 4 %
Hematocrit: 41.7 % (ref 37.5–51.0)
Hemoglobin: 14.1 g/dL (ref 13.0–17.7)
Immature Grans (Abs): 0 10*3/uL (ref 0.0–0.1)
Immature Granulocytes: 0 %
Lymphocytes Absolute: 1.6 10*3/uL (ref 0.7–3.1)
Lymphs: 23 %
MCH: 32.2 pg (ref 26.6–33.0)
MCHC: 33.8 g/dL (ref 31.5–35.7)
MCV: 95 fL (ref 79–97)
Monocytes Absolute: 0.5 10*3/uL (ref 0.1–0.9)
Monocytes: 8 %
Neutrophils Absolute: 4.6 10*3/uL (ref 1.4–7.0)
Neutrophils: 64 %
Platelets: 219 10*3/uL (ref 150–450)
RBC: 4.38 x10E6/uL (ref 4.14–5.80)
RDW: 12.8 % (ref 11.6–15.4)
WBC: 7 10*3/uL (ref 3.4–10.8)

## 2019-05-15 LAB — COMPREHENSIVE METABOLIC PANEL
ALT: 11 IU/L (ref 0–44)
AST: 15 IU/L (ref 0–40)
Albumin/Globulin Ratio: 2.1 (ref 1.2–2.2)
Albumin: 4.9 g/dL — ABNORMAL HIGH (ref 3.7–4.7)
Alkaline Phosphatase: 112 IU/L (ref 39–117)
BUN/Creatinine Ratio: 14 (ref 10–24)
BUN: 15 mg/dL (ref 8–27)
Bilirubin Total: 0.7 mg/dL (ref 0.0–1.2)
CO2: 19 mmol/L — ABNORMAL LOW (ref 20–29)
Calcium: 10.1 mg/dL (ref 8.6–10.2)
Chloride: 99 mmol/L (ref 96–106)
Creatinine, Ser: 1.05 mg/dL (ref 0.76–1.27)
GFR calc Af Amer: 82 mL/min/{1.73_m2} (ref >59)
GFR calc non Af Amer: 71 mL/min/{1.73_m2} (ref >59)
Globulin, Total: 2.3 g/dL (ref 1.5–4.5)
Glucose: 150 mg/dL — ABNORMAL HIGH (ref 65–99)
Potassium: 4.3 mmol/L (ref 3.5–5.2)
Sodium: 140 mmol/L (ref 134–144)
Total Protein: 7.2 g/dL (ref 6.0–8.5)

## 2019-05-15 LAB — VITAMIN B12: Vitamin B-12: >2000 pg/mL — ABNORMAL HIGH (ref 232–1245)

## 2019-05-15 NOTE — Progress Notes (Signed)
Contacted via MyChart

## 2019-05-19 ENCOUNTER — Encounter: Payer: Self-pay | Admitting: Nurse Practitioner

## 2019-05-19 ENCOUNTER — Ambulatory Visit (INDEPENDENT_AMBULATORY_CARE_PROVIDER_SITE_OTHER): Payer: PPO | Admitting: Pharmacist

## 2019-05-19 ENCOUNTER — Ambulatory Visit (INDEPENDENT_AMBULATORY_CARE_PROVIDER_SITE_OTHER): Payer: PPO | Admitting: Nurse Practitioner

## 2019-05-19 ENCOUNTER — Other Ambulatory Visit: Payer: Self-pay

## 2019-05-19 VITALS — BP 122/72 | HR 92 | Temp 97.7°F

## 2019-05-19 DIAGNOSIS — E1169 Type 2 diabetes mellitus with other specified complication: Secondary | ICD-10-CM

## 2019-05-19 DIAGNOSIS — Z794 Long term (current) use of insulin: Secondary | ICD-10-CM | POA: Diagnosis not present

## 2019-05-19 DIAGNOSIS — E119 Type 2 diabetes mellitus without complications: Secondary | ICD-10-CM | POA: Diagnosis not present

## 2019-05-19 DIAGNOSIS — E1159 Type 2 diabetes mellitus with other circulatory complications: Secondary | ICD-10-CM | POA: Diagnosis not present

## 2019-05-19 DIAGNOSIS — I10 Essential (primary) hypertension: Secondary | ICD-10-CM | POA: Diagnosis not present

## 2019-05-19 DIAGNOSIS — E785 Hyperlipidemia, unspecified: Secondary | ICD-10-CM | POA: Diagnosis not present

## 2019-05-19 DIAGNOSIS — I25119 Atherosclerotic heart disease of native coronary artery with unspecified angina pectoris: Secondary | ICD-10-CM | POA: Diagnosis not present

## 2019-05-19 MED ORDER — EMPAGLIFLOZIN 25 MG PO TABS: 25.0000 mg | ORAL_TABLET | Freq: Every day | ORAL | 1 refills | Status: DC

## 2019-05-19 MED ORDER — OZEMPIC (0.25 OR 0.5 MG/DOSE) 2 MG/1.5ML ~~LOC~~ SOPN: 0.5000 mg | PEN_INJECTOR | SUBCUTANEOUS | 5 refills | Status: DC

## 2019-05-19 NOTE — Assessment & Plan Note (Signed)
Chronic, ongoing.  A1C today 6.3%.  No further lows with discontinuation of Lantus.  Will continue Jardiance and Metformin & Ozempic.  Continue to monitor BS at home, including post prandial check occasionally.  May need to consider return to Lantus if poor control with alternate options.  Recommend daily Miralax to help with constipation from [place].  Return to office in 3 months.

## 2019-05-19 NOTE — Assessment & Plan Note (Signed)
Chronic, ongoing.  Continue current medication regimen and adjust as needed.  Current LDL 37, tightly controlled.

## 2019-05-19 NOTE — Chronic Care Management (AMB) (Signed)
Chronic Care Management   Follow Up Note   05/19/2019 Name: Grainger Mccarley MRN: [id number] DOB: 1945-10-08  Referred by: Venita Lick, NP Reason for referral : Chronic Care Management (Medication Management)   Zaydan Papesh is a 74 y.o. year old male who is a primary care patient of Cannady, Barbaraann Faster, NP. The CCM team was consulted for assistance with chronic disease management and care coordination needs.    Met with patient face to face prior to appointment w/ provider.   Review of patient status, including review of consultants reports, relevant laboratory and other test results, and collaboration with appropriate care team members and the patient's provider was performed as part of comprehensive patient evaluation and provision of chronic care management services.    SDOH (Social Determinants of Health) screening performed today: Stress. See Care Plan for related entries.   Outpatient Encounter Medications as of 05/19/2019  Medication Sig  . aspirin EC 81 MG tablet Take 81 mg by mouth daily.  Marland Kitchen atorvastatin (LIPITOR) 40 MG tablet TAKE 1 TABLET BY MOUTH  DAILY AT 6 PM  . glucose blood (ONE TOUCH ULTRA TEST) test strip Check blood 2-3 times a day  . Lancet Device MISC 1 Units by Does not apply route daily as needed.  . metFORMIN (GLUCOPHAGE) 1000 MG tablet TAKE 1 TABLET BY MOUTH TWO  TIMES DAILY WITH A MEAL  . metoprolol tartrate (LOPRESSOR) 25 MG tablet Take 0.5 tablets (12.5 mg total) by mouth 2 (two) times daily.  . nitroGLYCERIN (NITROSTAT) 0.4 MG SL tablet Place 1 tablet (0.4 mg total) under the tongue every 5 (five) minutes as needed for chest pain.  . vitamin B-12 (CYANOCOBALAMIN) 1000 MCG tablet Take 500 mcg by mouth daily.   . [DISCONTINUED] empagliflozin (JARDIANCE) 25 MG TABS tablet Take 25 mg by mouth daily before breakfast.  . [DISCONTINUED] OZEMPIC, 0.25 OR 0.5 MG/DOSE, 2 MG/1.5ML SOPN Inject 0.5 mg into the skin once a week.   No facility-administered encounter  medications on file as of 05/19/2019.     Objective:   Goals Addressed            This Visit's Progress     Patient Stated   . PharmD "I want to stay healthy" (pt-stated)       Current Barriers:  . Diabetes: controlled; most recent A1c 6.3%  o Notes lack of appetite since starting Jardiance. Denies GI upset since starting Ozempic . [place], broke R zygomatic arch; had repair surgery last Thursday. Notes continued numbness in his face. . Current antihyperglycemic regimen: metformin 1000 mg BID, Jardiance 25 mg daily, Ozempic 0.5 mg (just increased) o Notes hx Glyxambi (metformin/sitagliptin), pioglitazone (d/c d/t cardiac disease hx) . Denies hypoglycemia since stopping insulin . Current blood glucose readings:   o Graphing shows fasting average 140 since starting Jardiance, though has just increased Ozempic to 0.5 mg  . Cardiovascular risk reduction (hx CAD s/p stent to RCA in 07/2017) o Current hypertensive regimen: metoprolol tartrate 12.5 mg BID (hx intolerance to ACEI d/t hyperkalemia); BP at goal <130/80 today in office - Visit w/ Dr Rockey Situ, discussed potential for [facility] in the future  o Current hyperlipidemia regimen: atorvastatin 40 mg daily; lipids pending today o Antiplatelet regimen: ASA 81 mg (completed 1 year of DAPT w/ clopidogrel)  Pharmacist Clinical Goal(s):  Marland Kitchen Over the next 90 days, patient will work with PharmD and primary care provider to address optimized medication management  Interventions: . Comprehensive medication review performed, medication list updated in  electronic medical record . Reviewed mechanism of action of GLP1 and SGLT2. Encouraged to let us know if dose increase of Ozempic causes significant GI upset, or negative impact on appetite.  . Reviewed goal A1c, goal fasting, and goal 2 hour post prandial glucose readings.  . Discussed changes in potassium and potential reasons for rapid changes.  Patient Self Care Activities:  . Patient will check  blood glucose daily , document, and provide at future appointments . Patient will take medications as prescribed . Patient will report any questions or concerns to provider   Please see past updates related to this goal by clicking on the "Past Updates" button in the selected goal          Plan: - Scheduled f/u call 07/07/19  Catie Darnelle Maffucci, PharmD, [place] [phone]

## 2019-05-19 NOTE — Progress Notes (Signed)
BP 122/72 (BP Location: Left Arm, Patient Position: Sitting, Cuff Size: Normal)   Pulse 92   Temp 97.7 F (36.5 C) (Oral)   SpO2 96%    Subjective:    Patient ID: Johnny Wolfe, male    DOB: 1945-10-26, 74 y.o.   MRN: [id number]  HPI: Johnny Wolfe is a 74 y.o. male  Chief Complaint  Patient presents with  . Follow-up    2 month DM   DIABETES Jardiance was increased on 03/18/19 to 25 MG, continues on Metformin 1000 MG BID and Ozempic 0.5 MG weekly (started in December).  Was discontinued off Lantus in October due to low BS readings.  Does endorse some constipation with Ozempic. Hypoglycemic episodes:no Polydipsia/polyuria: no Visual disturbance: no Chest pain: no Paresthesias: no Glucose Monitoring: yes             Accucheck frequency: Daily             Fasting glucose: 112 to 188             Post prandial: 108 to 309 (outlier high due to high carb meal)             Evening:             Before meals: Taking Insulin?: no             Long acting insulin:             Short acting insulin: Blood Pressure Monitoring: daily Retinal Examination: Up to Date Foot Exam: Up to Date Pneumovax: Up to Date Influenza: Up to Date Aspirin: yes  HYPERTENSION / HYPERLIPIDEMIA Continues on ASA and Metoprolol + Lipitor. Satisfied with current treatment? yes Duration of hypertension: chronic BP monitoring frequency: a few times a week BP range: 120/70 range at home BP medication side effects: no Duration of hyperlipidemia: chronic Cholesterol medication side effects: no Cholesterol supplements: none Medication compliance: good compliance Aspirin: yes Recent stressors: no Recurrent headaches: no Visual changes: no Palpitations: no Dyspnea: no Chest pain: no Lower extremity edema: no Dizzy/lightheaded: no  Relevant past medical, surgical, family and social history reviewed and updated as indicated. Interim medical history since our last visit reviewed. Allergies and medications  reviewed and updated.  Review of Systems  Constitutional: Negative.   Respiratory: Negative for cough, chest tightness, shortness of breath and wheezing.   Cardiovascular: Negative for chest pain, palpitations and leg swelling.  Gastrointestinal: Negative.   Endocrine: Negative for cold intolerance, heat intolerance, polydipsia, polyphagia and polyuria.  Neurological: Negative.   Psychiatric/Behavioral: Negative.     Per HPI unless specifically indicated above     Objective:    BP 122/72 (BP Location: Left Arm, Patient Position: Sitting, Cuff Size: Normal)   Pulse 92   Temp 97.7 F (36.5 C) (Oral)   SpO2 96%   Wt Readings from Last 3 Encounters:  05/12/19 160 lb (72.6 kg)  03/18/19 167 lb (75.8 kg)  03/09/19 170 lb 8 oz (77.3 kg)    Physical Exam Vitals and nursing note reviewed.  Constitutional:      General: He is awake. He is not in acute distress.    Appearance: He is well-developed. He is not ill-appearing.  HENT:     Head: Normocephalic and atraumatic.     Right Ear: Hearing normal. No drainage.     Left Ear: Hearing normal. No drainage.  Eyes:     General: Lids are normal.        Right eye: No  discharge.        Left eye: No discharge.     Conjunctiva/sclera: Conjunctivae normal.     Pupils: Pupils are equal, round, and reactive to light.  Neck:     Vascular: No carotid bruit.  Cardiovascular:     Rate and Rhythm: Normal rate and regular rhythm.     Heart sounds: Normal heart sounds, S1 normal and S2 normal. No murmur. No gallop.   Pulmonary:     Effort: Pulmonary effort is normal. No accessory muscle usage or respiratory distress.     Breath sounds: Normal breath sounds.  Abdominal:     General: Bowel sounds are normal.     Palpations: Abdomen is soft.  Musculoskeletal:        General: Normal range of motion.     Cervical back: Normal range of motion and neck supple.     Right lower leg: No edema.     Left lower leg: No edema.  Skin:    General: Skin  is warm and dry.  Neurological:     Mental Status: He is alert and oriented to person, place, and time.  Psychiatric:        Mood and Affect: Mood normal.        Behavior: Behavior normal. Behavior is cooperative.        Thought Content: Thought content normal.        Judgment: Judgment normal.     Results for orders placed or performed in visit on 05/14/19  Vitamin B12  Result Value Ref Range   Vitamin B-12 >2000 (H) 232 - 1245 pg/mL  CBC with Differential/Platelet out  Result Value Ref Range   WBC 7.0 3.4 - 10.8 x10E3/uL   RBC 4.38 4.14 - 5.80 x10E6/uL   Hemoglobin 14.1 13.0 - 17.7 g/dL   Hematocrit 41.7 37.5 - 51.0 %   MCV 95 79 - 97 fL   MCH 32.2 26.6 - 33.0 pg   MCHC 33.8 31.5 - 35.7 g/dL   RDW 12.8 11.6 - 15.4 %   Platelets 219 150 - 450 x10E3/uL   Neutrophils 64 Not Estab. %   Lymphs 23 Not Estab. %   Monocytes 8 Not Estab. %   Eos 4 Not Estab. %   Basos 1 Not Estab. %   Neutrophils Absolute 4.6 1.4 - 7.0 x10E3/uL   Lymphocytes Absolute 1.6 0.7 - 3.1 x10E3/uL   Monocytes Absolute 0.5 0.1 - 0.9 x10E3/uL   EOS (ABSOLUTE) 0.3 0.0 - 0.4 x10E3/uL   Basophils Absolute 0.1 0.0 - 0.2 x10E3/uL   Immature Granulocytes 0 Not Estab. %   Immature Grans (Abs) 0.0 0.0 - 0.1 x10E3/uL  39m Lipid Panel  Result Value Ref Range   Cholesterol Piccolo, Waived 126 <200 mg/dL   HDL Chol Piccolo, Waived 66 >59 mg/dL   Triglycerides Piccolo,Waived 113 <150 mg/dL   Chol/HDL Ratio Piccolo,Waive 1.9 mg/dL   LDL Chol Calc Piccolo Waived 37 <100 mg/dL   VLDL Chol Calc Piccolo,Waive 23 <30 mg/dL  Comprehensive metabolic panel  Result Value Ref Range   Glucose 150 (H) 65 - 99 mg/dL   BUN 15 8 - 27 mg/dL   Creatinine, Ser 1.05 0.76 - 1.27 mg/dL   GFR calc non Af Amer 71 >59 mL/min/1.73   GFR calc Af Amer 82 >59 mL/min/1.73   BUN/Creatinine Ratio 14 10 - 24   Sodium 140 134 - 144 mmol/L   Potassium 4.3 3.5 - 5.2 mmol/L   Chloride 99 96 -  106 mmol/L   CO2 19 (L) 20 - 29 mmol/L   Calcium  10.1 8.6 - 10.2 mg/dL   Total Protein 7.2 6.0 - 8.5 g/dL   Albumin 4.9 (H) 3.7 - 4.7 g/dL   Globulin, Total 2.3 1.5 - 4.5 g/dL   Albumin/Globulin Ratio 2.1 1.2 - 2.2   Bilirubin Total 0.7 0.0 - 1.2 mg/dL   Alkaline Phosphatase 112 39 - 117 IU/L   AST 15 0 - 40 IU/L   ALT 11 0 - 44 IU/L  Bayer DCA Hb A1c Waived  Result Value Ref Range   HB A1C (BAYER DCA - WAIVED) 6.3 <7.0 %      Assessment & Plan:   Problem List Items Addressed This Visit      Cardiovascular and Mediastinum   Hypertension associated with diabetes (HCC)    Chronic, ongoing.  BP at goal today and on rare home readings.  Continue current medication regimen and collaboration with cardiology.  To schedule follow-up with Dr. Rockey Situ.  Return in 3 months.        Relevant Medications   empagliflozin (JARDIANCE) 25 MG TABS tablet   OZEMPIC, 0.25 OR 0.5 MG/DOSE, 2 MG/1.5ML SOPN     Endocrine   Type 2 diabetes mellitus without complication (HCC) - Primary    Chronic, ongoing.  A1C today 6.3%.  No further lows with discontinuation of Lantus.  Will continue Jardiance and Metformin & Ozempic.  Continue to monitor BS at home, including post prandial check occasionally.  May need to consider return to Lantus if poor control with alternate options.  Recommend daily Miralax to help with constipation from [place].  Return to office in 3 months.      Relevant Medications   empagliflozin (JARDIANCE) 25 MG TABS tablet   OZEMPIC, 0.25 OR 0.5 MG/DOSE, 2 MG/1.5ML SOPN   Hyperlipidemia associated with type 2 diabetes mellitus (HCC)    Chronic, ongoing.  Continue current medication regimen and adjust as needed.  Current LDL 37, tightly controlled.      Relevant Medications   empagliflozin (JARDIANCE) 25 MG TABS tablet   OZEMPIC, 0.25 OR 0.5 MG/DOSE, 2 MG/1.5ML SOPN       Follow up plan: Return in about 3 months (around 08/17/2019) for T2DM, HTN/HLD.

## 2019-05-19 NOTE — Assessment & Plan Note (Signed)
Chronic, ongoing.  BP at goal today and on rare home readings.  Continue current medication regimen and collaboration with cardiology.  To schedule follow-up with Dr. Rockey Situ.  Return in 3 months.

## 2019-05-19 NOTE — Patient Instructions (Signed)
Visit Information  Goals Addressed            This Visit's Progress     Patient Stated   . PharmD "I want to stay healthy" (pt-stated)       Current Barriers:  . Diabetes: controlled; most recent A1c 6.3%  o Notes lack of appetite since starting Jardiance. Denies GI upset since starting Ozempic . [place], broke R zygomatic arch; had repair surgery last Thursday. Notes continued numbness in his face. . Current antihyperglycemic regimen: metformin 1000 mg BID, Jardiance 25 mg daily, Ozempic 0.5 mg (just increased) o Notes hx Glyxambi (metformin/sitagliptin), pioglitazone (d/c d/t cardiac disease hx) . Denies hypoglycemia since stopping insulin . Current blood glucose readings:   o Graphing shows fasting average 140 since starting Jardiance, though has just increased Ozempic to 0.5 mg  . Cardiovascular risk reduction (hx CAD s/p stent to RCA in 07/2017) o Current hypertensive regimen: metoprolol tartrate 12.5 mg BID (hx intolerance to ACEI d/t hyperkalemia); BP at goal <130/80 today in office - Visit w/ Dr Rockey Situ, discussed potential for [facility] in the future  o Current hyperlipidemia regimen: atorvastatin 40 mg daily; lipids pending today o Antiplatelet regimen: ASA 81 mg (completed 1 year of DAPT w/ clopidogrel)  Pharmacist Clinical Goal(s):  Marland Kitchen Over the next 90 days, patient will work with PharmD and primary care provider to address optimized medication management  Interventions: . Comprehensive medication review performed, medication list updated in electronic medical record . Reviewed mechanism of action of GLP1 and SGLT2. Encouraged to let us know if dose increase of Ozempic causes significant GI upset, or negative impact on appetite.  . Reviewed goal A1c, goal fasting, and goal 2 hour post prandial glucose readings.  . Discussed changes in potassium and potential reasons for rapid changes.  Patient Self Care Activities:  . Patient will check blood glucose daily , document, and  provide at future appointments . Patient will take medications as prescribed . Patient will report any questions or concerns to provider   Please see past updates related to this goal by clicking on the "Past Updates" button in the selected goal         The patient verbalized understanding of instructions provided today and declined a print copy of patient instruction materials.   Plan: - Scheduled f/u call 07/07/19  Catie Darnelle Maffucci, PharmD, [place] [phone]

## 2019-05-19 NOTE — Patient Instructions (Signed)
Carbohydrate Counting for Diabetes Mellitus, Adult  Carbohydrate counting is a method of keeping track of how many carbohydrates you eat. Eating carbohydrates naturally increases the amount of sugar (glucose) in the blood. Counting how many carbohydrates you eat helps keep your blood glucose within normal limits, which helps you manage your diabetes (diabetes mellitus). It is important to know how many carbohydrates you can safely have in each meal. This is different for every person. A diet and nutrition specialist (registered dietitian) can help you make a meal plan and calculate how many carbohydrates you should have at each meal and snack. Carbohydrates are found in the following foods:  Grains, such as breads and cereals.  Dried beans and soy products.  Starchy vegetables, such as potatoes, peas, and corn.  Fruit and fruit juices.  Milk and yogurt.  Sweets and snack foods, such as cake, cookies, candy, chips, and soft drinks. How do I count carbohydrates? There are two ways to count carbohydrates in food. You can use either of the methods or a combination of both. Reading "Nutrition Facts" on packaged food The "Nutrition Facts" list is included on the labels of almost all packaged foods and beverages in the [place] It includes:  The serving size.  Information about nutrients in each serving, including the grams (g) of carbohydrate per serving. To use the "Nutrition Facts":  Decide how many servings you will have.  Multiply the number of servings by the number of carbohydrates per serving.  The resulting number is the total amount of carbohydrates that you will be having. Learning standard serving sizes of other foods When you eat carbohydrate foods that are not packaged or do not include "Nutrition Facts" on the label, you need to measure the servings in order to count the amount of carbohydrates:  Measure the foods that you will eat with a food scale or measuring cup, if  needed.  Decide how many standard-size servings you will eat.  Multiply the number of servings by 15. Most carbohydrate-rich foods have about 15 g of carbohydrates per serving. ? For example, if you eat 8 oz (170 g) of strawberries, you will have eaten 2 servings and 30 g of carbohydrates (2 servings x 15 g = 30 g).  For foods that have more than one food mixed, such as soups and casseroles, you must count the carbohydrates in each food that is included. The following list contains standard serving sizes of common carbohydrate-rich foods. Each of these servings has about 15 g of carbohydrates:   hamburger bun or  English muffin.   oz (15 mL) syrup.   oz (14 g) jelly.  1 slice of bread.  1 six-inch tortilla.  3 oz (85 g) cooked rice or pasta.  4 oz (113 g) cooked dried beans.  4 oz (113 g) starchy vegetable, such as peas, corn, or potatoes.  4 oz (113 g) hot cereal.  4 oz (113 g) mashed potatoes or  of a large baked potato.  4 oz (113 g) canned or frozen fruit.  4 oz (120 mL) fruit juice.  4-6 crackers.  6 chicken nuggets.  6 oz (170 g) unsweetened dry cereal.  6 oz (170 g) plain fat-free yogurt or yogurt sweetened with artificial sweeteners.  8 oz (240 mL) milk.  8 oz (170 g) fresh fruit or one small piece of fruit.  24 oz (680 g) popped popcorn. Example of carbohydrate counting Sample meal  3 oz (85 g) chicken breast.  6 oz (170 g)   brown rice.  4 oz (113 g) corn.  8 oz (240 mL) milk.  8 oz (170 g) strawberries with sugar-free whipped topping. Carbohydrate calculation 1. Identify the foods that contain carbohydrates: ? Rice. ? Corn. ? Milk. ? Strawberries. 2. Calculate how many servings you have of each food: ? 2 servings rice. ? 1 serving corn. ? 1 serving milk. ? 1 serving strawberries. 3. Multiply each number of servings by 15 g: ? 2 servings rice x 15 g = 30 g. ? 1 serving corn x 15 g = 15 g. ? 1 serving milk x 15 g = 15 g. ? 1  serving strawberries x 15 g = 15 g. 4. Add together all of the amounts to find the total grams of carbohydrates eaten: ? 30 g + 15 g + 15 g + 15 g = 75 g of carbohydrates total. Summary  Carbohydrate counting is a method of keeping track of how many carbohydrates you eat.  Eating carbohydrates naturally increases the amount of sugar (glucose) in the blood.  Counting how many carbohydrates you eat helps keep your blood glucose within normal limits, which helps you manage your diabetes.  A diet and nutrition specialist (registered dietitian) can help you make a meal plan and calculate how many carbohydrates you should have at each meal and snack. This information is not intended to replace advice given to you by your health care provider. Make sure you discuss any questions you have with your health care provider. Document Revised: 11/08/2016 Document Reviewed: 09/28/2015 [employer] Patient Education  2020 [employer]  

## 2019-06-01 HISTORY — PX: OTHER SURGICAL HISTORY: SHX169

## 2019-06-09 ENCOUNTER — Other Ambulatory Visit: Payer: Self-pay | Admitting: Nurse Practitioner

## 2019-06-18 ENCOUNTER — Emergency Department: Payer: PPO

## 2019-06-18 ENCOUNTER — Emergency Department
Admission: EM | Admit: 2019-06-18 | Discharge: 2019-06-19 | Disposition: A | Discharge status: Home / Self Care | Payer: PPO | Attending: Emergency Medicine | Admitting: Emergency Medicine

## 2019-06-18 ENCOUNTER — Encounter: Payer: Self-pay | Admitting: Emergency Medicine

## 2019-06-18 DIAGNOSIS — S0990XA Unspecified injury of head, initial encounter: Secondary | ICD-10-CM | POA: Diagnosis not present

## 2019-06-18 DIAGNOSIS — Y998 Other external cause status: Secondary | ICD-10-CM | POA: Diagnosis not present

## 2019-06-18 DIAGNOSIS — I10 Essential (primary) hypertension: Secondary | ICD-10-CM | POA: Insufficient documentation

## 2019-06-18 DIAGNOSIS — S199XXA Unspecified injury of neck, initial encounter: Secondary | ICD-10-CM | POA: Diagnosis not present

## 2019-06-18 DIAGNOSIS — H748X1 Other specified disorders of right middle ear and mastoid: Secondary | ICD-10-CM

## 2019-06-18 DIAGNOSIS — E119 Type 2 diabetes mellitus without complications: Secondary | ICD-10-CM | POA: Insufficient documentation

## 2019-06-18 DIAGNOSIS — Z7984 Long term (current) use of oral hypoglycemic drugs: Secondary | ICD-10-CM | POA: Diagnosis not present

## 2019-06-18 DIAGNOSIS — R0789 Other chest pain: Secondary | ICD-10-CM

## 2019-06-18 DIAGNOSIS — S99911A Unspecified injury of right ankle, initial encounter: Secondary | ICD-10-CM | POA: Diagnosis present

## 2019-06-18 DIAGNOSIS — Y9389 Activity, other specified: Secondary | ICD-10-CM | POA: Insufficient documentation

## 2019-06-18 DIAGNOSIS — Z87891 Personal history of nicotine dependence: Secondary | ICD-10-CM | POA: Insufficient documentation

## 2019-06-18 DIAGNOSIS — T148XXA Other injury of unspecified body region, initial encounter: Secondary | ICD-10-CM | POA: Diagnosis not present

## 2019-06-18 DIAGNOSIS — S299XXA Unspecified injury of thorax, initial encounter: Secondary | ICD-10-CM | POA: Diagnosis not present

## 2019-06-18 DIAGNOSIS — S51812A Laceration without foreign body of left forearm, initial encounter: Secondary | ICD-10-CM | POA: Diagnosis not present

## 2019-06-18 DIAGNOSIS — S02651A Fracture of angle of right mandible, initial encounter for closed fracture: Secondary | ICD-10-CM

## 2019-06-18 DIAGNOSIS — Y92018 Other place in single-family (private) house as the place of occurrence of the external cause: Secondary | ICD-10-CM | POA: Diagnosis not present

## 2019-06-18 DIAGNOSIS — S0181XA Laceration without foreign body of other part of head, initial encounter: Secondary | ICD-10-CM

## 2019-06-18 DIAGNOSIS — R42 Dizziness and giddiness: Secondary | ICD-10-CM | POA: Insufficient documentation

## 2019-06-18 DIAGNOSIS — I2510 Atherosclerotic heart disease of native coronary artery without angina pectoris: Secondary | ICD-10-CM | POA: Diagnosis not present

## 2019-06-18 DIAGNOSIS — Z7982 Long term (current) use of aspirin: Secondary | ICD-10-CM | POA: Insufficient documentation

## 2019-06-18 DIAGNOSIS — R55 Syncope and collapse: Secondary | ICD-10-CM

## 2019-06-18 DIAGNOSIS — Z79899 Other long term (current) drug therapy: Secondary | ICD-10-CM | POA: Insufficient documentation

## 2019-06-18 DIAGNOSIS — W19XXXA Unspecified fall, initial encounter: Secondary | ICD-10-CM

## 2019-06-18 DIAGNOSIS — Z955 Presence of coronary angioplasty implant and graft: Secondary | ICD-10-CM | POA: Diagnosis not present

## 2019-06-18 DIAGNOSIS — S02611A Fracture of condylar process of right mandible, initial encounter for closed fracture: Secondary | ICD-10-CM | POA: Diagnosis not present

## 2019-06-18 LAB — CBC
HCT: 41.7 % (ref 39.0–52.0)
Hemoglobin: 13.4 g/dL (ref 13.0–17.0)
MCH: 31.6 pg (ref 26.0–34.0)
MCHC: 32.1 g/dL (ref 30.0–36.0)
MCV: 98.3 fL (ref 80.0–100.0)
Platelets: 237 10*3/uL (ref 150–400)
RBC: 4.24 MIL/uL (ref 4.22–5.81)
RDW: 14 % (ref 11.5–15.5)
WBC: 10.2 10*3/uL (ref 4.0–10.5)
nRBC: 0 % (ref 0.0–0.2)

## 2019-06-18 LAB — BASIC METABOLIC PANEL
Anion gap: 17 — ABNORMAL HIGH (ref 5–15)
BUN: 13 mg/dL (ref 8–23)
CO2: 21 mmol/L — ABNORMAL LOW (ref 22–32)
Calcium: 9.7 mg/dL (ref 8.9–10.3)
Chloride: 102 mmol/L (ref 98–111)
Creatinine, Ser: 0.76 mg/dL (ref 0.61–1.24)
GFR calc Af Amer: >60 mL/min (ref >60)
GFR calc non Af Amer: >60 mL/min (ref >60)
Glucose, Bld: 73 mg/dL (ref 70–99)
Potassium: 4.3 mmol/L (ref 3.5–5.1)
Sodium: 140 mmol/L (ref 135–145)

## 2019-06-18 MED ORDER — SODIUM CHLORIDE 0.9 % IV SOLN
3.0000 g | INTRAVENOUS | Status: AC
  Administered 2019-06-19: 3 g via INTRAVENOUS
  Filled 2019-06-18: qty 8

## 2019-06-18 NOTE — ED Provider Notes (Addendum)
[facility] Emergency Department Provider Note  ____________________________________________   First MD Initiated Contact with Patient 06/18/19 2301     (approximate)  I have reviewed the triage vital signs and the nursing notes.   HISTORY  Chief Complaint Fall    HPI Johnny Wolfe is a 74 y.o. male with medical history as listed below which notably includes a couple of episodes of syncope, coronary artery disease, and a relatively recent facial fracture requiring ORIF by Dr. Mardee Postin with ENT at [facility].  He presents tonight by private vehicle after a syncopal episode at home which resulted in an injury to his chin and jaw.  He said that he got up tonight and felt dizzy and is not quite certain what happened but woke up on the floor with an odd and "full" feeling in his right ear and some bleeding under his chin.  He also has an obvious deformity to his jaw with pain in the right side of his jaw near his ear. He also reports some point tenderness in the left anterior chest wall.  No SOB, no abdominal pain.  Denies N/V.  Some skin tears to left arm and minor pain to right wrist.  No AMS, alert and oriented and able to give a good history other than the brief episode when he syncopized.  Episode was acute in onset, pain is moderate (in upper jaw), moving face mkes it worse. Denies headache, neck pain, back pain.        Past Medical History:  Diagnosis Date  . CAD (coronary artery disease)    a. 06/2017 Cardiac CT: Ca2+ score of 2310 - 95th%'ile;  b. 07/2017 Cath/PCI: LM nl, LAD 60p, 66m, LCX 60p, OM2 40p, RCA dominant, 80p (3.5x15 [place] DES), 1m, 30d, EF 55-65%.  . Cataract   . Diabetes mellitus without complication ([place])   . Essential hypertension   . Hyperlipidemia   . Syncope   . Wears dentures    full upper and lower    Patient Active Problem List   Diagnosis Date Noted  . Atopic dermatitis 05/01/2018  . Keratosis 11/26/2017  . Advanced care  planning/counseling discussion 11/06/2017  . Coronary artery disease involving native coronary artery with angina pectoris ([place]) 08/02/2017  . Abnormal screening cardiac CT 08/02/2017  . Ear cysts 06/10/2017  . Type 2 diabetes mellitus without complication ([place]) [id number]  . Hypertension associated with diabetes ([place]) 10/19/2016  . Hyperlipidemia associated with type 2 diabetes mellitus ([place]) 10/19/2016    Past Surgical History:  Procedure Laterality Date  . APPENDECTOMY    . CATARACT EXTRACTION, BILATERAL     August 2017  . CORONARY STENT INTERVENTION N/A 08/02/2017   Procedure: CORONARY STENT INTERVENTION;  Surgeon: Wellington Hampshire, MD;  Location: [facility];  Service: Cardiovascular;  Laterality: N/A;  . LEFT HEART CATH AND CORONARY ANGIOGRAPHY Left 08/02/2017   Procedure: LEFT HEART CATH AND CORONARY ANGIOGRAPHY;  Surgeon: Minna Merritts, MD;  Location: [facility];  Service: Cardiovascular;  Laterality: Left;  Marland Kitchen MASS EXCISION Left 07/04/2017   Procedure: REMOVAL TWO CYST EAR EAR LOBE AND EXTERNAL EAR;  Surgeon: Beverly Gust, MD;  Location: [facility];  Service: ENT;  Laterality: Left;  LOCAL Diabetic - insulin and oral meds    Prior to Admission medications   Medication Sig Start Date End Date Taking? Authorizing Provider  ascorbic acid (VITAMIN C) 500 MG tablet Take 500 mg by mouth daily.   Yes [provider]  aspirin EC 81 MG tablet Take 81 mg by mouth daily.   Yes [provider]  atorvastatin (LIPITOR) 40 MG tablet TAKE 1 TABLET BY MOUTH  DAILY AT 6 PM 02/19/19  Yes Cannady, Jolene T, NP  chlorhexidine (PERIDEX) 0.12 % solution 15 mLs 2 (two) times daily. 06/11/19  Yes [provider]  empagliflozin (JARDIANCE) 25 MG TABS tablet Take 25 mg by mouth daily before breakfast. 05/19/19  Yes Cannady, Jolene T, NP  glucose blood (ONE TOUCH ULTRA TEST) test strip Check blood 2-3 times a day 02/18/19  Yes Cannady, Jolene T, NP    Lancet Device MISC 1 Units by Does not apply route daily as needed. 03/18/19  Yes Volney American, PA-C  metFORMIN (GLUCOPHAGE) 1000 MG tablet TAKE 1 TABLET BY MOUTH TWO  TIMES DAILY WITH A MEAL 02/19/19  Yes Cannady, Jolene T, NP  metoprolol tartrate (LOPRESSOR) 25 MG tablet TAKE ONE-HALF TABLET BY  MOUTH TWO TIMES DAILY Patient taking differently: 12.5 mg 2 (two) times daily.  06/09/19  Yes Cannady, Jolene T, NP  nitroGLYCERIN (NITROSTAT) 0.4 MG SL tablet Place 1 tablet (0.4 mg total) under the tongue every 5 (five) minutes as needed for chest pain. 03/09/19  Yes Gollan, Kathlene November, MD  OZEMPIC, 0.25 OR 0.5 MG/DOSE, 2 MG/1.5ML SOPN Inject 0.5 mg into the skin once a week. 05/19/19  Yes Cannady, Jolene T, NP  vitamin B-12 (CYANOCOBALAMIN) 1000 MCG tablet Take 500 mcg by mouth daily.    Yes [provider]    Allergies Ace inhibitors and Neosporin [neomycin-bacitracin zn-polymyx]  Family History  Problem Relation Age of Onset  . Diabetes Mother   . Arrhythmia Mother 90       Pacemaker implant   . Cancer Father        lung    Social History Social History   Tobacco Use  . Smoking status: Former Smoker    Packs/day: 1.00    Quit date: 10/27/2011    Years since quitting: 7.6  . Smokeless tobacco: Never Used  Substance Use Topics  . Alcohol use: Yes    Alcohol/week: 8.0 - 14.0 standard drinks    Types: 2 - 4 Glasses of wine, 6 - 10 Shots of liquor per week  . Drug use: No    Review of Systems Constitutional: No fever/chills Eyes: No visual changes. ENT: Difficulty opening mouth.  Sensation of fullness in the right ear with some bleeding but no significant pain.   Cardiovascular: Point tenderness to the anterior chest wall after the fall. Respiratory: Denies shortness of breath. Gastrointestinal: No abdominal pain.  No nausea, no vomiting.  No diarrhea.  No constipation. Genitourinary: Negative for dysuria. Musculoskeletal: Right upper jaw pain and deformity.  No  neck/back pain.  Minor right wrist pain.  Integumentary: 2 skin tears on the left forearm.  Laceration underneath chin. Neurological: Negative for headaches, focal weakness or numbness.   ____________________________________________   PHYSICAL EXAM:  VITAL SIGNS: ED Triage Vitals [06/18/19 2049]  Enc Vitals Group     BP 138/72     Pulse Rate (!) 115     Resp 17     Temp      Temp Source Oral     SpO2 97 %     Weight      Height      Head Circumference      Peak Flow      Pain Score      Pain Loc  Pain Edu?      Excl. in [place]?     Constitutional: Alert and oriented.  Eyes: Conjunctivae are normal.  Head: No battle sign nor raccoon eyes.  No obvious trauma to the patient's head but he has an obviously injured and displaced jaw. Ears: Normal left ear.  Hemotympanum from the right ear without an obvious source.  The visible part of the tympanic membrane is intact but I cannot visualize the inferior aspect of the membrane. Nose: No congestion/rhinnorhea.  No epistaxis. Mouth/Throat: Displaced mandible consistent with mandibular fracture seen on CT scan.  No blood visible inside the mouth.  Patient wears upper and lower dentures.  +Trismus.  Neck: No stridor.  No meningeal signs.  No tenderness to palpation of the cervical spine and the patient is able to flex and extend his head as well as rotate side to side without any reproducible pain. Cardiovascular: Normal rate, regular rhythm. Good peripheral circulation. Grossly normal heart sounds. Respiratory: Normal respiratory effort.  No retractions. Gastrointestinal: Soft and nontender. No distention.  Musculoskeletal: No gross bony deformities of any of his extremities.  He has some tenderness to palpation of the right wrist but no swelling and is able to flex and extend his wrist fully and without difficulty. Neurologic:  Normal speech and language. No gross focal neurologic deficits are appreciated.  Skin: 1 cm long laceration  to the bottom of his chin, some minor oozing of blood.  2 relatively superficial skin tears to his left forearm. Psychiatric: Mood and affect are normal. Speech and behavior are normal.  ____________________________________________   LABS (all labs ordered are listed, but only abnormal results are displayed)  Labs Reviewed  BASIC METABOLIC PANEL - Abnormal; Notable for the following components:      Result Value   CO2 21 (*)    Anion gap 17 (*)    All other components within normal limits  CBC  URINALYSIS, COMPLETE (UACMP) WITH MICROSCOPIC  CBG MONITORING, ED   ____________________________________________  EKG  ED ECG REPORT I, Hinda Kehr, the attending physician, personally viewed and interpreted this ECG.  Date: 06/18/2019 EKG Time: 20: 50 Rate: 113 Rhythm: Sinus tachycardia QRS Axis: Rightward axis deviation Intervals: normal ST/T Wave abnormalities: Non-specific ST segment / T-wave changes, but no clear evidence of acute ischemia. Narrative Interpretation: no definitive evidence of acute ischemia; does not meet STEMI criteria.   ____________________________________________  RADIOLOGY I, Hinda Kehr, personally viewed and evaluated these images (plain radiographs) as part of my medical decision making, as well as reviewing the written report by the radiologist.  ED MD interpretation: No acute abnormalities on chest x-ray.  No intracranial bleeding on head CT and cervical spine shows no acute injuries.  However the maxillofacial CT demonstrates comminuted fracture of the right mandibular condyle involving the condylar head with inferior and medial displacement at the TMJ.  Official radiology report(s): DG Chest 2 View  Result Date: 06/18/2019 CLINICAL DATA:  Pain status post fall EXAM: CHEST - 2 VIEW COMPARISON:  CT chest dated 04/14/2018. FINDINGS: The heart size and mediastinal contours are within normal limits. Both lungs are clear. The visualized skeletal  structures are unremarkable. There is a pleural based pulmonary nodule in the left mid lung zone, grossly stable from prior CT chest given differences in technique. There are additional bilateral calcified pulmonary nodules as seen on prior study. There is no pneumothorax. There is no acute osseous abnormality. IMPRESSION: No active cardiopulmonary disease. Electronically Signed   By: Constance Holster  M.D.   On: 06/18/2019 21:10   CT Head Wo Contrast  Result Date: 06/18/2019 CLINICAL DATA:  Fall EXAM: CT HEAD WITHOUT CONTRAST CT MAXILLOFACIAL WITHOUT CONTRAST CT CERVICAL SPINE WITHOUT CONTRAST TECHNIQUE: Multidetector CT imaging of the head, cervical spine, and maxillofacial structures were performed using the standard protocol without intravenous contrast. Multiplanar CT image reconstructions of the cervical spine and maxillofacial structures were also generated. COMPARISON:  05/12/2019 FINDINGS: CT HEAD FINDINGS Brain: There is no mass, hemorrhage or extra-axial collection. The size and configuration of the ventricles and extra-axial CSF spaces are normal. The brain parenchyma is normal, without evidence of acute or chronic infarction. Vascular: No abnormal hyperdensity of the major intracranial arteries or dural venous sinuses. No intracranial atherosclerosis. Skull: The visualized skull base, calvarium and extracranial soft tissues are normal. CT MAXILLOFACIAL FINDINGS Osseous: --Complex facial fracture types: Recently internally fixed right zygomaticomaxillary complex fracture. Pterygoid plates are intact. Left second medic arch is normal. --Simple fracture types: None. --Mandible: There is a comminuted fracture of the right mandibular condyle, involving the condylar head. The condylar head is inferiorly and medially displaced. Orbits: Fractures of the right orbit are unchanged compared to 05/12/2019. Sinuses: Moderate mucosal thickening in the right maxillary sinus. Soft tissues: Normal visualized  extracranial soft tissues. CT CERVICAL SPINE FINDINGS Alignment: No static subluxation. Facets are aligned. Occipital condyles and the lateral masses of C1-C2 are aligned. Skull base and vertebrae: No acute fracture. Soft tissues and spinal canal: No prevertebral fluid or swelling. No visible canal hematoma. Disc levels: No advanced spinal canal or neural foraminal stenosis. Upper chest: No pneumothorax, pulmonary nodule or pleural effusion. Other: Normal visualized paraspinal cervical soft tissues. IMPRESSION: 1. No acute intracranial abnormality. 2. Comminuted fracture of the right mandibular condyle involving the condylar head. The condylar head is inferiorly and medially displaced at the temporomandibular joint. 3. Recent ORIF of right zygomaticomaxillary complex fracture. 4. No acute fracture or static subluxation of the cervical spine. Electronically Signed   By: Ulyses Jarred M.D.   On: 06/18/2019 21:51   CT Cervical Spine Wo Contrast  Result Date: 06/18/2019 CLINICAL DATA:  Fall EXAM: CT HEAD WITHOUT CONTRAST CT MAXILLOFACIAL WITHOUT CONTRAST CT CERVICAL SPINE WITHOUT CONTRAST TECHNIQUE: Multidetector CT imaging of the head, cervical spine, and maxillofacial structures were performed using the standard protocol without intravenous contrast. Multiplanar CT image reconstructions of the cervical spine and maxillofacial structures were also generated. COMPARISON:  05/12/2019 FINDINGS: CT HEAD FINDINGS Brain: There is no mass, hemorrhage or extra-axial collection. The size and configuration of the ventricles and extra-axial CSF spaces are normal. The brain parenchyma is normal, without evidence of acute or chronic infarction. Vascular: No abnormal hyperdensity of the major intracranial arteries or dural venous sinuses. No intracranial atherosclerosis. Skull: The visualized skull base, calvarium and extracranial soft tissues are normal. CT MAXILLOFACIAL FINDINGS Osseous: --Complex facial fracture types:  Recently internally fixed right zygomaticomaxillary complex fracture. Pterygoid plates are intact. Left second medic arch is normal. --Simple fracture types: None. --Mandible: There is a comminuted fracture of the right mandibular condyle, involving the condylar head. The condylar head is inferiorly and medially displaced. Orbits: Fractures of the right orbit are unchanged compared to 05/12/2019. Sinuses: Moderate mucosal thickening in the right maxillary sinus. Soft tissues: Normal visualized extracranial soft tissues. CT CERVICAL SPINE FINDINGS Alignment: No static subluxation. Facets are aligned. Occipital condyles and the lateral masses of C1-C2 are aligned. Skull base and vertebrae: No acute fracture. Soft tissues and spinal canal: No prevertebral fluid or swelling.  No visible canal hematoma. Disc levels: No advanced spinal canal or neural foraminal stenosis. Upper chest: No pneumothorax, pulmonary nodule or pleural effusion. Other: Normal visualized paraspinal cervical soft tissues. IMPRESSION: 1. No acute intracranial abnormality. 2. Comminuted fracture of the right mandibular condyle involving the condylar head. The condylar head is inferiorly and medially displaced at the temporomandibular joint. 3. Recent ORIF of right zygomaticomaxillary complex fracture. 4. No acute fracture or static subluxation of the cervical spine. Electronically Signed   By: Ulyses Jarred M.D.   On: 06/18/2019 21:51   CT Maxillofacial Wo Contrast  Result Date: 06/18/2019 CLINICAL DATA:  Fall EXAM: CT HEAD WITHOUT CONTRAST CT MAXILLOFACIAL WITHOUT CONTRAST CT CERVICAL SPINE WITHOUT CONTRAST TECHNIQUE: Multidetector CT imaging of the head, cervical spine, and maxillofacial structures were performed using the standard protocol without intravenous contrast. Multiplanar CT image reconstructions of the cervical spine and maxillofacial structures were also generated. COMPARISON:  05/12/2019 FINDINGS: CT HEAD FINDINGS Brain: There is no  mass, hemorrhage or extra-axial collection. The size and configuration of the ventricles and extra-axial CSF spaces are normal. The brain parenchyma is normal, without evidence of acute or chronic infarction. Vascular: No abnormal hyperdensity of the major intracranial arteries or dural venous sinuses. No intracranial atherosclerosis. Skull: The visualized skull base, calvarium and extracranial soft tissues are normal. CT MAXILLOFACIAL FINDINGS Osseous: --Complex facial fracture types: Recently internally fixed right zygomaticomaxillary complex fracture. Pterygoid plates are intact. Left second medic arch is normal. --Simple fracture types: None. --Mandible: There is a comminuted fracture of the right mandibular condyle, involving the condylar head. The condylar head is inferiorly and medially displaced. Orbits: Fractures of the right orbit are unchanged compared to 05/12/2019. Sinuses: Moderate mucosal thickening in the right maxillary sinus. Soft tissues: Normal visualized extracranial soft tissues. CT CERVICAL SPINE FINDINGS Alignment: No static subluxation. Facets are aligned. Occipital condyles and the lateral masses of C1-C2 are aligned. Skull base and vertebrae: No acute fracture. Soft tissues and spinal canal: No prevertebral fluid or swelling. No visible canal hematoma. Disc levels: No advanced spinal canal or neural foraminal stenosis. Upper chest: No pneumothorax, pulmonary nodule or pleural effusion. Other: Normal visualized paraspinal cervical soft tissues. IMPRESSION: 1. No acute intracranial abnormality. 2. Comminuted fracture of the right mandibular condyle involving the condylar head. The condylar head is inferiorly and medially displaced at the temporomandibular joint. 3. Recent ORIF of right zygomaticomaxillary complex fracture. 4. No acute fracture or static subluxation of the cervical spine. Electronically Signed   By: Ulyses Jarred M.D.   On: 06/18/2019 21:51     ____________________________________________   PROCEDURES   Procedure(s) performed (including Critical Care):  .Critical Care Performed by: Hinda Kehr, MD Authorized by: Hinda Kehr, MD   Critical care provider statement:    Critical care time (minutes):  30   Critical care time was exclusive of:  Separately billable procedures and treating other patients   Critical care was necessary to treat or prevent imminent or life-threatening deterioration of the following conditions:  Trauma   Critical care was time spent personally by me on the following activities:  Development of treatment plan with patient or surrogate, discussions with consultants, evaluation of patient's response to treatment, examination of patient, obtaining history from patient or surrogate, ordering and performing treatments and interventions, ordering and review of laboratory studies, ordering and review of radiographic studies, pulse oximetry, re-evaluation of patient's condition and review of old charts     ____________________________________________   [facility] / MDM / ASSESSMENT AND PLAN /  ED COURSE  As part of my medical decision making, I reviewed the following data within the [place] notes reviewed and incorporated, Labs reviewed , EKG interpreted , Old chart reviewed, Radiograph reviewed , Discussed with admitting physician  and Notes from prior ED visits   Differential diagnosis includes, but is not limited to, mandibular fracture, open fracture of the face or head, temporal bone fracture, intracranial injury, cervical spine injury.  Fortunately the patient's head CT and cervical spine CTs did not show any acute intracranial or cervical spine injury and clinically the patient has no neck pain and I am reassured that he does not have a C-spine fracture nor spinal cord injury.  However his maxillofacial CT demonstrates a substantial comminuted fracture of the  right mandibular condyle with inferior and medial displacement.    The patient has been evaluated for syncope in the past and is asymptomatic, no indication that there is any seizure activity.  He has some anterior chest wall tenderness consistent with a musculoskeletal contusion but his chest x-ray did not demonstrate any fractures and he is in no distress unless he pushes on the left anterior upper chest wall.  No abdominal pain.  He has right hemotympanum which is of some concern.  I have ordered Unasyn 3 g IV as empiric/prophylactic antibiotics for the possibility of an open fracture though there is no blood in the oropharynx.  Given that he was operated on by Dr. Mardee Postin a month ago at [facility] and that our facility does not have OMFS nor ENT that can address mandibular issues, I am contacting [facility] to discuss a trauma transfer.  Patient agrees with the plan.       Clinical Course as of Jun 19 135  Thu Jun 18, 2019  2334 Speaking with [facility] about transferring the patient to [facility] ED as a yellow trauma given the nature of the injuries.   [CF]  2338 Discussed with Dr. Melina Fiddler with the ED.  She requests that I speak with ENT and see if they will take the patient directly.  Awaiting callback.   [CF]  Fri Jun 19, 2019  0035 I was connected with Dr. Jerene Canny with plastic surgery (they are covering facial trauma).  She felt it was appropriate to transfer the patient to the [facility] ED to determine which service would take the patient (ENT vs Plastics).  I am now holding again for Dr. Melina Fiddler.   [CF]  [id number] Dr. Melina Fiddler accepted the patient to the ED.  Arranging transport.  Patient updated.  No concern for COVID at this time and [facility] supplies are being rationed, will not send swab since patient will need to be transferred regardless.   [CF]    Clinical Course User Index [CF] Hinda Kehr, MD     ____________________________________________  FINAL CLINICAL IMPRESSION(S) / ED  DIAGNOSES  Final diagnoses:  Closed fracture of right mandibular angle, initial encounter ([place])  Hemotympanum, right  Fall, initial encounter  Syncope and collapse  Acute chest wall pain  Multiple skin tears  Chin laceration, initial encounter     MEDICATIONS GIVEN DURING THIS VISIT:  Medications  dextrose 5 %-0.9 % sodium chloride infusion ( Intravenous New Bag/Given 06/19/19 0037)  Ampicillin-Sulbactam (UNASYN) 3 g in sodium chloride 0.9 % 100 mL IVPB (3 g Intravenous New Bag/Given 06/19/19 0040)  morphine 4 MG/ML injection 4 mg (4 mg Intravenous Given 06/19/19 0039)  ondansetron (ZOFRAN) injection 4 mg (4 mg Intravenous Given 06/19/19  [place])     ED Discharge Orders    None      *Please note:  Johnny Wolfe was evaluated in Emergency Department on 06/19/2019 for the symptoms described in the history of present illness. He was evaluated in the context of the global COVID-19 pandemic, which necessitated consideration that the patient might be at risk for infection with the SARS-CoV-2 virus that causes COVID-19. Institutional protocols and algorithms that pertain to the evaluation of patients at risk for COVID-19 are in a state of rapid change based on information released by regulatory bodies including the CDC and federal and state organizations. These policies and algorithms were followed during the patient's care in the ED.  Some ED evaluations and interventions may be delayed as a result of limited staffing during the pandemic.*  Note:  This document was prepared using Dragon voice recognition software and may include unintentional dictation errors.   Hinda Kehr, MD 06/19/19 [place]    Hinda Kehr, MD 06/19/19 [phone]

## 2019-06-18 NOTE — ED Triage Notes (Signed)
Pt arrived with wife to ED post fall. Per pt, he was dizzy and had syncopal episode, fell to the floor. Pt hit head on hardwood floor. Pt has approx. 1 inch laceration under chin, has abrasions to the left forearm and bleeding coming from the right hear. Pt c/o pain to the upper chest where he fell and under chin. Per wife, pt had recent surgery to the right jaw due to fall.

## 2019-06-19 DIAGNOSIS — S0240EA Zygomatic fracture, right side, initial encounter for closed fracture: Secondary | ICD-10-CM | POA: Diagnosis not present

## 2019-06-19 DIAGNOSIS — H7291 Unspecified perforation of tympanic membrane, right ear: Secondary | ICD-10-CM | POA: Diagnosis not present

## 2019-06-19 DIAGNOSIS — I951 Orthostatic hypotension: Secondary | ICD-10-CM | POA: Diagnosis not present

## 2019-06-19 DIAGNOSIS — Z794 Long term (current) use of insulin: Secondary | ICD-10-CM | POA: Diagnosis not present

## 2019-06-19 DIAGNOSIS — R001 Bradycardia, unspecified: Secondary | ICD-10-CM | POA: Diagnosis not present

## 2019-06-19 DIAGNOSIS — S0181XA Laceration without foreign body of other part of head, initial encounter: Secondary | ICD-10-CM | POA: Diagnosis not present

## 2019-06-19 DIAGNOSIS — R55 Syncope and collapse: Secondary | ICD-10-CM | POA: Insufficient documentation

## 2019-06-19 DIAGNOSIS — I2510 Atherosclerotic heart disease of native coronary artery without angina pectoris: Secondary | ICD-10-CM | POA: Diagnosis not present

## 2019-06-19 DIAGNOSIS — I10 Essential (primary) hypertension: Secondary | ICD-10-CM | POA: Diagnosis not present

## 2019-06-19 DIAGNOSIS — Z743 Need for continuous supervision: Secondary | ICD-10-CM | POA: Diagnosis not present

## 2019-06-19 DIAGNOSIS — Z87891 Personal history of nicotine dependence: Secondary | ICD-10-CM | POA: Diagnosis not present

## 2019-06-19 DIAGNOSIS — S0219XA Other fracture of base of skull, initial encounter for closed fracture: Secondary | ICD-10-CM | POA: Diagnosis not present

## 2019-06-19 DIAGNOSIS — S02601A Fracture of unspecified part of body of right mandible, initial encounter for closed fracture: Secondary | ICD-10-CM | POA: Diagnosis not present

## 2019-06-19 DIAGNOSIS — Z7982 Long term (current) use of aspirin: Secondary | ICD-10-CM | POA: Diagnosis not present

## 2019-06-19 DIAGNOSIS — E119 Type 2 diabetes mellitus without complications: Secondary | ICD-10-CM | POA: Diagnosis not present

## 2019-06-19 DIAGNOSIS — Z7984 Long term (current) use of oral hypoglycemic drugs: Secondary | ICD-10-CM | POA: Diagnosis not present

## 2019-06-19 DIAGNOSIS — Z20822 Contact with and (suspected) exposure to covid-19: Secondary | ICD-10-CM | POA: Diagnosis not present

## 2019-06-19 DIAGNOSIS — S0990XA Unspecified injury of head, initial encounter: Secondary | ICD-10-CM | POA: Diagnosis not present

## 2019-06-19 DIAGNOSIS — E860 Dehydration: Secondary | ICD-10-CM | POA: Diagnosis not present

## 2019-06-19 DIAGNOSIS — S0240ES Zygomatic fracture, right side, sequela: Secondary | ICD-10-CM | POA: Diagnosis not present

## 2019-06-19 DIAGNOSIS — I252 Old myocardial infarction: Secondary | ICD-10-CM | POA: Diagnosis not present

## 2019-06-19 DIAGNOSIS — S02611A Fracture of condylar process of right mandible, initial encounter for closed fracture: Secondary | ICD-10-CM | POA: Diagnosis not present

## 2019-06-19 DIAGNOSIS — H9201 Otalgia, right ear: Secondary | ICD-10-CM | POA: Diagnosis not present

## 2019-06-19 DIAGNOSIS — R6884 Jaw pain: Secondary | ICD-10-CM | POA: Diagnosis not present

## 2019-06-19 DIAGNOSIS — Z955 Presence of coronary angioplasty implant and graft: Secondary | ICD-10-CM | POA: Diagnosis not present

## 2019-06-19 DIAGNOSIS — S02611D Fracture of condylar process of right mandible, subsequent encounter for fracture with routine healing: Secondary | ICD-10-CM | POA: Diagnosis not present

## 2019-06-19 DIAGNOSIS — H9121 Sudden idiopathic hearing loss, right ear: Secondary | ICD-10-CM | POA: Diagnosis not present

## 2019-06-19 MED ORDER — DEXTROSE-NACL 5-0.9 % IV SOLN: INTRAVENOUS | Status: DC

## 2019-06-19 MED ORDER — MORPHINE SULFATE (PF) 4 MG/ML IV SOLN
4.0000 mg | Freq: Once | INTRAVENOUS | Status: AC
  Administered 2019-06-19: 4 mg via INTRAVENOUS
  Filled 2019-06-19: qty 1

## 2019-06-19 MED ORDER — ONDANSETRON HCL 4 MG/2ML IJ SOLN
4.0000 mg | INTRAMUSCULAR | Status: AC
  Administered 2019-06-19: 4 mg via INTRAVENOUS
  Filled 2019-06-19: qty 2

## 2019-06-19 NOTE — ED Notes (Signed)
EMTALA reviewed by this RN.  

## 2019-06-20 DIAGNOSIS — S0219XA Other fracture of base of skull, initial encounter for closed fracture: Secondary | ICD-10-CM | POA: Diagnosis not present

## 2019-06-20 DIAGNOSIS — I10 Essential (primary) hypertension: Secondary | ICD-10-CM | POA: Diagnosis not present

## 2019-06-20 DIAGNOSIS — R55 Syncope and collapse: Secondary | ICD-10-CM | POA: Diagnosis not present

## 2019-06-20 DIAGNOSIS — S0240EA Zygomatic fracture, right side, initial encounter for closed fracture: Secondary | ICD-10-CM | POA: Diagnosis not present

## 2019-06-21 DIAGNOSIS — S0240EA Zygomatic fracture, right side, initial encounter for closed fracture: Secondary | ICD-10-CM | POA: Diagnosis not present

## 2019-06-21 DIAGNOSIS — R55 Syncope and collapse: Secondary | ICD-10-CM | POA: Diagnosis not present

## 2019-06-21 DIAGNOSIS — S0219XA Other fracture of base of skull, initial encounter for closed fracture: Secondary | ICD-10-CM | POA: Diagnosis not present

## 2019-06-21 DIAGNOSIS — Z794 Long term (current) use of insulin: Secondary | ICD-10-CM | POA: Diagnosis not present

## 2019-06-21 DIAGNOSIS — E119 Type 2 diabetes mellitus without complications: Secondary | ICD-10-CM | POA: Diagnosis not present

## 2019-06-22 ENCOUNTER — Telehealth: Payer: Self-pay | Admitting: Cardiovascular Disease

## 2019-06-22 DIAGNOSIS — S0219XA Other fracture of base of skull, initial encounter for closed fracture: Secondary | ICD-10-CM | POA: Diagnosis not present

## 2019-06-22 DIAGNOSIS — S0240EA Zygomatic fracture, right side, initial encounter for closed fracture: Secondary | ICD-10-CM | POA: Diagnosis not present

## 2019-06-22 DIAGNOSIS — R55 Syncope and collapse: Secondary | ICD-10-CM | POA: Diagnosis not present

## 2019-06-22 MED ORDER — PREDNISOLONE ACETATE 1 % OP SUSP
5.00 | OPHTHALMIC | Status: DC
Start: 2019-06-22 — End: 2019-06-22

## 2019-06-22 MED ORDER — SODIUM CHLORIDE 0.9 % IV SOLN
100.00 | INTRAVENOUS | Status: DC
Start: ? — End: 2019-06-22

## 2019-06-22 MED ORDER — ASPIRIN 81 MG PO CHEW
81.00 | CHEWABLE_TABLET | ORAL | Status: DC
Start: 2019-06-23 — End: 2019-06-22

## 2019-06-22 MED ORDER — CIPROFLOXACIN HCL 0.3 % OP SOLN
5.00 | OPHTHALMIC | Status: DC
Start: 2019-06-22 — End: 2019-06-22

## 2019-06-22 MED ORDER — MAGNESIUM SULFATE IN D5W 1-5 GM/100ML-% IV SOLN
1.00 | INTRAVENOUS | Status: DC
Start: 2019-06-22 — End: 2019-06-22

## 2019-06-22 MED ORDER — INSULIN LISPRO 100 UNIT/ML ~~LOC~~ SOLN
0.00 | SUBCUTANEOUS | Status: DC
Start: 2019-06-22 — End: 2019-06-22

## 2019-06-22 MED ORDER — DEXTROSE 50 % IV SOLN
12.50 | INTRAVENOUS | Status: DC
Start: ? — End: 2019-06-22

## 2019-06-22 MED ORDER — ATORVASTATIN CALCIUM 40 MG PO TABS
40.00 | ORAL_TABLET | ORAL | Status: DC
Start: 2019-06-23 — End: 2019-06-22

## 2019-06-22 MED ORDER — TRAMADOL HCL 50 MG PO TABS
50.00 | ORAL_TABLET | ORAL | Status: DC
Start: ? — End: 2019-06-22

## 2019-06-22 NOTE — Telephone Encounter (Signed)
Patient states he fell last Thursday night and is currently admitted at [facility]. Patient states [facility] thinks it is his BP medication that made him passout, which caused his fall. States he had an echocardiogram. Please call to discuss.

## 2019-06-22 NOTE — Telephone Encounter (Signed)
Spoke with patient and he is currently being discharged from [facility]. He reports that he passed out and they felt it was his blood pressure medications and they also did echocardiogram while he was there. Advised that I would make Dr. Rockey Situ aware of this and would call back with any recommendations. He verbalized understanding with no further questions at this time.

## 2019-06-24 NOTE — Telephone Encounter (Signed)
Needs follow-up post hospitalization to discuss medications

## 2019-06-25 ENCOUNTER — Encounter: Payer: Self-pay | Admitting: Cardiovascular Disease

## 2019-06-25 ENCOUNTER — Other Ambulatory Visit: Payer: Self-pay

## 2019-06-25 ENCOUNTER — Encounter: Payer: Self-pay | Admitting: Nurse Practitioner

## 2019-06-25 ENCOUNTER — Inpatient Hospital Stay: Payer: PPO | Admitting: Nurse Practitioner

## 2019-06-25 ENCOUNTER — Ambulatory Visit (INDEPENDENT_AMBULATORY_CARE_PROVIDER_SITE_OTHER): Payer: PPO | Admitting: Cardiovascular Disease

## 2019-06-25 ENCOUNTER — Other Ambulatory Visit: Payer: PPO

## 2019-06-25 ENCOUNTER — Telehealth (INDEPENDENT_AMBULATORY_CARE_PROVIDER_SITE_OTHER): Payer: PPO | Admitting: Nurse Practitioner

## 2019-06-25 VITALS — BP 122/66 | HR 118 | Ht 72.0 in | Wt 152.0 lb

## 2019-06-25 VITALS — BP 96/64 | HR 109 | Temp 96.5°F | Wt 150.0 lb

## 2019-06-25 DIAGNOSIS — R55 Syncope and collapse: Secondary | ICD-10-CM

## 2019-06-25 DIAGNOSIS — I959 Hypotension, unspecified: Secondary | ICD-10-CM | POA: Insufficient documentation

## 2019-06-25 DIAGNOSIS — I951 Orthostatic hypotension: Secondary | ICD-10-CM

## 2019-06-25 DIAGNOSIS — D649 Anemia, unspecified: Secondary | ICD-10-CM | POA: Diagnosis not present

## 2019-06-25 DIAGNOSIS — I25118 Atherosclerotic heart disease of native coronary artery with other forms of angina pectoris: Secondary | ICD-10-CM | POA: Diagnosis not present

## 2019-06-25 DIAGNOSIS — E119 Type 2 diabetes mellitus without complications: Secondary | ICD-10-CM | POA: Diagnosis not present

## 2019-06-25 DIAGNOSIS — I10 Essential (primary) hypertension: Secondary | ICD-10-CM

## 2019-06-25 DIAGNOSIS — Z794 Long term (current) use of insulin: Secondary | ICD-10-CM

## 2019-06-25 MED ORDER — MIDODRINE HCL 10 MG PO TABS: 10.0000 mg | ORAL_TABLET | Freq: Three times a day (TID) | ORAL | 1 refills | Status: DC

## 2019-06-25 NOTE — Assessment & Plan Note (Signed)
Ongoing, intermittent.  Unknown etiology.  Recent new medications include Jardiance and Ozempic with A1C 6.4% and less hypoglycemic episodes than when was on insulin.  H/H slightly low on recent [facility] labs and reports weight loss over past several months, prior to [place].  No current BP medications, will continue off these.  No GI symptoms.  Have recommended he follow-up with cardiology ASAP.  Will obtain outpatient labs to include CBC, CMP, anemia panel, fecal occult blood, TSH, + repeat Mag level.  Follow-up with ENT at [facility].  Return in one week in office for visit, sooner if worsening.

## 2019-06-25 NOTE — Patient Instructions (Addendum)
Medication Instructions:  Please start midodine 5 to 10 mg three times a day for blood pressure  Monitor blood pressure at home,  Including orthostatics  If you need a refill on your cardiac medications before your next appointment, please call your pharmacy.    Lab work: No new labs needed   If you have labs (blood work) drawn today and your tests are completely normal, you will receive your results only by: Marland Kitchen MyChart Message (if you have MyChart) OR . A paper copy in the mail If you have any lab test that is abnormal or we need to change your treatment, we will call you to review the results.   Testing/Procedures: No new testing needed   Follow-Up: At [facility], you and your health needs are our priority.  As part of our continuing mission to provide you with exceptional heart care, we have created designated Provider Care Teams.  These Care Teams include your primary Cardiologist (physician) and Advanced Practice Providers (APPs -  Physician Assistants and Nurse Practitioners) who all work together to provide you with the care you need, when you need it.  . You will need a follow up appointment in 1 month   . Providers on your designated Care Team:   . Murray Hodgkins, NP . Christell Faith, PA-C . Marrianne Mood, PA-C  Any Other Special Instructions Will Be Listed Below (If Applicable).  For educational health videos Log in to : [email or web address] Or : [email or web address], password : [email or web address]

## 2019-06-25 NOTE — Progress Notes (Addendum)
BP 96/64   Pulse (!) 109   Temp (!) 96.5 F (35.8 C) (Oral)   Wt 150 lb (68 kg)   BMI 20.34 kg/m    Subjective:    Patient ID: Johnny Wolfe, male    DOB: 19-Sep-1945, 74 y.o.   MRN: [id number]  HPI: Johnny Wolfe is a 74 y.o. male  Chief Complaint  Patient presents with  . ER Follow Up    pt states he had a fall a week ago today. States he passed out, fell, and broke his jaw. States his BP has been dropping when he stands, states he thinks this may have been what happened and caused the fall last week     . This visit was completed via MyChart due to the restrictions of the COVID-19 pandemic. All issues as above were discussed and addressed. Physical exam was done as above through visual confirmation on MyChart. If it was felt that the patient should be evaluated in the office, they were directed there. The patient verbally consented to this visit. . Location of the patient: home . Location of the provider: home . Those involved with this call:  . Provider: Marnee Guarneri, DNP . CMA: Yvonna Alanis, CMA . Front Desk/Registration: Don Perking  . Time spent on call: 25 minutes with patient face to face via video conference. More than 50% of this time was spent in counseling and coordination of care. 10 minutes total spent in review of patient's record and preparation of their chart.  . I verified patient identity using two factors (patient name and date of birth). Patient consents verbally to being seen via telemedicine visit today.    ER FOLLOW UP  Had closed fracture of right mandible after fall on 05/12/2019, being followed by Dr. Mardee Postin with ENT at [facility].  Recently seen in [facility] ER on 06/18/2019 and then again on 06/19/2019 at [facility] ER for syncope, fall, and right chest wall pain.  With recent fall he was watching television till  9 pm on the 18th.  He got up and walked about 10 steps to plug in iPad on his book shelf, then started feeling dizzy and passed out before he could  sit down.  Woke-up with gash on chin and knot to right hand.  A few days before he passed out he did not feel right, he checked BP on 15th and it was 97/65 and then morning before he passed out if was 99/64.  He thinks he was dehydrated, as had not been drinking much and states he had not been eating much.  Lost 10 pounds in past six month per his report, did start on Ozempic in December.    Magnesium was low in ER, 1.4, he was sent home with magnesium to take.  For a couple years has had minimal appetite, but since October this has become worse, he thought this was related to discontinuation of insulin.  Reports over past couple days appetite has come back.  His blood sugars at home 150 in morning on average, yesterday and today 139.  He reports A1C at [facility] 6.4%, last in office January 2021 == 6.3%.  Continues to have right lower jaw discomfort, is being followed by ENT and reports they state he does not need surgery; however he states ER provider felt "I did need surgery".  Had bone fragments that broke into right ear and "causes it to be hard to hear", but this is ear he has trouble hearing from at baseline  for year.    Has been checking BP standing and sitting at home, when he checks this sitting 105/73 to 121/74, never any higher, Pulse has been in 100 range.  When he stands the SBP drops about 20 points and DBP 10 points + pulse increases.  Metoprolol recently discontinued during ER visit.  No current BP or HR medications.   Time since discharge:  Hospital[facility]facility: [facility] Diagnosis: Syncope, acute chest wall pain Procedures/tests: multiple imaging and labs performed (reviewed) Consultants: none New medications: none Discharge instructions:  Follow-up with PCP Status: better   Relevant past medical, surgical, family and social history reviewed and updated as indicated. Interim medical history since our last visit reviewed. Allergies and medications reviewed and updated.  Review of  Systems  Constitutional: Positive for appetite change and unexpected weight change. Negative for activity change, diaphoresis, fatigue and fever.  Respiratory: Negative for cough, chest tightness, shortness of breath and wheezing.   Cardiovascular: Positive for chest pain (intermittent right side). Negative for palpitations and leg swelling.  Gastrointestinal: Negative.   Endocrine: Negative for polydipsia, polyphagia and polyuria.  Neurological: Negative for dizziness, seizures, speech difficulty, weakness, light-headedness, numbness and headaches.  Psychiatric/Behavioral: Negative.     Per HPI unless specifically indicated above     Objective:    BP 96/64   Pulse (!) 109   Temp (!) 96.5 F (35.8 C) (Oral)   Wt 150 lb (68 kg)   BMI 20.34 kg/m   Wt Readings from Last 3 Encounters:  06/25/19 152 lb (68.9 kg)  06/25/19 150 lb (68 kg)  05/12/19 160 lb (72.6 kg)    Physical Exam Vitals and nursing note reviewed.  Constitutional:      General: He is awake. He is not in acute distress.    Appearance: He is well-developed. He is not ill-appearing.  HENT:     Head: Normocephalic.     Right Ear: Hearing normal. No drainage.     Left Ear: Hearing normal. No drainage.  Eyes:     General: Lids are normal.        Right eye: No discharge.        Left eye: No discharge.     Conjunctiva/sclera: Conjunctivae normal.  Pulmonary:     Effort: Pulmonary effort is normal. No accessory muscle usage or respiratory distress.  Musculoskeletal:     Cervical back: Normal range of motion.  Neurological:     Mental Status: He is alert and oriented to person, place, and time.     Cranial Nerves: Cranial nerves are intact.  Psychiatric:        Mood and Affect: Mood normal.        Behavior: Behavior normal. Behavior is cooperative.        Thought Content: Thought content normal.        Judgment: Judgment normal.     Results for orders placed or performed during the hospital encounter of  [id number]  Basic metabolic panel  Result Value Ref Range   Sodium 140 135 - 145 mmol/L   Potassium 4.3 3.5 - 5.1 mmol/L   Chloride 102 98 - 111 mmol/L   CO2 21 (L) 22 - 32 mmol/L   Glucose, Bld 73 70 - 99 mg/dL   BUN 13 8 - 23 mg/dL   Creatinine, Ser 0.76 0.61 - 1.24 mg/dL   Calcium 9.7 8.9 - 10.3 mg/dL   GFR calc non Af Amer >60 >60 mL/min   GFR calc Af Amer >60 >  60 mL/min   Anion gap 17 (H) 5 - 15  CBC  Result Value Ref Range   WBC 10.2 4.0 - 10.5 K/uL   RBC 4.24 4.22 - 5.81 MIL/uL   Hemoglobin 13.4 13.0 - 17.0 g/dL   HCT 41.7 39.0 - 52.0 %   MCV 98.3 80.0 - 100.0 fL   MCH 31.6 26.0 - 34.0 pg   MCHC 32.1 30.0 - 36.0 g/dL   RDW 14.0 11.5 - 15.5 %   Platelets 237 150 - 400 K/uL   nRBC 0.0 0.0 - 0.2 %   [facility] CBC: WBC 9.3 4.5 - 11 10*9/L  RBC 3.82 (L) 4.5 - 5.9 10*12/L  HGB 12.2 (L) 13.5 - 17.5 g/dL  HCT 38.5 (L) 41 - 53 %  MCV 100.8 (H) 80 - 100 fL  MCH 31.9 26 - 34 pg  MCHC 31.6 31 - 37 g/dL  RDW 14.5 12 - 15 %  MPV 10.4 (H) 7 - 10 fL  Platelet 226 150 - 440 10*9/L  Neutrophils % 68.8 %  Lymphocytes % 18.6 %  Monocytes % 6.2 %  Eosinophils % 3.6 %  Basophils % 0.8 %  Absolute Neutrophils 6.4 2 - 7.5 10*9/L  Absolute Lymphocytes 1.7 1.5 - 5 10*9/L  Absolute Monocytes 0.6 0.2 - 0.8 10*9/L  Absolute Eosinophils 0.3 0 - 0.4 10*9/L  Absolute Basophils 0.1 0 - 0.1 10*9/L  Large Unstained Cells 2 0 - 4 %  Macrocytosis Moderate (A) Not Present    TSH LEVEL [facility] 06/19/2019   3.791 (H)  1.54 (H)  1.0    Assessment & Plan:   Problem List Items Addressed This Visit      Cardiovascular and Mediastinum   Syncope - Primary    Ongoing, intermittent.  Unknown etiology.  Recent new medications include Jardiance and Ozempic with A1C 6.4% and less hypoglycemic episodes than when was on insulin.  H/H slightly low on recent [facility] labs and reports weight loss over past several months, prior to [place].  No current BP medications, will continue off these.  No GI symptoms.  Have  recommended he follow-up with cardiology ASAP.  Will obtain outpatient labs to include CBC, CMP, anemia panel, fecal occult blood, TSH, + repeat Mag level.  Follow-up with ENT at [facility].  Return in one week in office for visit, sooner if worsening.      Relevant Orders   Comprehensive metabolic panel   TSH   Fecal occult blood, imunochemical   Low BP    New onset over past months with elevation in HR.  Continue to monitor BP closely at home.  Ensure increase hydration and meal intake daily.  Wear compression hose daily, on during day and off at night.  Obtain outpatient labs to include CBC, CMP, anemia panel, TSH, Mag level.  Return in one week or sooner if worsening.  He if following up with cardiology today.        Other   Hypomagnesemia    Recheck mag level outpatient and continue supplement.        Relevant Orders   Comprehensive metabolic panel   Magnesium    Other Visit Diagnoses    Low hemoglobin       Recheck CBC and anemia panel outpatient.    Relevant Orders   Anemia panel   CBC with Differential/Platelet   Fecal occult blood, imunochemical      I discussed the assessment and treatment plan with the patient. The patient was provided  an opportunity to ask questions and all were answered. The patient agreed with the plan and demonstrated an understanding of the instructions.   The patient was advised to call back or seek an in-person evaluation if the symptoms worsen or if the condition fails to improve as anticipated.   I provided 21+ minutes of time during this encounter.  Follow up plan: Return in about 1 week (around 07/02/2019) for Syncope in office visit.

## 2019-06-25 NOTE — Patient Instructions (Signed)
     Syncope Syncope is when you pass out (faint) for a short time. It is caused by a sudden decrease in blood flow to the brain. Signs that you may be about to pass out include:  Feeling dizzy or light-headed.  Feeling sick to your stomach (nauseous).  Seeing all white or all black.  Having cold, clammy skin. If you pass out, get help right away. Call your local emergency services (911 in the [place] Do not drive yourself to the hospital. Follow these instructions at home: Watch for any changes in your symptoms. Take these actions to stay safe and help with your symptoms: Lifestyle  Do not drive, use machinery, or play sports until your doctor says it is okay.  Do not drink alcohol.  Do not use any products that contain nicotine or tobacco, such as cigarettes and e-cigarettes. If you need help quitting, ask your doctor.  Drink enough fluid to keep your pee (urine) pale yellow. General instructions  Take over-the-counter and prescription medicines only as told by your doctor.  If you are taking blood pressure or heart medicine, sit up and stand up slowly. Spend a few minutes getting ready to sit and then stand. This can help you feel less dizzy.  Have someone stay with you until you feel stable.  If you start to feel like you might pass out, lie down right away and raise (elevate) your feet above the level of your heart. Breathe deeply and steadily. Wait until all of the symptoms are gone.  Keep all follow-up visits as told by your doctor. This is important. Get help right away if:  You have a very bad headache.  You pass out once or more than once.  You have pain in your chest, belly, or back.  You have a very fast or uneven heartbeat (palpitations).  It hurts to breathe.  You are bleeding from your mouth or your bottom (rectum).  You have black or tarry poop (stool).  You have jerky movements that you cannot control (seizure).  You are confused.  You have  trouble walking.  You are very weak.  You have vision problems. These symptoms may be an emergency. Do not wait to see if the symptoms will go away. Get medical help right away. Call your local emergency services (911 in the [place] Do not drive yourself to the hospital. Summary  Syncope is when you pass out (faint) for a short time. It is caused by a sudden decrease in blood flow to the brain.  Signs that you may be about to faint include feeling dizzy, light-headed, or sick to your stomach, seeing all white or all black, or having cold, clammy skin.  If you start to feel like you might pass out, lie down right away and raise (elevate) your feet above the level of your heart. Breathe deeply and steadily. Wait until all of the symptoms are gone. This information is not intended to replace advice given to you by your health care provider. Make sure you discuss any questions you have with your health care provider. Document Revised: 05/29/2017 Document Reviewed: 05/29/2017 Elsevier Patient Education  2020 [employer]  

## 2019-06-25 NOTE — Progress Notes (Signed)
Cardiology Office Note  Date:  06/25/2019   ID:  Johnny Wolfe, DOB 12-18-1945, MRN [id number]  PCP:  Johnny Lick, NP   Chief Complaint  Patient presents with  . Other    ED at [facility]. Meds reviewed verbally with patient.     HPI:  [name] is a 74 yo old gentleman with past medical history of Diabetes, Hemoglobin A1c 6.3 Hypertension Hyperlipidemia Smoker quit 2013 ETOH Coronary calcium score of 2310. Presenting for follow-up of his syncope and CT coronary calcium scoring Who presents for follow-up of his coronary disease with stent to the RCA  Fall, seen in the ER 05/12/2019  tripped on part of his rug, falling and striking the right side of his face on the floor. facial fracture requiring ORIF by Dr. Mardee Postin with ENT at [facility]   Decreased fluid and food intake Weight loss , 10 pounds Low pressures [id number] systolic,   FAll, [id number], in the ER   syncopal episode at home which resulted in an injury to his chin and jaw got up tonight and felt dizzy and is not quite certain what happened but woke up on the floor with an odd and "full" feeling in his right ear and some bleeding under his chin. Transferred to [facility] Has IVF 2 1/2 days  Echo at [facility] The left ventricle is normal in size with normal wall thickness. The left ventricular systolic function is normal, LVEF is visually estimated at > 55%. The right ventricle is normal in size, with normal systolic function.  Seen by primary care today, Heart rate 109 bpm Off all BP meds total 20 pounds weight loss  Jardiance possibly causing poor appetitie  Magnesium 1.2, up to 1.4 at [facility], last on 06/22/19  Orthostatics at home: positive past week, standing systolic pressure to 80 -90 Sitting: 108 to 118/70 to 80 With standing: 91/69, after 3 min 105/79  Today, less nausea with eating, eating 3 meals a day Trying to drink more, drinking protein drink as well  Hemoglobin A1c 6.3 Off insulin  on jardiance  EKG  personally reviewed by myself on todays visit Shows normal sinus rhythm rate 118 bpm no significant ST or T wave changes  Other past medical history reviewed Cardiac cath 08/02/2017 Severe proximal RCA disease, with stent placement Moderate proximal LAD and LCX disease Mild mid LAD, RCA disease, Vessels are heavily calcified. Normal EF estimated at 55%  Coronary calcium score of 2310. This was 95 percentile for age and sex matched control. high calcium score.  severe heavy three-vessel disease  mild aortic atherosclerosis  Syncope: 05/21/2017 On [employer] to bed 7pm Woke up on the floor Remembered went to bathroom, woke up on floor, dark in room Then went to bathroom again,  Feeling for light Woke up again in the bathtub, little knot on head, Got up out of bathroom,  Little dizzy,  Sugars were 80  Between 1/22 and 2/11 Was on BP medication, lisinopril 5 BP 88/50, running low consistently before lisinopril was stopped 07/08/22  Father died 42s cancer Mother Obese, DM, cad, stent age 58, now age 16, now pacer Brother CAD, PCi   PMH:   has a past medical history of CAD (coronary artery disease), Cataract, Diabetes mellitus without complication ([place]), Essential hypertension, Hyperlipidemia, Syncope, and Wears dentures.  PSH:    Past Surgical History:  Procedure Laterality Date  . APPENDECTOMY    . CATARACT EXTRACTION, BILATERAL     August 2017  .  CORONARY STENT INTERVENTION N/A 08/02/2017   Procedure: CORONARY STENT INTERVENTION;  Surgeon: Wellington Hampshire, MD;  Location: [facility];  Service: Cardiovascular;  Laterality: N/A;  . LEFT HEART CATH AND CORONARY ANGIOGRAPHY Left 08/02/2017   Procedure: LEFT HEART CATH AND CORONARY ANGIOGRAPHY;  Surgeon: Minna Merritts, MD;  Location: [facility];  Service: Cardiovascular;  Laterality: Left;  Marland Kitchen MASS EXCISION Left 07/04/2017   Procedure: REMOVAL TWO CYST EAR EAR LOBE AND EXTERNAL EAR;  Surgeon: Beverly Gust, MD;  Location: [facility];  Service: ENT;  Laterality: Left;  LOCAL Diabetic - insulin and oral meds    Current Outpatient Medications  Medication Sig Dispense Refill  . ascorbic acid (VITAMIN C) 500 MG tablet Take 500 mg by mouth daily.    Marland Kitchen aspirin EC 81 MG tablet Take 81 mg by mouth daily.    Marland Kitchen atorvastatin (LIPITOR) 40 MG tablet TAKE 1 TABLET BY MOUTH  DAILY AT 6 PM 90 tablet 3  . chlorhexidine (PERIDEX) 0.12 % solution 15 mLs 2 (two) times daily.    . ciprofloxacin (CILOXAN) 0.3 % ophthalmic solution Administer 5 drops into both ears Two (2) times a day.    . empagliflozin (JARDIANCE) 25 MG TABS tablet Take 25 mg by mouth daily before breakfast. 90 tablet 1  . glucose blood (ONE TOUCH ULTRA TEST) test strip Check blood 2-3 times a day 200 each 12  . Lancet Device MISC 1 Units by Does not apply route daily as needed. 100 each 11  . Lancets (ONETOUCH DELICA PLUS 123XX123) MISC USE TO CHECK BLOOD SUGAR 2-3 TIMES DAILY    . magnesium oxide (MAG-OX) 400 MG tablet Take 1 tablet by mouth daily.    . metFORMIN (GLUCOPHAGE) 1000 MG tablet TAKE 1 TABLET BY MOUTH TWO  TIMES DAILY WITH A MEAL 180 tablet 3  . nitroGLYCERIN (NITROSTAT) 0.4 MG SL tablet Place 1 tablet (0.4 mg total) under the tongue every 5 (five) minutes as needed for chest pain. 25 tablet 3  . OZEMPIC, 0.25 OR 0.5 MG/DOSE, 2 MG/1.5ML SOPN Inject 0.5 mg into the skin once a week. 3 pen 5  . prednisoLONE acetate (PRED FORTE) 1 % ophthalmic suspension 5 drops by Other route Two (2) times a day. Apply drops to both ears daily    . vitamin B-12 (CYANOCOBALAMIN) 1000 MCG tablet Take 500 mcg by mouth daily.     . midodrine (PROAMATINE) 10 MG tablet Take 1 tablet (10 mg total) by mouth 3 (three) times daily. 90 tablet 1   No current facility-administered medications for this visit.     Allergies:   Ace inhibitors and Neosporin [neomycin-bacitracin zn-polymyx]   Social History:  The patient  reports that he quit  smoking about 7 years ago. He smoked 1.00 pack per day. He has never used smokeless tobacco. He reports current alcohol use of about 8.0 - 14.0 standard drinks of alcohol per week. He reports that he does not use drugs.   Family History:   family history includes Arrhythmia (age of onset: 92) in his mother; Cancer in his father; Diabetes in his mother.    Review of Systems: Review of Systems  Constitutional: Negative.   Respiratory: Negative.   Cardiovascular: Negative.   Gastrointestinal: Negative.   Musculoskeletal: Negative.   Neurological: Positive for dizziness and loss of consciousness.  Psychiatric/Behavioral: Negative.   All other systems reviewed and are negative.   PHYSICAL EXAM: VS:  BP 122/66 (BP Location: Left  Arm, Patient Position: Sitting, Cuff Size: Normal)   Pulse (!) 118   Ht 6' (1.829 m)   Wt 152 lb (68.9 kg)   SpO2 97%   BMI 20.61 kg/m  , BMI Body mass index is 20.61 kg/m.  Constitutional:  oriented to person, place, and time.very thin HENT:  Head: Grossly normal Eyes:  no discharge. No scleral icterus.  Neck: No JVD, no carotid bruits  Cardiovascular: Regular rate and rhythm, no murmurs appreciated Pulmonary/Chest: Clear to auscultation bilaterally, no wheezes or rails Abdominal: Soft.  no distension.  no tenderness.  Musculoskeletal: Normal range of motion Neurological:  normal muscle tone. Coordination normal. No atrophy Skin: Skin warm and dry Psychiatric: normal affect, pleasant  Recent Labs: 02/18/2019: TSH 2.750 05/14/2019: ALT 11 06/18/2019: BUN 13; Creatinine, Ser 0.76; Hemoglobin 13.4; Platelets 237; Potassium 4.3; Sodium 140    Lipid Panel Lab Results  Component Value Date   CHOL 126 05/14/2019   HDL 70 02/18/2019   LDLCALC 38 02/18/2019   TRIG 113 05/14/2019      Wt Readings from Last 3 Encounters:  06/25/19 152 lb (68.9 kg)  06/25/19 150 lb (68 kg)  05/12/19 160 lb (72.6 kg)     ASSESSMENT AND PLAN:  Type 2 diabetes  mellitus without complication, with long-term current use of insulin ([place]) Managed by primary care,  A1c of 6 Weight down 20 pounds in the past 6 months Recommend he try to stabilize his weight which is likely causing hypotension  Syncope and collapse -  Denies chest pain or shortness of breath Recent mechanical fall in January Syncope in February 2021 likely secondary to hypotension/orthostasis -20 pound weight loss over the past 6 months contributing -We will start midodrine 5 up to 10 mg 3 times daily for hypotension Stressed importance of weight gain  Orthostatic hypotension with syncope Off all blood pressure medication Stressed importance of aggressive fluids, compression hose, back brace, Midodrine  5 mg up to 10 mg to support systolic pressure of [id number] or greater with standing Currently running [id number] systolic despite aggressive fluids  CAD, Stable angina cardiac catheterization April 2019   stent to the proximal RCA Denies anginal symptoms  High cholesterol -  Cholesterol is at goal on the current lipid regimen. No changes to the medications were made.  Hyperkalemia avoiding ACE inhibitors ARB Recent potassium 4.3  Long discussion today concerning recent events, 2 hospitalizations reviewed, Hypotension, dramatic weight loss Differential of his syncope  Total encounter time more than 45 minutes  Greater than 50% was spent in counseling and coordination of care with the patient    Orders Placed This Encounter  Procedures  . EKG 12-Lead     Signed, Esmond Plants, M.D., Ph.D. 06/25/2019  [place], [place]

## 2019-06-25 NOTE — Assessment & Plan Note (Signed)
Recheck mag level outpatient and continue supplement.

## 2019-06-25 NOTE — Assessment & Plan Note (Signed)
New onset over past months with elevation in HR.  Continue to monitor BP closely at home.  Ensure increase hydration and meal intake daily.  Wear compression hose daily, on during day and off at night.  Obtain outpatient labs to include CBC, CMP, anemia panel, TSH, Mag level.  Return in one week or sooner if worsening.  He if following up with cardiology today.

## 2019-06-26 LAB — CBC WITH DIFFERENTIAL/PLATELET
Basophils Absolute: 0.1 10*3/uL (ref 0.0–0.2)
Basos: 1 %
EOS (ABSOLUTE): 0.2 10*3/uL (ref 0.0–0.4)
Eos: 2 %
Hemoglobin: 13.7 g/dL (ref 13.0–17.7)
Immature Grans (Abs): 0 10*3/uL (ref 0.0–0.1)
Immature Granulocytes: 0 %
Lymphocytes Absolute: 1.5 10*3/uL (ref 0.7–3.1)
Lymphs: 15 %
MCH: 31.7 pg (ref 26.6–33.0)
MCHC: 33.8 g/dL (ref 31.5–35.7)
MCV: 94 fL (ref 79–97)
Monocytes Absolute: 0.6 10*3/uL (ref 0.1–0.9)
Monocytes: 6 %
Neutrophils Absolute: 7.9 10*3/uL — ABNORMAL HIGH (ref 1.4–7.0)
Neutrophils: 76 %
Platelets: 278 10*3/uL (ref 150–450)
RBC: 4.32 x10E6/uL (ref 4.14–5.80)
RDW: 13.5 % (ref 11.6–15.4)
WBC: 10.3 10*3/uL (ref 3.4–10.8)

## 2019-06-26 LAB — COMPREHENSIVE METABOLIC PANEL
ALT: 11 IU/L (ref 0–44)
AST: 19 IU/L (ref 0–40)
Albumin/Globulin Ratio: 2.3 — ABNORMAL HIGH (ref 1.2–2.2)
Albumin: 5.1 g/dL — ABNORMAL HIGH (ref 3.7–4.7)
Alkaline Phosphatase: 137 IU/L — ABNORMAL HIGH (ref 39–117)
BUN/Creatinine Ratio: 18 (ref 10–24)
BUN: 16 mg/dL (ref 8–27)
Bilirubin Total: 0.6 mg/dL (ref 0.0–1.2)
CO2: 22 mmol/L (ref 20–29)
Calcium: 11 mg/dL — ABNORMAL HIGH (ref 8.6–10.2)
Chloride: 95 mmol/L — ABNORMAL LOW (ref 96–106)
Creatinine, Ser: 0.9 mg/dL (ref 0.76–1.27)
GFR calc Af Amer: 99 mL/min/{1.73_m2} (ref >59)
GFR calc non Af Amer: 86 mL/min/{1.73_m2} (ref >59)
Globulin, Total: 2.2 g/dL (ref 1.5–4.5)
Glucose: 122 mg/dL — ABNORMAL HIGH (ref 65–99)
Potassium: 5.4 mmol/L — ABNORMAL HIGH (ref 3.5–5.2)
Sodium: 139 mmol/L (ref 134–144)
Total Protein: 7.3 g/dL (ref 6.0–8.5)

## 2019-06-26 LAB — ANEMIA PANEL
Ferritin: 106 ng/mL (ref 30–400)
Folate, Hemolysate: 365 ng/mL
Folate, RBC: 901 ng/mL (ref >498)
Hematocrit: 40.5 % (ref 37.5–51.0)
Iron Saturation: 19 % (ref 15–55)
Iron: 66 ug/dL (ref 38–169)
Retic Ct Pct: 2.3 % (ref 0.6–2.6)
Total Iron Binding Capacity: 356 ug/dL (ref 250–450)
UIBC: 290 ug/dL (ref 111–343)
Vitamin B-12: 1921 pg/mL — ABNORMAL HIGH (ref 232–1245)

## 2019-06-26 LAB — TSH: TSH: 2.94 u[IU]/mL (ref 0.450–4.500)

## 2019-06-26 LAB — MAGNESIUM: Magnesium: 1.8 mg/dL (ref 1.6–2.3)

## 2019-06-26 NOTE — Progress Notes (Signed)
Spoke to patient on telephone and discussed labs.  Will add on PTH, discussed with him.  Anemia improved.

## 2019-06-30 DIAGNOSIS — S0240ED Zygomatic fracture, right side, subsequent encounter for fracture with routine healing: Secondary | ICD-10-CM | POA: Diagnosis not present

## 2019-07-01 ENCOUNTER — Other Ambulatory Visit: Payer: Self-pay | Admitting: Nurse Practitioner

## 2019-07-01 ENCOUNTER — Encounter: Payer: Self-pay | Admitting: Nurse Practitioner

## 2019-07-01 DIAGNOSIS — E349 Endocrine disorder, unspecified: Secondary | ICD-10-CM

## 2019-07-01 LAB — PTH, INTACT AND CALCIUM: Calcium: 11 mg/dL — ABNORMAL HIGH (ref 8.6–10.2)

## 2019-07-01 LAB — SPECIMEN STATUS REPORT

## 2019-07-02 ENCOUNTER — Other Ambulatory Visit: Payer: PPO

## 2019-07-02 ENCOUNTER — Other Ambulatory Visit: Payer: Self-pay

## 2019-07-02 DIAGNOSIS — D649 Anemia, unspecified: Secondary | ICD-10-CM | POA: Diagnosis not present

## 2019-07-02 DIAGNOSIS — R55 Syncope and collapse: Secondary | ICD-10-CM | POA: Diagnosis not present

## 2019-07-03 ENCOUNTER — Ambulatory Visit (INDEPENDENT_AMBULATORY_CARE_PROVIDER_SITE_OTHER): Payer: PPO | Admitting: Nurse Practitioner

## 2019-07-03 ENCOUNTER — Encounter: Payer: Self-pay | Admitting: Nurse Practitioner

## 2019-07-03 VITALS — BP 120/74 | HR 94 | Temp 97.7°F | Wt 149.8 lb

## 2019-07-03 DIAGNOSIS — R634 Abnormal weight loss: Secondary | ICD-10-CM | POA: Insufficient documentation

## 2019-07-03 DIAGNOSIS — R55 Syncope and collapse: Secondary | ICD-10-CM

## 2019-07-03 LAB — FECAL OCCULT BLOOD, IMMUNOCHEMICAL: Fecal Occult Bld: NEGATIVE

## 2019-07-03 LAB — PTH, INTACT AND CALCIUM
Calcium: 11 mg/dL — ABNORMAL HIGH (ref 8.6–10.2)
PTH: 42 pg/mL (ref 15–65)

## 2019-07-03 NOTE — Assessment & Plan Note (Addendum)
40 pounds in past year and 3 pounds in past week with decreased appetite since starting Jardiance.  Will trial a period of Jardiance over weekend, until he speaks with CCM PharmD on 07/07/2019 -- assess if any increase in appetite without Jardiance.  May also need to consider return to insulin and discontinuation of Ozempic over time if ongoing loss.  Will obtain abdominal CT to assess for any abnormal findings.  Consider CT lung if ongoing loss and poor appetite, is former smoker.  Return as scheduled in April.

## 2019-07-03 NOTE — Patient Instructions (Signed)

## 2019-07-03 NOTE — Assessment & Plan Note (Signed)
Episodes appear to have improved at this time with Midodrine.  Continue to monitor BP at home daily and notify cardiology or PCP if low readings or syncopal episodes present.  Will trial hold on Jardiance over weekend and until he sees CCM PharmD next week, to assess whether any correlation with medication and recent BP changes and syncopal episodes + weight loss.  Return as scheduled in April.  Sooner if increased or worsening episodes.

## 2019-07-03 NOTE — Progress Notes (Signed)
Contacted via MyChart

## 2019-07-03 NOTE — Progress Notes (Signed)
BP 120/74 (BP Location: Right Arm, Patient Position: Sitting, Cuff Size: Normal)   Pulse 94   Temp 97.7 F (36.5 C) (Oral)   Wt 149 lb 12.8 oz (67.9 kg)   SpO2 96%   BMI 20.32 kg/m    Subjective:    Patient ID: Johnny Wolfe, male    DOB: 07-Sep-1945, 74 y.o.   MRN: [id number]  HPI: Johnny Wolfe is a 74 y.o. male  Chief Complaint  Patient presents with  . Follow-up    syncope    DIZZINESS Had fall on 05/12/2019 resulting in closed fracture of right mandible for which he is being followed at [facility] by ENT and plastic surgery.  Was seen in ER on 06/18/2019 and 06/19/2019 for syncope with fall.  April 2019 -- EF 55%.  Saw Dr. Rockey Situ on 06/25/2019 and was started on Midodrine for orthostatic BP levels.  No further syncope since initiation.  Decreased appetite since starting Jardiance, at that time he also reduced and discontinued insulin.  The 4-5 days he was in hospital recently he was not on Jardiance and his appetite returned, but when he came home and started back on it then appetite decreased again.  Weight was 174 lbs in April 2019 and January 187 lbs in 2020, these are while he was still on insulin.  Non smoker, at this time but was former smoker wuit in 2013.  Duration: months Description of symptoms: lightheaded Duration of episode: seconds Dizziness frequency: no history of the same Nausea: yes -- nausea when thinks about eating Vomiting: no Tinnitus: no Hearing loss: no Aural fullness: no Headache: no Photophobia/phonophobia: no Unsteady gait: no Postural instability: no Diplopia, dysarthria, dysphagia or weakness: no Related to exertion: no Pallor: no Diaphoresis: no Dyspnea: occasional over past 10 years Chest pain: no  Relevant past medical, surgical, family and social history reviewed and updated as indicated. Interim medical history since our last visit reviewed. Allergies and medications reviewed and updated.  Review of Systems  Constitutional: Positive for  appetite change and unexpected weight change. Negative for activity change, diaphoresis, fatigue and fever.  Respiratory: Negative for cough, chest tightness, shortness of breath and wheezing.   Cardiovascular: Negative for chest pain, palpitations and leg swelling.  Gastrointestinal: Negative for abdominal distention, abdominal pain, blood in stool, constipation, diarrhea, nausea and vomiting.  Neurological: Positive for syncope (improved at this time). Negative for dizziness, weakness, light-headedness, numbness and headaches.  Psychiatric/Behavioral: Negative.     Per HPI unless specifically indicated above     Objective:    BP 120/74 (BP Location: Right Arm, Patient Position: Sitting, Cuff Size: Normal)   Pulse 94   Temp 97.7 F (36.5 C) (Oral)   Wt 149 lb 12.8 oz (67.9 kg)   SpO2 96%   BMI 20.32 kg/m   Wt Readings from Last 3 Encounters:  07/03/19 149 lb 12.8 oz (67.9 kg)  06/25/19 152 lb (68.9 kg)  06/25/19 150 lb (68 kg)    Physical Exam Vitals and nursing note reviewed.  Constitutional:      General: He is awake. He is not in acute distress.    Appearance: He is well-developed, well-groomed and underweight. He is not ill-appearing.  HENT:     Head: Normocephalic and atraumatic.     Right Ear: Hearing normal. No drainage.     Left Ear: Hearing normal. No drainage.     Mouth/Throat:     Pharynx: Uvula midline.  Eyes:     General: Lids are normal.  Right eye: No discharge.        Left eye: No discharge.     Conjunctiva/sclera: Conjunctivae normal.     Pupils: Pupils are equal, round, and reactive to light.  Neck:     Thyroid: No thyromegaly.     Vascular: No carotid bruit.  Cardiovascular:     Rate and Rhythm: Normal rate and regular rhythm.     Heart sounds: Normal heart sounds, S1 normal and S2 normal. No murmur. No gallop.   Pulmonary:     Effort: Pulmonary effort is normal. No accessory muscle usage.     Breath sounds: Normal breath sounds.    Abdominal:     General: Bowel sounds are normal.     Palpations: Abdomen is soft. There is no hepatomegaly or splenomegaly.     Tenderness: There is no abdominal tenderness.     Hernia: No hernia is present.  Musculoskeletal:        General: Normal range of motion.     Cervical back: Normal range of motion and neck supple.     Right lower leg: No edema.     Left lower leg: No edema.  Lymphadenopathy:     Cervical: No cervical adenopathy.  Skin:    General: Skin is warm and dry.     Capillary Refill: Capillary refill takes less than 2 seconds.  Neurological:     Mental Status: He is alert and oriented to person, place, and time.     Deep Tendon Reflexes: Reflexes are normal and symmetric.     Reflex Scores:      Brachioradialis reflexes are 2+ on the right side and 2+ on the left side.      Patellar reflexes are 2+ on the right side and 2+ on the left side. Psychiatric:        Attention and Perception: Attention normal.        Mood and Affect: Mood normal.        Speech: Speech normal.        Behavior: Behavior normal. Behavior is cooperative.        Thought Content: Thought content normal.    Results for orders placed or performed in visit on 07/02/19  PTH, Intact and Calcium  Result Value Ref Range   Calcium 11.0 (H) 8.6 - 10.2 mg/dL   PTH 42 15 - 65 pg/mL   PTH Interp Comment       Assessment & Plan:   Problem List Items Addressed This Visit      Cardiovascular and Mediastinum   Syncope - Primary    Episodes appear to have improved at this time with Midodrine.  Continue to monitor BP at home daily and notify cardiology or PCP if low readings or syncopal episodes present.  Will trial hold on Jardiance over weekend and until he sees CCM PharmD next week, to assess whether any correlation with medication and recent BP changes and syncopal episodes + weight loss.  Return as scheduled in April.  Sooner if increased or worsening episodes.        Other   Abnormal weight  loss    40 pounds in past year and 3 pounds in past week with decreased appetite since starting Jardiance.  Will trial a period of Jardiance over weekend, until he speaks with CCM PharmD on 07/07/2019 -- assess if any increase in appetite without Jardiance.  May also need to consider return to insulin and discontinuation of Ozempic over time if ongoing loss.  Will obtain abdominal CT to assess for any abnormal findings.  Consider CT lung if ongoing loss and poor appetite, is former smoker.  Return as scheduled in April.      Relevant Orders   CT Abdomen Pelvis W Contrast       Follow up plan: Return for as scheduled in April.

## 2019-07-07 ENCOUNTER — Ambulatory Visit (INDEPENDENT_AMBULATORY_CARE_PROVIDER_SITE_OTHER): Payer: PPO | Admitting: Pharmacist

## 2019-07-07 ENCOUNTER — Other Ambulatory Visit: Payer: Self-pay | Admitting: Nurse Practitioner

## 2019-07-07 DIAGNOSIS — E119 Type 2 diabetes mellitus without complications: Secondary | ICD-10-CM

## 2019-07-07 DIAGNOSIS — E1159 Type 2 diabetes mellitus with other circulatory complications: Secondary | ICD-10-CM | POA: Diagnosis not present

## 2019-07-07 DIAGNOSIS — I10 Essential (primary) hypertension: Secondary | ICD-10-CM | POA: Diagnosis not present

## 2019-07-07 DIAGNOSIS — Z794 Long term (current) use of insulin: Secondary | ICD-10-CM | POA: Diagnosis not present

## 2019-07-07 NOTE — Chronic Care Management (AMB) (Signed)
  Chronic Care Management   Note  07/07/2019 Name: Johnny Wolfe MRN: [id number] DOB: 06/06/1945  Johnny Wolfe is a 74 y.o. year old male who is a primary care patient of Cannady, Barbaraann Faster, NP. The CCM team was consulted for assistance with chronic disease management and care coordination needs.    Contacted patient. Informed that PCP was in agreement with c-peptide and GAD autoantibodies, and a trial of holding Ozempic and Jardiance. Will encourage patient to stop both, and call me or MyChart PCP with BG readings in ~2 weeks, sooner if significant elevations. Patient in agreement and will schedule outpatient labs.   Catie Darnelle Maffucci, PharmD, [place] [phone]

## 2019-07-07 NOTE — Chronic Care Management (AMB) (Signed)
Chronic Care Management   Follow Up Note   07/07/2019 Name: Johnny Wolfe MRN: [id number] DOB: 07-27-1945  Referred by: Venita Lick, NP Reason for referral : Chronic Care Management (Medication Management)   Johnny Wolfe is a 74 y.o. year old male who is a primary care patient of Cannady, Barbaraann Faster, NP. The CCM team was consulted for assistance with chronic disease management and care coordination needs.    Contacted patient for medication management review.   Review of patient status, including review of consultants reports, relevant laboratory and other test results, and collaboration with appropriate care team members and the patient's provider was performed as part of comprehensive patient evaluation and provision of chronic care management services.    SDOH (Social Determinants of Health) assessments performed: No See Care Plan activities for detailed interventions related to [facility])     Outpatient Encounter Medications as of 07/07/2019  Medication Sig  . metFORMIN (GLUCOPHAGE) 1000 MG tablet TAKE 1 TABLET BY MOUTH TWO  TIMES DAILY WITH A MEAL  . OZEMPIC, 0.25 OR 0.5 MG/DOSE, 2 MG/1.5ML SOPN Inject 0.5 mg into the skin once a week.  Marland Kitchen ascorbic acid (VITAMIN C) 500 MG tablet Take 500 mg by mouth daily.  Marland Kitchen aspirin EC 81 MG tablet Take 81 mg by mouth daily.  Marland Kitchen atorvastatin (LIPITOR) 40 MG tablet TAKE 1 TABLET BY MOUTH  DAILY AT 6 PM  . empagliflozin (JARDIANCE) 25 MG TABS tablet Take 25 mg by mouth daily before breakfast. (Patient not taking: Reported on 07/07/2019)  . glucose blood (ONE TOUCH ULTRA TEST) test strip Check blood 2-3 times a day  . Lancet Device MISC 1 Units by Does not apply route daily as needed.  . Lancets (ONETOUCH DELICA PLUS 123XX123) MISC USE TO CHECK BLOOD SUGAR 2-3 TIMES DAILY  . magnesium oxide (MAG-OX) 400 MG tablet Take 1 tablet by mouth daily.  . midodrine (PROAMATINE) 10 MG tablet Take 1 tablet (10 mg total) by mouth 3 (three) times daily.  .  nitroGLYCERIN (NITROSTAT) 0.4 MG SL tablet Place 1 tablet (0.4 mg total) under the tongue every 5 (five) minutes as needed for chest pain.  . vitamin B-12 (CYANOCOBALAMIN) 1000 MCG tablet Take 500 mcg by mouth daily.    No facility-administered encounter medications on file as of 07/07/2019.     Objective:   Goals Addressed            This Visit's Progress     Patient Stated   . PharmD "I want to stay healthy" (pt-stated)       CARE PLAN ENTRY (see longtitudinal plan of care for additional care plan information)  Current Barriers:  . Diabetes: controlled; most recent A1c 6.3%  . Recent issue w/ syncopal episodes, lack of appetite, and significant weight loss over the past few months. Does appear to be a component of orthostasis.  . Denies significant change in vitals or sugars since stopping Jardiance a few days ago. Continues to endorse lack of appetite. Per his review today, he does note that weight loss has been more significant since starting Ozempic.  Marland Kitchen Notes little impact on BG when Jardiance or Ozempic were added or titrated . Current antihyperglycemic regimen: metformin 1000 mg BID, Jardiance 25 mg daily- has been holding for the past few days, Ozempic 0.5 mg  o Notes hx Glyxambi (metformin/sitagliptin), pioglitazone (d/c d/t cardiac disease hx) . Current blood glucose readings:   o High 130-150s; doesn't think there was much benefit w/ Jardiance or Ozempic  dose increases;  . Cardiovascular risk reduction (hx CAD s/p stent to RCA in 07/2017) o Current hypertensive regimen: metoprolol tartrate 12.5 mg BID- HELD d/t orthostatic hypotension - Visit w/ Dr Rockey Situ, discussed potential for V[place] in the future  o Current hyperlipidemia regimen: atorvastatin 40 mg daily; LDL at goal <70 o Antiplatelet regimen: ASA 81 mg (completed 1 year of DAPT w/ clopidogrel)  Pharmacist Clinical Goal(s):  Marland Kitchen Over the next 90 days, patient will work with PharmD and primary care provider to address  optimized medication management  Interventions: . Given lack of benefit w/ SGLT2 or GLP1, and patient's physical characteristics (lack of being overweight), and/or longer duration of diabetes, may suspect a more T1.5 picture. May consider checking c peptide/GAD autoantibodies to determine. If C peptide negligible, we would know that SGLT2/GLP1 unlikely to benefit, and conservative doses of basal insulin to minimize hypoglycemia would be most appropriate. Discussed this possibility with patient, he notes that he wouldn't mind paying for the test if insurance doesn't cover.  . Additionally, worry that diuretic effects of SGLT2 and weight loss effects of GLP1 may be contributing to current symptoms. Also believe it appropriate to try a trial off of both SGLT2 and GLP1. Patient reports he has a supply of Lantus at home that could be restarted if needed. Will collaborate w/ PCP for next steps.   Patient Self Care Activities:  . Patient will check blood glucose daily , document, and provide at future appointments . Patient will take medications as prescribed . Patient will report any questions or concerns to provider   Please see past updates related to this goal by clicking on the "Past Updates" button in the selected goal          Plan:  - Will collaborate w/ PCP as above  Courtney Heys, PharmD, [place] [phone]

## 2019-07-07 NOTE — Patient Instructions (Signed)
Visit Information  Goals Addressed            This Visit's Progress     Patient Stated   . PharmD "I want to stay healthy" (pt-stated)       CARE PLAN ENTRY (see longtitudinal plan of care for additional care plan information)  Current Barriers:  . Diabetes: controlled; most recent A1c 6.3%  . Recent issue w/ syncopal episodes, lack of appetite, and significant weight loss over the past few months. Does appear to be a component of orthostasis.  . Denies significant change in vitals or sugars since stopping Jardiance a few days ago. Continues to endorse lack of appetite. Per his review today, he does note that weight loss has been more significant since starting Ozempic.  Marland Kitchen Notes little impact on BG when Jardiance or Ozempic were added or titrated . Current antihyperglycemic regimen: metformin 1000 mg BID, Jardiance 25 mg daily- has been holding for the past few days, Ozempic 0.5 mg  o Notes hx Glyxambi (metformin/sitagliptin), pioglitazone (d/c d/t cardiac disease hx) . Current blood glucose readings:   o High 130-150s; doesn't think there was much benefit w/ Jardiance or Ozempic dose increases;  . Cardiovascular risk reduction (hx CAD s/p stent to RCA in 07/2017) o Current hypertensive regimen: metoprolol tartrate 12.5 mg BID- HELD d/t orthostatic hypotension - Visit w/ Dr Rockey Situ, discussed potential for V[place] in the future  o Current hyperlipidemia regimen: atorvastatin 40 mg daily; LDL at goal <70 o Antiplatelet regimen: ASA 81 mg (completed 1 year of DAPT w/ clopidogrel)  Pharmacist Clinical Goal(s):  Marland Kitchen Over the next 90 days, patient will work with PharmD and primary care provider to address optimized medication management  Interventions: . Given lack of benefit w/ SGLT2 or GLP1, and patient's physical characteristics (lack of being overweight), and/or longer duration of diabetes, may suspect a more T1.5 picture. May consider checking c peptide/GAD autoantibodies to determine.  If C peptide negligible, we would know that SGLT2/GLP1 unlikely to benefit, and conservative doses of basal insulin to minimize hypoglycemia would be most appropriate. Discussed this possibility with patient, he notes that he wouldn't mind paying for the test if insurance doesn't cover.  . Additionally, worry that diuretic effects of SGLT2 and weight loss effects of GLP1 may be contributing to current symptoms. Also believe it appropriate to try a trial off of both SGLT2 and GLP1. Patient reports he has a supply of Lantus at home that could be restarted if needed. Will collaborate w/ PCP for next steps.   Patient Self Care Activities:  . Patient will check blood glucose daily , document, and provide at future appointments . Patient will take medications as prescribed . Patient will report any questions or concerns to provider   Please see past updates related to this goal by clicking on the "Past Updates" button in the selected goal         Patient verbalizes understanding of instructions provided today.   Plan:  - Will collaborate w/ PCP as above  Courtney Heys, PharmD, [facility] [phone]

## 2019-07-08 ENCOUNTER — Other Ambulatory Visit: Payer: Self-pay

## 2019-07-08 ENCOUNTER — Other Ambulatory Visit: Payer: PPO

## 2019-07-08 DIAGNOSIS — E119 Type 2 diabetes mellitus without complications: Secondary | ICD-10-CM | POA: Diagnosis not present

## 2019-07-08 DIAGNOSIS — Z794 Long term (current) use of insulin: Secondary | ICD-10-CM

## 2019-07-11 ENCOUNTER — Encounter: Payer: Self-pay | Admitting: Nurse Practitioner

## 2019-07-11 LAB — C-PEPTIDE: C-Peptide: 2.1 ng/mL (ref 1.1–4.4)

## 2019-07-11 LAB — GLUTAMIC ACID DECARBOXYLASE AUTO ABS: Glutamic Acid Decarb Ab: <5 U/mL (ref 0.0–5.0)

## 2019-07-11 NOTE — Progress Notes (Signed)
Contacted via MyChart

## 2019-07-17 ENCOUNTER — Other Ambulatory Visit: Payer: Self-pay

## 2019-07-17 ENCOUNTER — Ambulatory Visit
Admission: RE | Admit: 2019-07-17 | Discharge: 2019-07-17 | Disposition: A | Discharge status: Home / Self Care | Payer: PPO | Source: Ambulatory Visit | Attending: Nurse Practitioner | Admitting: Nurse Practitioner

## 2019-07-17 ENCOUNTER — Encounter: Payer: Self-pay | Admitting: Nurse Practitioner

## 2019-07-17 DIAGNOSIS — I700 Atherosclerosis of aorta: Secondary | ICD-10-CM | POA: Insufficient documentation

## 2019-07-17 DIAGNOSIS — K8020 Calculus of gallbladder without cholecystitis without obstruction: Secondary | ICD-10-CM | POA: Diagnosis not present

## 2019-07-17 DIAGNOSIS — R634 Abnormal weight loss: Secondary | ICD-10-CM

## 2019-07-17 MED ORDER — IOHEXOL 300 MG/ML  SOLN
100.0000 mL | Freq: Once | INTRAMUSCULAR | Status: AC | PRN
  Administered 2019-07-17: 100 mL via INTRAVENOUS

## 2019-07-17 NOTE — Progress Notes (Signed)
Contacted via MyChart

## 2019-07-21 ENCOUNTER — Ambulatory Visit: Payer: PPO | Admitting: Cardiovascular Disease

## 2019-07-28 ENCOUNTER — Ambulatory Visit (INDEPENDENT_AMBULATORY_CARE_PROVIDER_SITE_OTHER): Payer: PPO | Admitting: Family

## 2019-07-28 ENCOUNTER — Other Ambulatory Visit: Payer: Self-pay

## 2019-07-28 ENCOUNTER — Encounter: Payer: Self-pay | Admitting: Family

## 2019-07-28 VITALS — BP 130/78 | HR 103 | Ht 72.0 in | Wt 156.0 lb

## 2019-07-28 DIAGNOSIS — E119 Type 2 diabetes mellitus without complications: Secondary | ICD-10-CM

## 2019-07-28 DIAGNOSIS — Z794 Long term (current) use of insulin: Secondary | ICD-10-CM | POA: Diagnosis not present

## 2019-07-28 DIAGNOSIS — I951 Orthostatic hypotension: Secondary | ICD-10-CM | POA: Diagnosis not present

## 2019-07-28 DIAGNOSIS — I25118 Atherosclerotic heart disease of native coronary artery with other forms of angina pectoris: Secondary | ICD-10-CM

## 2019-07-28 DIAGNOSIS — R55 Syncope and collapse: Secondary | ICD-10-CM

## 2019-07-28 MED ORDER — MIDODRINE HCL 10 MG PO TABS: 10.0000 mg | ORAL_TABLET | ORAL | 1 refills | Status: DC | PRN

## 2019-07-28 MED ORDER — METOPROLOL TARTRATE 25 MG PO TABS: 12.5000 mg | ORAL_TABLET | Freq: Two times a day (BID) | ORAL | 0 refills | Status: DC

## 2019-07-28 NOTE — Progress Notes (Signed)
Office Visit    Patient Name: Johnny Wolfe Date of Encounter: 07/28/2019  Primary Care Provider:  Venita Lick, NP Primary Cardiologist:  Ida Rogue, MD Electrophysiologist:  None   Chief Complaint    Johnny Wolfe is a 74 y.o. male with a hx of DM2, HLD, previous tobacco abuse, CAD s/p DES to RCA, orthostatic hypotension and syncope presents today for follow up of orthostatic hypotension   Past Medical History    Past Medical History:  Diagnosis Date  . CAD (coronary artery disease)    a. 06/2017 Cardiac CT: Ca2+ score of 2310 - 95th%'ile;  b. 07/2017 Cath/PCI: LM nl, LAD 60p, 66m, LCX 60p, OM2 40p, RCA dominant, 80p (3.5x15 [place] DES), 51m, 30d, EF 55-65%.  . Cataract   . Diabetes mellitus without complication ([place])   . Essential hypertension   . Hyperlipidemia   . Syncope   . Wears dentures    full upper and lower   Past Surgical History:  Procedure Laterality Date  . APPENDECTOMY    . CATARACT EXTRACTION, BILATERAL     August 2017  . CORONARY STENT INTERVENTION N/A 08/02/2017   Procedure: CORONARY STENT INTERVENTION;  Surgeon: Wellington Hampshire, MD;  Location: [place] [facility];  Service: Cardiovascular;  Laterality: N/A;  . LEFT HEART CATH AND CORONARY ANGIOGRAPHY Left 08/02/2017   Procedure: LEFT HEART CATH AND CORONARY ANGIOGRAPHY;  Surgeon: Minna Merritts, MD;  Location: [place] [facility];  Service: Cardiovascular;  Laterality: Left;  Marland Kitchen MASS EXCISION Left 07/04/2017   Procedure: REMOVAL TWO CYST EAR EAR LOBE AND EXTERNAL EAR;  Surgeon: Beverly Gust, MD;  Location: [place];  Service: ENT;  Laterality: Left;  LOCAL Diabetic - insulin and oral meds    Allergies  Allergies  Allergen Reactions  . Ace Inhibitors     Hyperkalemia  . Neosporin [Neomycin-Bacitracin Zn-Polymyx]     Red rash     History of Present Illness    Johnny Wolfe is a 74 y.o. male with a hx of  DM2, HLD, previous tobacco abuse, CAD s/p DES to RCA, orthostatic  hypotension and syncope last seen 06/25/19 by Dr. Rockey Situ.  Cardiac cath 08/02/17 with severe prox RCA disease with stent placement. Moderate prox LAD and LCx. His EF was estimated at 55%.   He had another syncopal episode 05/21/17 that he does not recall details of.   He was seen in the ED 05/12/19 after a mechanical fall after tripping over rug and facial fracture requiring ORIF by Dr. Mardee Postin with ENT at [facility]. He had lost a significant amount of weight, about 20 pounds, after changes to his diabetic regimen. Echo done at [facility] during admission with LVEF >55% and no significant valvular abnormalities.   He had orthostatic vitals at home and was subsequently started on Midodrine. His primary care provider discontinued his Metoprolol.  Tells me he stopped his midodrine about a month ago. Tells me after being better hydrated and eating more regularly - he would check his blood pressure and it would only drop 10-15 points. His blood pressure has been better with systolics in the 0000000.   Weight up 4 lbs since last office visit. HR routinely in the 90s. HR on Apple Watch at rest was 85 bpm.  Lost 20 lbs over a very short period.    EKGs/Labs/Other Studies Reviewed:   The following studies were reviewed today:  EKG:  EKG is ordered today.  The ekg ordered today demonstrates ST rate 103  bpm with RBBB and no acute ST/T wave changes.   Recent Labs: 06/25/2019: ALT 11; BUN 16; Creatinine, Ser 0.90; Hemoglobin 13.7; Magnesium 1.8; Platelets 278; Potassium 5.4; Sodium 139; TSH 2.940  Recent Lipid Panel    Component Value Date/Time   CHOL 126 05/14/2019 0817   TRIG 113 05/14/2019 0817   HDL 70 02/18/2019 1316   VLDL 23 05/14/2019 0817   LDLCALC 38 02/18/2019 1316    Home Medications   No outpatient medications have been marked as taking for the 07/28/19 encounter (Appointment) with Loel Dubonnet, NP.    Review of Systems    Review of Systems  Constitution: Negative for chills, fever and  malaise/fatigue.  Cardiovascular: Negative for chest pain, dyspnea on exertion, leg swelling, near-syncope, orthopnea, palpitations and syncope.  Respiratory: Negative for cough, shortness of breath and wheezing.   Gastrointestinal: Negative for nausea and vomiting.  Neurological: Negative for dizziness, light-headedness and weakness.   All other systems reviewed and are otherwise negative except as noted above.  Physical Exam    VS:  There were no vitals taken for this visit. , BMI There is no height or weight on file to calculate BMI. GEN: Well nourished, well developed, in no acute distress. HEENT: normal. Neck: Supple, no JVD, carotid bruits, or masses. Cardiac: RRR, tachycardic, no murmurs, rubs, or gallops. No clubbing, cyanosis, edema.  Radials/DP/PT 2+ and equal bilaterally.  Respiratory:  Respirations regular and unlabored, clear to auscultation bilaterally. GI: Soft, nontender, nondistended, BS + x 4. MS: No deformity or atrophy. Skin: Warm and dry, no rash. Neuro:  Strength and sensation are intact. Psych: Normal affect.  Assessment & Plan    1. CAD - Stable with no anginal symptoms. No indication for ischemic evaluation at this time. Continue GDMT aspirin, statin. Re-initiate beta blocker (low dose Metoprolol) for cardiovascular protection and sinus tachycardia.   2. Syncope and collapse/Orthostatic hypotension - Mechanical fall in January. Likely due to hypotension/orthostasis in setting of 20 lb weight loss. No recurrent syncope. No lightheadedness, dizziness. He has not been taking Midodrine. Reports his orthostasis has been improved and notes BP only dropping <10 points when standing. He is agreeable to use Midodrine 10mg  as needed for SBP <90. Encouraged regular hydration, electrolyte supplementation such as Gatorade, and small regular meals.   3. Sinus tachycardia - Resting HR 90s-110s. As his BP has been routinely 0000000 systolic will tentatively re-introduce low dose  Metoprolol tartrate 12.5mg  twice daily. He will only take if his SBP is >110.  4. DM2 - Follows with PCP.  5. HLD, LDL goal <70 - Continue Lipitor 40mg  daily.  6. Hyperkalemia - Avoid ACE and ARB due to history of hyperkalemia.   Disposition: Follow up in 3 month(s) with Dr. Wallie Renshaw, NP 07/28/2019, 1:16 PM

## 2019-07-28 NOTE — Patient Instructions (Addendum)
Medication Instructions:  Your physician has recommended you make the following change in your medication:   Start Metoprolol Tartrate 12.5mg  (half tablet) twice daily.  Check your blood pressure in the morning prior to taking this medication. If your blood pressure is less than 110 for the top number, would not take that day.   *If you need a refill on your cardiac medications before your next appointment, please call your pharmacy*  Lab Work: No lab work today.  If you have labs (blood work) drawn today and your tests are completely normal, you will receive your results only by: Marland Kitchen MyChart Message (if you have MyChart) OR . A paper copy in the mail If you have any lab test that is abnormal or we need to change your treatment, we will call you to review the results.  Testing/Procedures: Your EKG today showed sinus tachycardia - which is a regular heart rhythm which is just a little bit faster than normal.   Follow-Up: At [facility], you and your health needs are our priority.  As part of our continuing mission to provide you with exceptional heart care, we have created designated Provider Care Teams.  These Care Teams include your primary Cardiologist (physician) and Advanced Practice Providers (APPs -  Physician Assistants and Nurse Practitioners) who all work together to provide you with the care you need, when you need it.  We recommend signing up for the patient portal called "MyChart".  Sign up information is provided on this After Visit Summary.  MyChart is used to connect with patients for Virtual Visits (Telemedicine).  Patients are able to view lab/test results, encounter notes, upcoming appointments, etc.  Non-urgent messages can be sent to your provider as well.   To learn more about what you can do with MyChart, go to [email or web address].    Your next appointment:   3 month(s)  The format for your next appointment:   In Person  Provider:    You may see Ida Rogue, MD or one of the following Advanced Practice Providers on your designated Care Team:    Murray Hodgkins, NP  Christell Faith, PA-C  Marrianne Mood, PA-C  Other Instructions   If your blood pressure is less than 100 for the top number, please take a dose of Midodrine.    Continue to stay well hydrated. Recommend 64 to 78 ounces of water daily. May use Gatorade Zero or Powerade Zero to prevent dehydration especially when working outside in the heat.    Make sure you make position changes carefully and slowly.

## 2019-08-11 ENCOUNTER — Other Ambulatory Visit: Payer: Self-pay | Admitting: Unknown Physician Specialty

## 2019-08-17 ENCOUNTER — Ambulatory Visit (INDEPENDENT_AMBULATORY_CARE_PROVIDER_SITE_OTHER): Payer: Medicare Other | Admitting: Nurse Practitioner

## 2019-08-17 ENCOUNTER — Other Ambulatory Visit: Payer: Self-pay

## 2019-08-17 ENCOUNTER — Encounter: Payer: Self-pay | Admitting: Nurse Practitioner

## 2019-08-17 VITALS — BP 128/72 | HR 61 | Temp 97.5°F | Wt 155.2 lb

## 2019-08-17 DIAGNOSIS — E1159 Type 2 diabetes mellitus with other circulatory complications: Secondary | ICD-10-CM | POA: Diagnosis not present

## 2019-08-17 DIAGNOSIS — E785 Hyperlipidemia, unspecified: Secondary | ICD-10-CM

## 2019-08-17 DIAGNOSIS — E119 Type 2 diabetes mellitus without complications: Secondary | ICD-10-CM

## 2019-08-17 DIAGNOSIS — E1169 Type 2 diabetes mellitus with other specified complication: Secondary | ICD-10-CM

## 2019-08-17 DIAGNOSIS — Z794 Long term (current) use of insulin: Secondary | ICD-10-CM | POA: Diagnosis not present

## 2019-08-17 DIAGNOSIS — I10 Essential (primary) hypertension: Secondary | ICD-10-CM

## 2019-08-17 LAB — BAYER DCA HB A1C WAIVED: HB A1C (BAYER DCA - WAIVED): 6.2 % (ref <7.0)

## 2019-08-17 LAB — MICROALBUMIN, URINE WAIVED
Creatinine, Urine Waived: 100 mg/dL (ref 10–300)
Microalb, Ur Waived: 30 mg/L — ABNORMAL HIGH (ref 0–19)
Microalb/Creat Ratio: <30 mg/g (ref <30)

## 2019-08-17 MED ORDER — INSULIN GLARGINE 100 UNIT/ML SOLOSTAR PEN: 16.0000 [IU] | PEN_INJECTOR | Freq: Every day | SUBCUTANEOUS | 6 refills | Status: DC

## 2019-08-17 NOTE — Assessment & Plan Note (Signed)
Noted on past labs, taking daily supplement.  Will recheck today.

## 2019-08-17 NOTE — Patient Instructions (Signed)

## 2019-08-17 NOTE — Assessment & Plan Note (Signed)
Chronic, ongoing.  BP at goal today and on rare home readings.  Continue current medication regimen and collaboration with cardiology.  Adjust as needed.  Return in 3 months. BMP today.

## 2019-08-17 NOTE — Assessment & Plan Note (Signed)
Chronic, ongoing with A1C at goal = 6.2%. Stable with Lantus and Metformin use, will most likely need to maintain on long term Lantus as tolerates this well.  Continue to monitor BS at home and a post prandial check.  Urine ALB 30 and A:C<30 today.  Refills Lantus sent in.  Return in 3 months.

## 2019-08-17 NOTE — Assessment & Plan Note (Signed)
Chronic, ongoing.  Continue current medication regimen and adjust as needed.  Current LDL well controlled.

## 2019-08-17 NOTE — Progress Notes (Signed)
BP 128/72 (BP Location: Left Arm)   Pulse 61   Temp (!) 97.5 F (36.4 C) (Oral)   Wt 155 lb 4 oz (70.4 kg)   SpO2 96%   BMI 21.06 kg/m    Subjective:    Patient ID: Johnny Wolfe, male    DOB: [birth year], 74 y.o.   MRN: [id number]  HPI: Johnny Wolfe is a 74 y.o. male  Chief Complaint  Patient presents with  . Follow-up  . Diabetes  . Hypertension  . Hyperlipidemia   DIABETES Last A1C 6.3% in January. Continues on Metformin 1000 MG BID and Lantus 16 units.  Have tried GLP1 and SGLT, but this was not beneficial.  He lost weight with changes in medications and felt tired, feels better and is gaining weight back on insulin. Glutamic acid <5.0 and C peptide 2.1. Hypoglycemic episodes:no Polydipsia/polyuria:no Visual disturbance:no Chest pain:no Paresthesias:no Glucose Monitoring:yes Accucheck frequency: Daily Fasting glucose: 68 to 120 Post prandial: 130-140 Evening: Before meals: Taking Insulin?:no Long acting insulin: Short acting insulin: Blood Pressure Monitoring:daily Retinal Examination:Up to Date Foot Exam:Up to Date Pneumovax:Up to Date Influenza:Up to Date Aspirin:yes  HYPERTENSION / HYPERLIPIDEMIA Continues on ASA and Metoprolol + Lipitor. Last saw cardiology 07/28/19. Satisfied with current treatment? yes Duration of hypertension: chronic BP monitoring frequency: has not checked for 2-3 weeks BP range: 120-130/60-70 BP medication side effects: no Duration of hyperlipidemia: chronic Cholesterol medication side effects: no Cholesterol supplements: none Medication compliance: good compliance Aspirin: yes Recent stressors: no Recurrent headaches: no Visual changes: no Palpitations: no Dyspnea: no Chest pain: no Lower extremity edema: no Dizzy/lightheaded: no  Relevant past medical, surgical, family and social history reviewed and updated as indicated.  Interim medical history since our last visit reviewed. Allergies and medications reviewed and updated.  Review of Systems  Constitutional: Negative for activity change, diaphoresis, fatigue and fever.  Respiratory: Negative for cough, chest tightness, shortness of breath and wheezing.   Cardiovascular: Negative for chest pain, palpitations and leg swelling.  Gastrointestinal: Negative.   Endocrine: Negative for polydipsia, polyphagia and polyuria.  Neurological: Negative.   Psychiatric/Behavioral: Negative.     Per HPI unless specifically indicated above     Objective:    BP 128/72 (BP Location: Left Arm)   Pulse 61   Temp (!) 97.5 F (36.4 C) (Oral)   Wt 155 lb 4 oz (70.4 kg)   SpO2 96%   BMI 21.06 kg/m   Wt Readings from Last 3 Encounters:  08/17/19 155 lb 4 oz (70.4 kg)  07/28/19 156 lb (70.8 kg)  07/03/19 149 lb 12.8 oz (67.9 kg)    Physical Exam Vitals and nursing note reviewed.  Constitutional:      General: He is awake. He is not in acute distress.    Appearance: He is well-developed and well-groomed. He is not ill-appearing.  HENT:     Head: Normocephalic and atraumatic.     Right Ear: Hearing normal. No drainage.     Left Ear: Hearing normal. No drainage.  Eyes:     General: Lids are normal.        Right eye: No discharge.        Left eye: No discharge.     Conjunctiva/sclera: Conjunctivae normal.     Pupils: Pupils are equal, round, and reactive to light.  Neck:     Vascular: No carotid bruit.  Cardiovascular:     Rate and Rhythm: Normal rate and regular rhythm.     Heart sounds: Normal heart  sounds, S1 normal and S2 normal. No murmur. No gallop.   Pulmonary:     Effort: Pulmonary effort is normal. No accessory muscle usage or respiratory distress.     Breath sounds: Normal breath sounds.  Abdominal:     General: Bowel sounds are normal.     Palpations: Abdomen is soft.  Musculoskeletal:        General: Normal range of motion.     Cervical back:  Normal range of motion and neck supple.     Right lower leg: No edema.     Left lower leg: No edema.  Skin:    General: Skin is warm and dry.  Neurological:     Mental Status: He is alert and oriented to person, place, and time.  Psychiatric:        Attention and Perception: Attention normal.        Mood and Affect: Mood normal.        Speech: Speech normal.        Behavior: Behavior normal. Behavior is cooperative.        Thought Content: Thought content normal.     Results for orders placed or performed in visit on 07/08/19  Glutamic acid decarboxylase auto abs  Result Value Ref Range   Glutamic Acid Decarb Ab <5.0 0.0 - 5.0 U/mL  C-peptide  Result Value Ref Range   C-Peptide 2.1 1.1 - 4.4 ng/mL      Assessment & Plan:   Problem List Items Addressed This Visit      Cardiovascular and Mediastinum   Hypertension associated with diabetes (Johnny Wolfe)    Chronic, ongoing.  BP at goal today and on rare home readings.  Continue current medication regimen and collaboration with cardiology.  Adjust as needed.  Return in 3 months. BMP today.      Relevant Medications   insulin glargine (LANTUS) 100 UNIT/ML Solostar Pen     Endocrine   Type 2 diabetes mellitus without complication (HCC) - Primary    Chronic, ongoing with A1C at goal = 6.2%. Stable with Lantus and Metformin use, will most likely need to maintain on long term Lantus as tolerates this well.  Continue to monitor BS at home and a post prandial check.  Urine ALB 30 and A:C<30 today.  Refills Lantus sent in.  Return in 3 months.      Relevant Medications   insulin glargine (LANTUS) 100 UNIT/ML Solostar Pen   Other Relevant Orders   Bayer DCA Hb A1c Waived   Basic metabolic panel   Microalbumin, Urine Waived   Hyperlipidemia associated with type 2 diabetes mellitus (HCC)    Chronic, ongoing.  Continue current medication regimen and adjust as needed.  Current LDL well controlled.        Relevant Medications   insulin  glargine (LANTUS) 100 UNIT/ML Solostar Pen     Other   Hypomagnesemia    Noted on past labs, taking daily supplement.  Will recheck today.      Relevant Orders   Magnesium       Follow up plan: Return in about 3 months (around 11/16/2019) for T2DM, HTN/HLD.

## 2019-08-18 ENCOUNTER — Encounter: Payer: Self-pay | Admitting: Nurse Practitioner

## 2019-08-18 ENCOUNTER — Other Ambulatory Visit: Payer: Self-pay | Admitting: Nurse Practitioner

## 2019-08-18 DIAGNOSIS — E875 Hyperkalemia: Secondary | ICD-10-CM

## 2019-08-18 LAB — BASIC METABOLIC PANEL
BUN/Creatinine Ratio: 22 (ref 10–24)
BUN: 19 mg/dL (ref 8–27)
CO2: 23 mmol/L (ref 20–29)
Calcium: 9.6 mg/dL (ref 8.6–10.2)
Chloride: 103 mmol/L (ref 96–106)
Creatinine, Ser: 0.85 mg/dL (ref 0.76–1.27)
GFR calc Af Amer: 101 mL/min/{1.73_m2} (ref >59)
GFR calc non Af Amer: 88 mL/min/{1.73_m2} (ref >59)
Glucose: 83 mg/dL (ref 65–99)
Potassium: 5.6 mmol/L — ABNORMAL HIGH (ref 3.5–5.2)
Sodium: 140 mmol/L (ref 134–144)

## 2019-08-18 LAB — MAGNESIUM: Magnesium: 1.1 mg/dL — ABNORMAL LOW (ref 1.6–2.3)

## 2019-08-18 MED ORDER — MAGNESIUM OXIDE 400 MG PO TABS: 1.0000 | ORAL_TABLET | Freq: Two times a day (BID) | ORAL | 3 refills | Status: DC

## 2019-08-18 NOTE — Progress Notes (Signed)
Contacted via MyChart

## 2019-11-02 NOTE — Progress Notes (Signed)
Cardiology Office Note  Date:  11/03/2019   ID:  Johnny Wolfe, DOB 1946/03/02, MRN [id number]  PCP:  Venita Lick, NP   Chief Complaint  Patient presents with  . office visit    3 month F/U; Meds verbally reviewed with patient.    HPI:  Johnny Wolfe is a 74 yo old gentleman with past medical history of Diabetes, Hemoglobin A1c 6.3 Hypertension Hyperlipidemia Smoker quit 2013 ETOH Coronary calcium score of 2310. Cardiac cath 08/02/2017 Severe proximal RCA disease, with stent placement Moderate proximal LAD and LCX disease Mild mid LAD, RCA disease, Vessels are heavily calcified. Presenting for follow-up of his syncope and CT coronary calcium scoring Who presents for follow-up of his coronary disease with stent to the RCA   Last  clinic February 2021  Echo at [facility] The left ventricle is normal in size with normal wall thickness. The left ventricular systolic function is normal, LVEF is visually estimated at > 55%. The right ventricle is normal in size, with normal systolic function.  Recent trip to [place], some hypersensitive to light Weight back up 10 pounds, Prior visit, had lost 20 pounds Less off orthostasis Has had med changes, off jardiance/ozympic  High potassium 5.4, 5.6, needs recheck Hemoglobin A1c 6.2 On insuilin  No orthostasis sx  EKG personally reviewed by myself on todays visit Shows normal sinus rhythm rate 62 bpm no significant ST or T wave changes  Other past medical history reviewed Fall, seen in the ER 05/12/2019  tripped on part of his rug, falling and striking the right side of his face on the floor. facial fracture requiring ORIF by Dr. Mardee Postin with ENT at [facility].   FAll, 06/18/2019, in the ER   syncopal episode at home which resulted in an injury to his chin and jaw got up tonight and felt dizzy and is not quite certain what happened but woke up on the floor with an odd and "full" feeling in his right ear and some bleeding under his  chin. Transferred to [facility] Has IVF 2 1/2 days  Cardiac cath 08/02/2017 Severe proximal RCA disease, with stent placement Moderate proximal LAD and LCX disease Mild mid LAD, RCA disease, Vessels are heavily calcified. Normal EF estimated at 55%  Coronary calcium score of 2310. This was 95 percentile for age and sex matched control. high calcium score.  severe heavy three-vessel disease  mild aortic atherosclerosis  Syncope: 05/21/2017 On [employer] to bed 7pm Woke up on the floor Remembered went to bathroom, woke up on floor, dark in room Then went to bathroom again,  Feeling for light Woke up again in the bathtub, little knot on head, Got up out of bathroom,  Little dizzy,  Sugars were 67  Father died 80s cancer Mother Obese, DM, cad, stent age 16, now age 59, now pacer Brother CAD, PCi   PMH:   has a past medical history of CAD (coronary artery disease), Cataract, Diabetes mellitus without complication ([place]), Essential hypertension, Hyperlipidemia, Syncope, and Wears dentures.  PSH:    Past Surgical History:  Procedure Laterality Date  . APPENDECTOMY    . CARDIAC CATHETERIZATION    . CATARACT EXTRACTION, BILATERAL     August 2017  . CORONARY STENT INTERVENTION N/A 08/02/2017   Procedure: CORONARY STENT INTERVENTION;  Surgeon: Wellington Hampshire, MD;  Location: [place] [facility];  Service: Cardiovascular;  Laterality: N/A;  . LEFT HEART CATH AND CORONARY ANGIOGRAPHY Left 08/02/2017   Procedure: LEFT HEART CATH AND  CORONARY ANGIOGRAPHY;  Surgeon: Minna Merritts, MD;  Location: [place] [facility];  Service: Cardiovascular;  Laterality: Left;  Marland Kitchen MASS EXCISION Left 07/04/2017   Procedure: REMOVAL TWO CYST EAR EAR LOBE AND EXTERNAL EAR;  Surgeon: Beverly Gust, MD;  Location: [place];  Service: ENT;  Laterality: Left;  LOCAL Diabetic - insulin and oral meds    Current Outpatient Medications  Medication Sig Dispense Refill  . aspirin EC 81 MG tablet  Take 81 mg by mouth daily.    Marland Kitchen atorvastatin (LIPITOR) 40 MG tablet TAKE 1 TABLET BY MOUTH  DAILY AT 6 PM 90 tablet 3  . glucose blood (ONE TOUCH ULTRA TEST) test strip Check blood 2-3 times a day 200 each 12  . insulin glargine (LANTUS) 100 UNIT/ML Solostar Pen Inject 16 Units into the skin daily. 30 mL 6  . Lancet Device MISC 1 Units by Does not apply route daily as needed. 100 each 11  . Lancets (ONETOUCH DELICA PLUS [id number]) MISC USE TO CHECK BLOOD SUGAR 2-3 TIMES DAILY    . magnesium oxide (MAG-OX) 400 MG tablet Take 1 tablet (400 mg total) by mouth 2 (two) times daily. 180 tablet 3  . metFORMIN (GLUCOPHAGE) 1000 MG tablet TAKE 1 TABLET BY MOUTH TWO  TIMES DAILY WITH A MEAL 180 tablet 3  . midodrine (PROAMATINE) 10 MG tablet Take 1 tablet (10 mg total) by mouth as needed (for low blood pressure (systolic less than 387)). 90 tablet 1  . nitroGLYCERIN (NITROSTAT) 0.4 MG SL tablet Place 1 tablet (0.4 mg total) under the tongue every 5 (five) minutes as needed for chest pain. 25 tablet 3  . vitamin B-12 (CYANOCOBALAMIN) 1000 MCG tablet Take 500 mcg by mouth daily.     . metoprolol tartrate (LOPRESSOR) 25 MG tablet Take 0.5 tablets (12.5 mg total) by mouth 2 (two) times daily. Hold for systolic blood pressure (top number) less than 110. 90 tablet 0   No current facility-administered medications for this visit.     Allergies:   Ace inhibitors and Neosporin [neomycin-bacitracin zn-polymyx]   Social History:  The patient  reports that he quit smoking about 8 years ago. He smoked 1.00 pack per day. He has never used smokeless tobacco. He reports current alcohol use of about 7.0 - 11.0 standard drinks of alcohol per week. He reports that he does not use drugs.   Family History:   family history includes Arrhythmia (age of onset: 5) in his mother; Cancer in his father; Diabetes in his mother.    Review of Systems: Review of Systems  Constitutional: Negative.   HENT: Negative.   Respiratory:  Negative.   Cardiovascular: Negative.   Gastrointestinal: Negative.   Musculoskeletal: Negative.   Neurological: Negative.   Psychiatric/Behavioral: Negative.   All other systems reviewed and are negative.   PHYSICAL EXAM: VS:  BP 120/60 (BP Location: Left Arm, Patient Position: Sitting, Cuff Size: Normal)   Pulse 62   Ht 6' (1.829 m)   Wt 159 lb 6 oz (72.3 kg)   SpO2 98%   BMI 21.62 kg/m  , BMI Body mass index is 21.62 kg/m. Constitutional:  oriented to person, place, and time. No distress.  HENT:  Head: Grossly normal Eyes:  no discharge. No scleral icterus.  Neck: No JVD, no carotid bruits  Cardiovascular: Regular rate and rhythm, no murmurs appreciated Pulmonary/Chest: Clear to auscultation bilaterally, no wheezes or rails Abdominal: Soft.  no distension.  no tenderness.  Musculoskeletal: Normal range  of motion Neurological:  normal muscle tone. Coordination normal. No atrophy Skin: Skin warm and dry Psychiatric: normal affect, pleasant  Recent Labs: 06/25/2019: ALT 11; Hemoglobin 13.7; Platelets 278; TSH 2.940 08/17/2019: BUN 19; Creatinine, Ser 0.85; Magnesium 1.1; Potassium 5.6; Sodium 140    Lipid Panel Lab Results  Component Value Date   CHOL 126 05/14/2019   HDL 70 02/18/2019   LDLCALC 38 02/18/2019   TRIG 113 05/14/2019      Wt Readings from Last 3 Encounters:  11/03/19 159 lb 6 oz (72.3 kg)  08/17/19 155 lb 4 oz (70.4 kg)  07/28/19 156 lb (70.8 kg)     ASSESSMENT AND PLAN:  Type 2 diabetes mellitus without complication, with long-term current use of insulin (HCC) Managed by primary care,  A1c of 6 weight up 10 pounds On insulin  Syncope and collapse -  orthostasis sx better, weight up 10 pounds Hydrating better  CAD, Stable angina cardiac catheterization April 2019   stent to the proximal RCA Denies anginal symptoms  High cholesterol -  Cholesterol is at goal on the current lipid regimen. No changes to the medications were  made.  Hyperkalemia avoiding ACE inhibitors ARB Recent potassium >5 Order placed to check his potassium and magnesium today   Total encounter time more than 25 minutes  Greater than 50% was spent in counseling and coordination of care with the patient    Orders Placed This Encounter  Procedures  . EKG 12-Lead     Signed, Esmond Plants, M.D., Ph.D. 11/03/2019  [place], [place]

## 2019-11-03 ENCOUNTER — Encounter: Payer: Self-pay | Admitting: Cardiovascular Disease

## 2019-11-03 ENCOUNTER — Ambulatory Visit (INDEPENDENT_AMBULATORY_CARE_PROVIDER_SITE_OTHER): Payer: Medicare Other | Admitting: Cardiovascular Disease

## 2019-11-03 ENCOUNTER — Other Ambulatory Visit: Payer: Self-pay

## 2019-11-03 VITALS — BP 120/60 | HR 62 | Ht 72.0 in | Wt 159.4 lb

## 2019-11-03 DIAGNOSIS — E119 Type 2 diabetes mellitus without complications: Secondary | ICD-10-CM | POA: Diagnosis not present

## 2019-11-03 DIAGNOSIS — R55 Syncope and collapse: Secondary | ICD-10-CM | POA: Diagnosis not present

## 2019-11-03 DIAGNOSIS — I951 Orthostatic hypotension: Secondary | ICD-10-CM | POA: Diagnosis not present

## 2019-11-03 DIAGNOSIS — I10 Essential (primary) hypertension: Secondary | ICD-10-CM | POA: Diagnosis not present

## 2019-11-03 DIAGNOSIS — Z794 Long term (current) use of insulin: Secondary | ICD-10-CM

## 2019-11-03 DIAGNOSIS — I25118 Atherosclerotic heart disease of native coronary artery with other forms of angina pectoris: Secondary | ICD-10-CM

## 2019-11-03 MED ORDER — ATORVASTATIN CALCIUM 40 MG PO TABS: ORAL_TABLET | ORAL | 3 refills | Status: DC

## 2019-11-03 NOTE — Patient Instructions (Addendum)
Labs: potassium, magnesium   Medication Instructions:  No changes  If you need a refill on your cardiac medications before your next appointment, please call your pharmacy.    Lab work: Your physician recommends that you return for lab work in: [place].    If you have labs (blood work) drawn today and your tests are completely normal, you will receive your results only by: Marland Kitchen MyChart Message (if you have MyChart) OR . A paper copy in the mail If you have any lab test that is abnormal or we need to change your treatment, we will call you to review the results.   Testing/Procedures: No new testing needed   Follow-Up: At [facility], you and your health needs are our priority.  As part of our continuing mission to provide you with exceptional heart care, we have created designated Provider Care Teams.  These Care Teams include your primary Cardiologist (physician) and Advanced Practice Providers (APPs -  Physician Assistants and Nurse Practitioners) who all work together to provide you with the care you need, when you need it.  . You will need a follow up appointment in 12 months .  Marland Kitchen Providers on your designated Care Team:   . Murray Hodgkins, NP . Christell Faith, PA-C . Marrianne Mood, PA-C  Any Other Special Instructions Will Be Listed Below (If Applicable).  For educational health videos Log in to : [email or web address] Or : [email or web address], password : [email or web address]

## 2019-11-04 LAB — MAGNESIUM: Magnesium: 1.7 mg/dL (ref 1.6–2.3)

## 2019-11-04 LAB — POTASSIUM: Potassium: 5.9 mmol/L — ABNORMAL HIGH (ref 3.5–5.2)

## 2019-11-06 ENCOUNTER — Telehealth: Payer: Self-pay | Admitting: *Deleted

## 2019-11-06 NOTE — Telephone Encounter (Signed)
Left voicemail message to call back for results.  

## 2019-11-06 NOTE — Telephone Encounter (Signed)
-----   Message from Minna Merritts, MD sent at 11/04/2019  2:25 PM EDT ----- Magnesium much better on his supplement Potassium is very high, need to discuss his diet Does not appear he is on a potassium supplement We need to avoid foods rich in potassium including citrus etc., recommend he pull up a list on the lab He continues to trend up since February We will CC primary care If numbers continue to trend up, might need medication such as valtessa

## 2019-11-09 NOTE — Telephone Encounter (Signed)
Reviewed results and recommendations with patient and he verbalized understanding. He mentioned that he has upcoming appointment with his PCP and let him know that these results were sent to her. He was appreciative for the follow up with no further questions at this time.

## 2019-11-16 ENCOUNTER — Encounter: Payer: Self-pay | Admitting: Nurse Practitioner

## 2019-11-16 ENCOUNTER — Other Ambulatory Visit: Payer: Self-pay

## 2019-11-16 ENCOUNTER — Ambulatory Visit (INDEPENDENT_AMBULATORY_CARE_PROVIDER_SITE_OTHER): Payer: Medicare Other | Admitting: Nurse Practitioner

## 2019-11-16 VITALS — BP 133/66 | HR 65 | Temp 98.1°F | Wt 159.4 lb

## 2019-11-16 DIAGNOSIS — E785 Hyperlipidemia, unspecified: Secondary | ICD-10-CM

## 2019-11-16 DIAGNOSIS — I700 Atherosclerosis of aorta: Secondary | ICD-10-CM | POA: Diagnosis not present

## 2019-11-16 DIAGNOSIS — E1169 Type 2 diabetes mellitus with other specified complication: Secondary | ICD-10-CM | POA: Diagnosis not present

## 2019-11-16 DIAGNOSIS — E119 Type 2 diabetes mellitus without complications: Secondary | ICD-10-CM | POA: Diagnosis not present

## 2019-11-16 DIAGNOSIS — I10 Essential (primary) hypertension: Secondary | ICD-10-CM | POA: Diagnosis not present

## 2019-11-16 DIAGNOSIS — E1159 Type 2 diabetes mellitus with other circulatory complications: Secondary | ICD-10-CM | POA: Diagnosis not present

## 2019-11-16 DIAGNOSIS — E875 Hyperkalemia: Secondary | ICD-10-CM

## 2019-11-16 DIAGNOSIS — I25119 Atherosclerotic heart disease of native coronary artery with unspecified angina pectoris: Secondary | ICD-10-CM | POA: Diagnosis not present

## 2019-11-16 DIAGNOSIS — Z794 Long term (current) use of insulin: Secondary | ICD-10-CM

## 2019-11-16 LAB — BAYER DCA HB A1C WAIVED: HB A1C (BAYER DCA - WAIVED): 6.4 % (ref <7.0)

## 2019-11-16 NOTE — Assessment & Plan Note (Signed)
Chronic, ongoing with A1C at goal = 6.4%. 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  Continue to monitor BS at home and a post prandial check.  Urine ALB 30 and A:C<30 last visit.  Return in 3 months.

## 2019-11-16 NOTE — Assessment & Plan Note (Signed)
Chronic, stable with no recent NTG use.  Continue current medication regimen and collaboration with cardiology. 

## 2019-11-16 NOTE — Assessment & Plan Note (Signed)
Noted on past imaging, continue statin and ASA daily for prevention.

## 2019-11-16 NOTE — Assessment & Plan Note (Signed)
Chronic, ongoing.  BP at goal today and on rare home readings.  Continue current medication regimen and collaboration with cardiology.  Adjust as needed.  Recommend he continue to monitor BP occasionally at home and document.  Return in 3 months. BMP today.

## 2019-11-16 NOTE — Patient Instructions (Signed)
Hyperkalemia Hyperkalemia is when you have too much potassium in your blood. Potassium helps your body in many ways, but having too much can cause problems. If there is too much potassium in your blood, it can affect how your heart works. Potassium is normally removed from your body by your kidneys. Many things can cause the amount in your blood to be high. Medicines and other treatments can be used to bring the amount to a normal level. Treatment may need to be done in the hospital. Follow these instructions at home:   Take over-the-counter and prescription medicines only as told by your doctor.  Do not take any of the following unless your doctor says it is okay: ? Supplements. ? Natural products. ? Herbs. ? Vitamins.  Limit how much alcohol you drink as told by your doctor.  Do not use drugs. If you need help quitting, ask your doctor.  If you have kidney disease, you may need to follow a low-potassium diet. A food specialist (dietitian) can help you.  Keep all follow-up visits as told by your doctor. This is important. Contact a doctor if:  Your heartbeat is not regular or is very slow.  You feel dizzy (light-headed).  You feel weak.  You feel sick to your stomach (nauseous).  You have tingling in your hands or feet.  You lose feeling (have numbness) in your hands or feet. Get help right away if:  You are short of breath.  You have chest pain.  You pass out (faint).  You cannot move your muscles. Summary  Hyperkalemia is when you have too much potassium in your blood.  Take over-the-counter and prescription medicines only as told by your doctor.  Limit how much alcohol you drink as told by your doctor.  Contact a doctor if your heartbeat is not regular. This information is not intended to replace advice given to you by your health care provider. Make sure you discuss any questions you have with your health care provider. Document Revised: 04/01/2017 Document  Reviewed: 04/01/2017 Elsevier Patient Education  2020 Elsevier Inc.  

## 2019-11-16 NOTE — Assessment & Plan Note (Addendum)
Noted to be trending up, recheck today and consider Valtessa per cardiology recommendation if ongoing elevation.  No current ACE or ARB present.  He has been focused on lowering K+ in diet and increasing fluids.

## 2019-11-16 NOTE — Progress Notes (Signed)
BP 133/66   Pulse 65   Temp 98.1 F (36.7 C) (Oral)   Wt 159 lb 6.4 oz (72.3 kg)   SpO2 97%   BMI 21.62 kg/m    Subjective:    Patient ID: Johnny Wolfe, male    DOB: [birth year], 74 y.o.   MRN: [id number]  HPI: Johnny Wolfe is a 74 y.o. male  Chief Complaint  Patient presents with  . Diabetes  . Hyperlipidemia  . Hypertension   DIABETES Last A1C 6.2% in April. Continues on Metformin 1000 MG BID and Lantus 15 units.  Have tried GLP1 and SGLT, but this was not beneficial.  He lost weight with changes in medications and felt tired, feels better and is gaining weight back on insulin. Glutamic acid <5.0 and C peptide 2.1 this year on check. Hypoglycemic episodes:no Polydipsia/polyuria:no Visual disturbance:no Chest pain:no Paresthesias:no Glucose Monitoring:yes Accucheck frequency: Daily Fasting glucose: 64 to 190 Post prandial:  Evening: Before meals: Taking Insulin?:no Long acting insulin: Lantus 15 units Short acting insulin: Blood Pressure Monitoring:daily Retinal Examination:Up to Date Foot Exam:Up to Date Pneumovax:Up to Date Influenza:Up to Date Aspirin:yes  HYPERTENSION / HYPERLIPIDEMIA Continues on ASA and Metoprolol + Lipitor. Last saw cardiology 11/03/2019 and K+ level trending up on labs -- they discussed diet with him and if ongoing may need to add on Valtessa.  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   Satisfied with current treatment? yes Duration of hypertension: chronic BP monitoring frequency: rarely BP range: 120/60-70 range BP medication side effects: no Duration of hyperlipidemia: chronic Cholesterol medication side effects: no Cholesterol supplements: none Medication compliance: good compliance Aspirin: yes Recent stressors: no Recurrent headaches: no Visual changes: no Palpitations: no Dyspnea: no Chest pain:  no Lower extremity edema: no Dizzy/lightheaded: no  Relevant past medical, surgical, family and social history reviewed and updated as indicated. Interim medical history since our last visit reviewed. Allergies and medications reviewed and updated.  Review of Systems  Constitutional: Negative for activity change, diaphoresis, fatigue and fever.  Respiratory: Negative for cough, chest tightness, shortness of breath and wheezing.   Cardiovascular: Negative for chest pain, palpitations and leg swelling.  Gastrointestinal: Negative.   Endocrine: Negative for polydipsia, polyphagia and polyuria.  Neurological: Negative.   Psychiatric/Behavioral: Negative.     Per HPI unless specifically indicated above     Objective:    BP 133/66   Pulse 65   Temp 98.1 F (36.7 C) (Oral)   Wt 159 lb 6.4 oz (72.3 kg)   SpO2 97%   BMI 21.62 kg/m   Wt Readings from Last 3 Encounters:  11/16/19 159 lb 6.4 oz (72.3 kg)  11/03/19 159 lb 6 oz (72.3 kg)  08/17/19 155 lb 4 oz (70.4 kg)    Physical Exam Vitals and nursing note reviewed.  Constitutional:      General: He is awake. He is not in acute distress.    Appearance: He is well-developed and well-groomed. He is not ill-appearing.  HENT:     Head: Normocephalic and atraumatic.     Right Ear: Hearing normal. No drainage.     Left Ear: Hearing normal. No drainage.  Eyes:     General: Lids are normal.        Right eye: No discharge.        Left eye: No discharge.     Conjunctiva/sclera: Conjunctivae normal.     Pupils: Pupils are equal, round, and reactive to light.  Neck:     Vascular: No carotid bruit.  Cardiovascular:     Rate and Rhythm: Normal rate and regular rhythm.     Heart sounds: Normal heart sounds, S1 normal and S2 normal. No murmur heard.  No gallop.   Pulmonary:     Effort: Pulmonary effort is normal. No accessory muscle usage or respiratory distress.     Breath sounds: Normal breath sounds.  Abdominal:     General: Bowel  sounds are normal.     Palpations: Abdomen is soft.  Musculoskeletal:        General: Normal range of motion.     Cervical back: Normal range of motion and neck supple.     Right lower leg: No edema.     Left lower leg: No edema.  Skin:    General: Skin is warm and dry.  Neurological:     Mental Status: He is alert and oriented to person, place, and time.  Psychiatric:        Attention and Perception: Attention normal.        Mood and Affect: Mood normal.        Speech: Speech normal.        Behavior: Behavior normal. Behavior is cooperative.        Thought Content: Thought content normal.    Diabetic Foot Exam - Simple   Simple Foot Form Visual Inspection No deformities, no ulcerations, no other skin breakdown bilaterally: Yes Sensation Testing Intact to touch and monofilament testing bilaterally: Yes Pulse Check Posterior Tibialis and Dorsalis pulse intact bilaterally: Yes Comments     Results for orders placed or performed in visit on 11/03/19  Magnesium  Result Value Ref Range   Magnesium 1.7 1.6 - 2.3 mg/dL  Potassium  Result Value Ref Range   Potassium 5.9 (H) 3.5 - 5.2 mmol/L      Assessment & Plan:   Problem List Items Addressed This Visit      Cardiovascular and Mediastinum   Hypertension associated with diabetes ([place])    Chronic, ongoing.  BP at goal today and on rare home readings.  Continue current medication regimen and collaboration with cardiology.  Adjust as needed.  Recommend he continue to monitor BP occasionally at home and document.  Return in 3 months. BMP today.      Relevant Orders   Bayer DCA Hb A1c Waived   Basic metabolic panel   Coronary artery disease involving native coronary artery with angina pectoris (HCC)    Chronic, stable with no recent NTG use.  Continue current medication regimen and collaboration with cardiology.      Aortic atherosclerosis ([place])    Noted on past imaging, continue statin and ASA daily for prevention.          Endocrine   Type 2 diabetes mellitus without complication (HCC) - Primary    Chronic, ongoing with A1C at goal = 6.4%. 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  Continue to monitor BS at home and a post prandial check.  Urine ALB 30 and A:C<30 last visit.  Return in 3 months.      Relevant Orders   Bayer DCA Hb A1c Waived   Hyperlipidemia associated with type 2 diabetes mellitus (HCC)    Chronic, ongoing.  Continue current medication regimen and adjust as needed.  Current LDL well controlled.  Lipid panel today.  Relevant Orders   Bayer DCA Hb A1c Waived   Lipid Panel w/o Chol/HDL Ratio     Other   Hyperkalemia    Noted to be trending up, recheck today and consider Valtessa per cardiology recommendation if ongoing elevation.  No current ACE or ARB present.  He has been focused on lowering K+ in diet and increasing fluids.          Follow up plan: Return in about 3 months (around 02/16/2020) for T2DM, HTN/HLD.

## 2019-11-16 NOTE — Assessment & Plan Note (Signed)
Chronic, ongoing.  Continue current medication regimen and adjust as needed.  Current LDL well controlled.  Lipid panel today.

## 2019-11-17 LAB — BASIC METABOLIC PANEL
BUN/Creatinine Ratio: 19 (ref 10–24)
BUN: 15 mg/dL (ref 8–27)
CO2: 24 mmol/L (ref 20–29)
Calcium: 9.8 mg/dL (ref 8.6–10.2)
Chloride: 100 mmol/L (ref 96–106)
Creatinine, Ser: 0.78 mg/dL (ref 0.76–1.27)
GFR calc Af Amer: 105 mL/min/{1.73_m2} (ref >59)
GFR calc non Af Amer: 91 mL/min/{1.73_m2} (ref >59)
Glucose: 86 mg/dL (ref 65–99)
Potassium: 5.2 mmol/L (ref 3.5–5.2)
Sodium: 138 mmol/L (ref 134–144)

## 2019-11-17 LAB — LIPID PANEL W/O CHOL/HDL RATIO
Cholesterol, Total: 136 mg/dL (ref 100–199)
HDL: 73 mg/dL (ref >39)
LDL Chol Calc (NIH): 50 mg/dL (ref 0–99)
Triglycerides: 63 mg/dL (ref 0–149)
VLDL Cholesterol Cal: 13 mg/dL (ref 5–40)

## 2019-11-17 NOTE — Progress Notes (Signed)
Contacted via MyChart  Good morning [name], your labs have returned and potassium has come down.  Continue focus on diet and good water intake.  It is on higher side of normal, but improved.  Cholesterol levels are at goal.  Keep up the good work. Keep being awesome!! Kindest regards, [place] Thresher

## 2020-02-16 ENCOUNTER — Encounter: Payer: Self-pay | Admitting: Nurse Practitioner

## 2020-02-16 ENCOUNTER — Ambulatory Visit (INDEPENDENT_AMBULATORY_CARE_PROVIDER_SITE_OTHER): Payer: Medicare Other | Admitting: Nurse Practitioner

## 2020-02-16 ENCOUNTER — Other Ambulatory Visit: Payer: Self-pay

## 2020-02-16 VITALS — BP 128/79 | HR 63 | Temp 97.7°F | Resp 16 | Ht 72.0 in | Wt 157.8 lb

## 2020-02-16 DIAGNOSIS — I152 Hypertension secondary to endocrine disorders: Secondary | ICD-10-CM

## 2020-02-16 DIAGNOSIS — Z794 Long term (current) use of insulin: Secondary | ICD-10-CM

## 2020-02-16 DIAGNOSIS — E1169 Type 2 diabetes mellitus with other specified complication: Secondary | ICD-10-CM | POA: Diagnosis not present

## 2020-02-16 DIAGNOSIS — E119 Type 2 diabetes mellitus without complications: Secondary | ICD-10-CM | POA: Diagnosis not present

## 2020-02-16 DIAGNOSIS — E1159 Type 2 diabetes mellitus with other circulatory complications: Secondary | ICD-10-CM | POA: Diagnosis not present

## 2020-02-16 DIAGNOSIS — E875 Hyperkalemia: Secondary | ICD-10-CM

## 2020-02-16 DIAGNOSIS — E1165 Type 2 diabetes mellitus with hyperglycemia: Secondary | ICD-10-CM | POA: Diagnosis not present

## 2020-02-16 DIAGNOSIS — Z23 Encounter for immunization: Secondary | ICD-10-CM | POA: Diagnosis not present

## 2020-02-16 DIAGNOSIS — E785 Hyperlipidemia, unspecified: Secondary | ICD-10-CM

## 2020-02-16 LAB — BAYER DCA HB A1C WAIVED: HB A1C (BAYER DCA - WAIVED): 6.3 % (ref <7.0)

## 2020-02-16 NOTE — Assessment & Plan Note (Signed)
Chronic, ongoing with A1C at goal = 6.3%. 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  Continue to monitor BS at home and a post prandial check.  Urine ALB 30 and A:C<30 last visit.  Return in 3 months.

## 2020-02-16 NOTE — Assessment & Plan Note (Signed)
Chronic, ongoing.  BP at goal today.  Continue current medication regimen and collaboration with cardiology.  Adjust as needed.  Recommend he continue to monitor BP occasionally at home and document + focus on DASH diet.  Return in 3 months.

## 2020-02-16 NOTE — Assessment & Plan Note (Signed)
Noted to be trending down last visit, recheck today and consider Valtessa per cardiology recommendation if ongoing elevation.  No current ACE or ARB present.  He has been focused on lowering K+ in diet and increasing fluids.  Check Mag level as well today.

## 2020-02-16 NOTE — Patient Instructions (Signed)

## 2020-02-16 NOTE — Assessment & Plan Note (Signed)
Chronic, ongoing.  Continue current medication regimen and adjust as needed.  Current LDL well controlled.  Lipid panel next visit.

## 2020-02-16 NOTE — Progress Notes (Signed)
BP 128/79 (BP Location: Left Arm, Patient Position: Sitting, Cuff Size: Normal)   Pulse 63   Temp 97.7 F (36.5 C) (Oral)   Resp 16   Ht 6' (1.829 m)   Wt 157 lb 12.8 oz (71.6 kg)   SpO2 98%   BMI 21.40 kg/m    Subjective:    Patient ID: Johnny Wolfe, male    DOB: [birth year], 74 y.o.   MRN: [id number]  HPI: Johnny Wolfe is a 74 y.o. male  Chief Complaint  Patient presents with  . Diabetes   DIABETES Last A1C 6.4% in July.  Continues on Metformin 1000 MG BID and Lantus 15 units.  Have tried GLP1 and SGLT, but this was not beneficial.  He lost weight with changes in medications and felt tired, feels better and is gaining weight back on insulin. Glutamic acid <5.0 and C peptide 2.1 this year on check. Hypoglycemic episodes:no Polydipsia/polyuria:no Visual disturbance:no Chest pain:no Paresthesias:no Glucose Monitoring:yes Accucheck frequency: Daily Fasting glucose: this morning 68 -- average 100 range Post prandial:  Evening: Before meals: Taking Insulin?:no Long acting insulin: Lantus 15 units Short acting insulin: Blood Pressure Monitoring:daily Retinal Examination:Up to Date Foot Exam:Up to Date Pneumovax:Up to Date Influenza:Up to Date Aspirin:yes  HYPERTENSION / HYPERLIPIDEMIA Continues on ASA and Metoprolol + Lipitor. Last saw cardiology 11/03/2019 and K+ level trending up on labs -- they discussed diet with him and if ongoing may need to add on Valtessa --- recent K+ on 11/13/19 was 5.2 and Mag 1.7.  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 Satisfied with current treatment? yes Duration of hypertension: chronic BP monitoring frequency: not checking BP range: not checking BP medication side effects: no Duration of hyperlipidemia: chronic Cholesterol medication side effects: no Cholesterol supplements: none Medication compliance: good compliance Aspirin:  yes Recent stressors: no Recurrent headaches: no Visual changes: no Palpitations: no Dyspnea: no Chest pain: no Lower extremity edema: no Dizzy/lightheaded: no  Relevant past medical, surgical, family and social history reviewed and updated as indicated. Interim medical history since our last visit reviewed. Allergies and medications reviewed and updated.  Review of Systems  Constitutional: Negative for activity change, diaphoresis, fatigue and fever.  Respiratory: Negative for cough, chest tightness, shortness of breath and wheezing.   Cardiovascular: Negative for chest pain, palpitations and leg swelling.  Gastrointestinal: Negative.   Endocrine: Negative for polydipsia, polyphagia and polyuria.  Neurological: Negative.   Psychiatric/Behavioral: Negative.     Per HPI unless specifically indicated above     Objective:    BP 128/79 (BP Location: Left Arm, Patient Position: Sitting, Cuff Size: Normal)   Pulse 63   Temp 97.7 F (36.5 C) (Oral)   Resp 16   Ht 6' (1.829 m)   Wt 157 lb 12.8 oz (71.6 kg)   SpO2 98%   BMI 21.40 kg/m   Wt Readings from Last 3 Encounters:  02/16/20 157 lb 12.8 oz (71.6 kg)  11/16/19 159 lb 6.4 oz (72.3 kg)  11/03/19 159 lb 6 oz (72.3 kg)    Physical Exam Vitals and nursing note reviewed.  Constitutional:      General: He is awake. He is not in acute distress.    Appearance: He is well-developed and well-groomed. He is not ill-appearing.  HENT:     Head: Normocephalic and atraumatic.     Right Ear: Hearing normal. No drainage.     Left Ear: Hearing normal. No drainage.  Eyes:     General: Lids are normal.  Right eye: No discharge.        Left eye: No discharge.     Conjunctiva/sclera: Conjunctivae normal.     Pupils: Pupils are equal, round, and reactive to light.  Neck:     Vascular: No carotid bruit.  Cardiovascular:     Rate and Rhythm: Normal rate and regular rhythm.     Heart sounds: Normal heart sounds, S1 normal and S2  normal. No murmur heard.  No gallop.   Pulmonary:     Effort: Pulmonary effort is normal. No accessory muscle usage or respiratory distress.     Breath sounds: Normal breath sounds.  Abdominal:     General: Bowel sounds are normal.     Palpations: Abdomen is soft.  Musculoskeletal:        General: Normal range of motion.     Cervical back: Normal range of motion and neck supple.     Right lower leg: No edema.     Left lower leg: No edema.  Skin:    General: Skin is warm and dry.  Neurological:     Mental Status: He is alert and oriented to person, place, and time.  Psychiatric:        Attention and Perception: Attention normal.        Mood and Affect: Mood normal.        Speech: Speech normal.        Behavior: Behavior normal. Behavior is cooperative.        Thought Content: Thought content normal.     Results for orders placed or performed in visit on 11/16/19  Bayer DCA Hb A1c Waived  Result Value Ref Range   HB A1C (BAYER DCA - WAIVED) 6.4 <9.5 %  Basic metabolic panel  Result Value Ref Range   Glucose 86 65 - 99 mg/dL   BUN 15 8 - 27 mg/dL   Creatinine, Ser 0.78 0.76 - 1.27 mg/dL   GFR calc non Af Amer 91 >59 mL/min/1.73   GFR calc Af Amer 105 >59 mL/min/1.73   BUN/Creatinine Ratio 19 10 - 24   Sodium 138 134 - 144 mmol/L   Potassium 5.2 3.5 - 5.2 mmol/L   Chloride 100 96 - 106 mmol/L   CO2 24 20 - 29 mmol/L   Calcium 9.8 8.6 - 10.2 mg/dL  Lipid Panel w/o Chol/HDL Ratio  Result Value Ref Range   Cholesterol, Total 136 100 - 199 mg/dL   Triglycerides 63 0 - 149 mg/dL   HDL 73 >39 mg/dL   VLDL Cholesterol Cal 13 5 - 40 mg/dL   LDL Chol Calc (NIH) 50 0 - 99 mg/dL      Assessment & Plan:   Problem List Items Addressed This Visit      Cardiovascular and Mediastinum   Hypertension associated with diabetes (HCC)    Chronic, ongoing.  BP at goal today.  Continue current medication regimen and collaboration with cardiology.  Adjust as needed.  Recommend he  continue to monitor BP occasionally at home and document + focus on DASH diet.  Return in 3 months.         Endocrine   Type 2 diabetes mellitus with hyperglycemia (HCC) - Primary    Chronic, ongoing with A1C at goal = 6.3%. 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  Continue to monitor BS at home and a post prandial check.  Urine ALB 30 and A:C<30 last visit.  Return in 3 months.      Relevant Orders   Bayer DCA Hb A1c Waived   Hyperlipidemia associated with type 2 diabetes mellitus (HCC)    Chronic, ongoing.  Continue current medication regimen and adjust as needed.  Current LDL well controlled.  Lipid panel next visit.        Other   Hyperkalemia    Noted to be trending down last visit, recheck today and consider Valtessa per cardiology recommendation if ongoing elevation.  No current ACE or ARB present.  He has been focused on lowering K+ in diet and increasing fluids.  Check Mag level as well today.      Relevant Orders   Potassium   Magnesium    Other Visit Diagnoses    Need for influenza vaccination       Flu vaccine today   Relevant Orders   Flu Vaccine QUAD High Dose(Fluad) (Completed)       Follow up plan: Return in about 3 months (around 05/18/2020) for T2DM, HTN/HLD.

## 2020-02-17 ENCOUNTER — Other Ambulatory Visit: Payer: Self-pay | Admitting: Nurse Practitioner

## 2020-02-17 DIAGNOSIS — E875 Hyperkalemia: Secondary | ICD-10-CM

## 2020-02-17 LAB — MAGNESIUM: Magnesium: 1.6 mg/dL (ref 1.6–2.3)

## 2020-02-17 LAB — POTASSIUM: Potassium: 5.3 mmol/L — ABNORMAL HIGH (ref 3.5–5.2)

## 2020-02-17 NOTE — Progress Notes (Signed)
Contacted via MyChart  Good morning [name], your labs have returned.  Magnesium level is normal, on low end of normal at 1.6, but normal range.  Potassium is mildly elevated again as expected, would recommend return to good hydration and watching foods that are high in potassium and would like to check this again via outpatient labs in 4 weeks.  Sound good? Any questions? Keep being awesome!!  Thank you for allowing me to participate in your care. Kindest regards, Almalik Weissberg

## 2020-03-01 ENCOUNTER — Telehealth: Payer: Self-pay | Admitting: Nurse Practitioner

## 2020-03-01 NOTE — Telephone Encounter (Signed)
Copied from [place] [id number]. Topic: Medicare AWV >> Mar 01, 2020 10:20 AM Cher Nakai R wrote: Reason for CRM:  Left message  - need to reschedule AWVS from  Mar 09, 2020 to  Nov 12th at 1:00pm  and to do by phone due to [facility] out of the office -srs

## 2020-03-09 ENCOUNTER — Ambulatory Visit: Payer: Medicare Other

## 2020-03-11 ENCOUNTER — Ambulatory Visit (INDEPENDENT_AMBULATORY_CARE_PROVIDER_SITE_OTHER): Payer: Medicare Other

## 2020-03-11 VITALS — Ht 72.0 in | Wt 160.0 lb

## 2020-03-11 DIAGNOSIS — Z0000 Encounter for general adult medical examination without abnormal findings: Secondary | ICD-10-CM

## 2020-03-11 NOTE — Progress Notes (Signed)
I connected with Johnny Wolfe today by telephone and verified that I am speaking with the correct person using two identifiers. Location patient: home Location provider: work Persons participating in the virtual visit: Ulrich, Soules LPN.   I discussed the limitations, risks, security and privacy concerns of performing an evaluation and management service by telephone and the availability of in person appointments. I also discussed with the patient that there may be a patient responsible charge related to this service. The patient expressed understanding and verbally consented to this telephonic visit.    Interactive audio and video telecommunications were attempted between this provider and patient, however failed, due to patient having technical difficulties OR patient did not have access to video capability.  We continued and completed visit with audio only.     Vital signs may be patient reported or missing.  Subjective:   Johnny Wolfe is a 74 y.o. male who presents for Medicare Annual/Subsequent preventive examination.  Review of Systems     Cardiac Risk Factors include: advanced age (>23men, >81 women);diabetes mellitus;dyslipidemia;hypertension;male gender;sedentary lifestyle     Objective:    Today's Vitals   03/11/20 1256  Weight: 160 lb (72.6 kg)  Height: 6' (1.829 m)   Body mass index is 21.7 kg/m.  Advanced Directives 03/11/2020 05/12/2019 03/05/2019 11/04/2017 08/02/2017 08/02/2017 07/04/2017  Does Patient Have a Medical Advance Directive? Yes No Yes Yes - No Yes  Type of Paramedic of [place];Living will - Living will;Healthcare Power of Attorney Living will;Healthcare Power of [place];Living will  Does patient want to make changes to medical advance directive? - - - - - - No - Patient declined  Copy of [place] in Chart? Yes - validated most recent copy scanned in chart (See row  information) - Yes - validated most recent copy scanned in chart (See row information) No - copy requested - - No - copy requested  Would patient like information on creating a medical advance directive? - No - Patient declined - - No - Patient declined - -    Current Medications (verified) Outpatient Encounter Medications as of 03/11/2020  Medication Sig  . aspirin EC 81 MG tablet Take 81 mg by mouth daily.  Marland Kitchen atorvastatin (LIPITOR) 40 MG tablet TAKE 1 TABLET BY MOUTH  DAILY AT 6 PM  . glucose blood (ONE TOUCH ULTRA TEST) test strip Check blood 2-3 times a day  . insulin glargine (LANTUS) 100 UNIT/ML Solostar Pen Inject 16 Units into the skin daily. (Patient taking differently: Inject 15 Units into the skin daily. )  . Lancet Device MISC 1 Units by Does not apply route daily as needed.  . Lancets (ONETOUCH DELICA PLUS [id number]) MISC USE TO CHECK BLOOD SUGAR 2-3 TIMES DAILY  . magnesium oxide (MAG-OX) 400 MG tablet Take 1 tablet (400 mg total) by mouth 2 (two) times daily.  . metFORMIN (GLUCOPHAGE) 1000 MG tablet TAKE 1 TABLET BY MOUTH TWO  TIMES DAILY WITH A MEAL  . nitroGLYCERIN (NITROSTAT) 0.4 MG SL tablet Place 1 tablet (0.4 mg total) under the tongue every 5 (five) minutes as needed for chest pain.  . vitamin B-12 (CYANOCOBALAMIN) 1000 MCG tablet Take 500 mcg by mouth daily.   . metoprolol tartrate (LOPRESSOR) 25 MG tablet Take 0.5 tablets (12.5 mg total) by mouth 2 (two) times daily. Hold for systolic blood pressure (top number) less than 110.   No facility-administered encounter medications on file as of  03/11/2020.    Allergies (verified) Ace inhibitors and Neosporin [neomycin-bacitracin zn-polymyx]   History: Past Medical History:  Diagnosis Date  . CAD (coronary artery disease)    a. 06/2017 Cardiac CT: Ca2+ score of 2310 - 95th%'ile;  b. 07/2017 Cath/PCI: LM nl, LAD 60p, 46m, LCX 60p, OM2 40p, RCA dominant, 80p (3.5x15 [place] DES), 23m, 30d, EF 55-65%.  . Cataract   .  Diabetes mellitus without complication ([place])   . Essential hypertension   . Hyperlipidemia   . Syncope   . Wears dentures    full upper and lower   Past Surgical History:  Procedure Laterality Date  . APPENDECTOMY    . CARDIAC CATHETERIZATION    . CATARACT EXTRACTION, BILATERAL     August 2017  . CORONARY STENT INTERVENTION N/A 08/02/2017   Procedure: CORONARY STENT INTERVENTION;  Surgeon: Wellington Hampshire, MD;  Location: [place] [facility];  Service: Cardiovascular;  Laterality: N/A;  . LEFT HEART CATH AND CORONARY ANGIOGRAPHY Left 08/02/2017   Procedure: LEFT HEART CATH AND CORONARY ANGIOGRAPHY;  Surgeon: Minna Merritts, MD;  Location: [place] [facility];  Service: Cardiovascular;  Laterality: Left;  Marland Kitchen MASS EXCISION Left 07/04/2017   Procedure: REMOVAL TWO CYST EAR EAR LOBE AND EXTERNAL EAR;  Surgeon: Beverly Gust, MD;  Location: [place];  Service: ENT;  Laterality: Left;  LOCAL Diabetic - insulin and oral meds  . upper jaw Right 06/2019   at [facility] History  Problem Relation Age of Onset  . Diabetes Mother   . Arrhythmia Mother 32       Pacemaker implant   . Cancer Father        lung   Social History   Socioeconomic History  . Marital status: Married    Spouse name: Not on file  . Number of children: Not on file  . Years of education: Not on file  . Highest education level: Not on file  Occupational History  . Not on file  Tobacco Use  . Smoking status: Former Smoker    Packs/day: 1.00    Quit date: 10/27/2011    Years since quitting: 8.3  . Smokeless tobacco: Never Used  Vaping Use  . Vaping Use: Never used  Substance and Sexual Activity  . Alcohol use: Yes    Alcohol/week: 7.0 - 11.0 standard drinks    Types: 1 Glasses of wine, 6 - 10 Shots of liquor per week    Comment: weekly  . Drug use: No  . Sexual activity: Not Currently  Other Topics Concern  . Not on file  Social History Narrative  . Not on file   Social Determinants of  Health   Financial Resource Strain: Low Risk   . Difficulty of Paying Living Expenses: Not hard at all  Food Insecurity: No Food Insecurity  . Worried About Charity fundraiser in the Last Year: Never true  . Ran Out of Food in the Last Year: Never true  Transportation Needs: No Transportation Needs  . Lack of Transportation (Medical): No  . Lack of Transportation (Non-Medical): No  Physical Activity: Inactive  . Days of Exercise per Week: 0 days  . Minutes of Exercise per Session: 0 min  Stress: No Stress Concern Present  . Feeling of Stress : Not at all  Social Connections:   . Frequency of Communication with Friends and Family: Not on file  . Frequency of Social Gatherings with Friends and Family: Not on file  .  Attends Religious Services: Not on file  . Active Member of Clubs or Organizations: Not on file  . Attends Archivist Meetings: Not on file  . Marital Status: Not on file    Tobacco Counseling Counseling given: Not Answered   Clinical Intake:  Pre-visit preparation completed: Yes  Pain : No/denies pain     Nutritional Status: BMI of 19-24  Normal Nutritional Risks: None Diabetes: Yes  How often do you need to have someone help you when you read instructions, pamphlets, or other written materials from your doctor or pharmacy?: 1 - Never What is the last grade level you completed in school?: associate's degree  Diabetic? Yes Nutrition Risk Assessment:  Has the patient had any N/V/D within the last 2 months?  No  Does the patient have any non-healing wounds?  No  Has the patient had any unintentional weight loss or weight gain?  No   Diabetes:  Is the patient diabetic?  Yes  If diabetic, was a CBG obtained today?  No  Did the patient bring in their glucometer from home?  No  How often do you monitor your CBG's? daily.   Financial Strains and Diabetes Management:  Are you having any financial strains with the device, your supplies or your  medication? No .  Does the patient want to be seen by Chronic Care Management for management of their diabetes?  No  Would the patient like to be referred to a Nutritionist or for Diabetic Management?  No   Diabetic Exams:  Diabetic Eye Exam: Completed 04/17/2019 Diabetic Foot Exam: Completed 11/16/2019   Interpreter Needed?: No  Information entered by :: NAllen LPN   Activities of Daily Living In your present state of health, do you have any difficulty performing the following activities: 03/11/2020  Hearing? Y  Comment decreased hearing in right ear  Vision? N  Difficulty concentrating or making decisions? N  Walking or climbing stairs? N  Dressing or bathing? N  Doing errands, shopping? N  Preparing Food and eating ? N  Using the Toilet? N  In the past six months, have you accidently leaked urine? N  Do you have problems with loss of bowel control? N  Managing your Medications? N  Managing your Finances? N  Housekeeping or managing your Housekeeping? N  Some recent data might be hidden    Patient Care Team: Venita Lick, NP as PCP - General (Nurse Practitioner) Minna Merritts, MD as PCP - Cardiology (Cardiology)  Indicate any recent Medical Services you may have received from other than [facility] providers in the past year (date may be approximate).     Assessment:   This is a routine wellness examination for Johnny Wolfe.  Hearing/Vision screen  Hearing Screening   125Hz  250Hz  500Hz  1000Hz  2000Hz  3000Hz  4000Hz  6000Hz  8000Hz   Right ear:           Left ear:           Vision Screening Comments: Regular eye exams, [facility]  Dietary issues and exercise activities discussed: Current Exercise Habits: The patient does not participate in regular exercise at present  Goals    .  DIET - INCREASE WATER INTAKE      Recommend drinking at least 6-8 glasses of water a day     .  Patient Stated      03/11/2020, stay as healthy as he can and live as long as he can     .  PharmD "I want  to stay healthy" (pt-stated)      CARE PLAN ENTRY (see longtitudinal plan of care for additional care plan information)  Current Barriers:  . Diabetes: controlled; most recent A1c 6.3%  . Recent issue w/ syncopal episodes, lack of appetite, and significant weight loss over the past few months. Does appear to be a component of orthostasis.  . Denies significant change in vitals or sugars since stopping Jardiance a few days ago. Continues to endorse lack of appetite. Per his review today, he does note that weight loss has been more significant since starting Ozempic.  Marland Kitchen Notes little impact on BG when Jardiance or Ozempic were added or titrated . Current antihyperglycemic regimen: metformin 1000 mg BID, Jardiance 25 mg daily- has been holding for the past few days, Ozempic 0.5 mg  o Notes hx Glyxambi (metformin/sitagliptin), pioglitazone (d/c d/t cardiac disease hx) . Current blood glucose readings:   o High 130-150s; doesn't think there was much benefit w/ Jardiance or Ozempic dose increases;  . Cardiovascular risk reduction (hx CAD s/p stent to RCA in 07/2017) o Current hypertensive regimen: metoprolol tartrate 12.5 mg BID- HELD d/t orthostatic hypotension - Visit w/ Dr Rockey Situ, discussed potential for [place] in the future  o Current hyperlipidemia regimen: atorvastatin 40 mg daily; LDL at goal <70 o Antiplatelet regimen: ASA 81 mg (completed 1 year of DAPT w/ clopidogrel)  Pharmacist Clinical Goal(s):  Marland Kitchen Over the next 90 days, patient will work with PharmD and primary care provider to address optimized medication management  Interventions: . Given lack of benefit w/ SGLT2 or GLP1, and patient's physical characteristics (lack of being overweight), and/or longer duration of diabetes, may suspect a more T1.5 picture. May consider checking c peptide/GAD autoantibodies to determine. If C peptide negligible, we would know that SGLT2/GLP1 unlikely to benefit, and conservative  doses of basal insulin to minimize hypoglycemia would be most appropriate. Discussed this possibility with patient, he notes that he wouldn't mind paying for the test if insurance doesn't cover.  . Additionally, worry that diuretic effects of SGLT2 and weight loss effects of GLP1 may be contributing to current symptoms. Also believe it appropriate to try a trial off of both SGLT2 and GLP1. Patient reports he has a supply of Lantus at home that could be restarted if needed. Will collaborate w/ PCP for next steps.   Patient Self Care Activities:  . Patient will check blood glucose daily , document, and provide at future appointments . Patient will take medications as prescribed . Patient will report any questions or concerns to provider   Please see past updates related to this goal by clicking on the "Past Updates" button in the selected goal        Depression Screen PHQ 2/9 Scores 03/11/2020 03/05/2019 02/18/2019 11/04/2017 09/16/2017 10/30/2016 10/19/2016  PHQ - 2 Score 0 0 0 0 0 0 0  PHQ- 9 Score - - 0 - - 2 -    Fall Risk Fall Risk  03/11/2020 03/05/2019 11/04/2017 09/16/2017 10/30/2016  Falls in the past year? 1 0 No No No  Comment tripped, passed out due to low BP - - - -  Number falls in past yr: 1 0 - - -  Injury with Fall? 1 0 - - -  Comment broke cheek bone - - - -  Risk for fall due to : History of fall(s);Medication side effect - - - -  Follow up Falls evaluation completed;Education provided;Falls prevention discussed - - - -  Any stairs in or around the home? Yes  If so, are there any without handrails? No  Home free of loose throw rugs in walkways, pet beds, electrical cords, etc? Yes  Adequate lighting in your home to reduce risk of falls? Yes   ASSISTIVE DEVICES UTILIZED TO PREVENT FALLS:  Life alert? No  Use of a cane, walker or w/c? No  Grab bars in the bathroom? Yes  Shower chair or bench in shower? Yes  Elevated toilet seat or a handicapped toilet? Yes   TIMED UP AND  GO:  Was the test performed? Yes .   Cognitive Function:     6CIT Screen 03/11/2020 02/18/2019 11/04/2017  What Year? 0 points 0 points 0 points  What month? 0 points 0 points 0 points  What time? 0 points 0 points 0 points  Count back from 20 0 points 0 points 0 points  Months in reverse 0 points 0 points 0 points  Repeat phrase 2 points 0 points 0 points  Total Score 2 0 0    Immunizations Immunization History  Administered Date(s) Administered  . Fluad Quad(high Dose 65+) 01/28/2019, 02/16/2020  . Influenza, High Dose Seasonal PF 05/10/2016, 03/15/2017, 01/07/2018  . Influenza-Unspecified 03/15/2017  . Moderna SARS-COVID-2 Vaccination 06/12/2019, 07/11/2019  . Pneumococcal Conjugate-13 10/30/2013  . Pneumococcal Polysaccharide-23 06/10/2017  . Td 10/31/2010  . Zoster 10/30/2013  . Zoster Recombinat (Shingrix) 05/02/2018, 07/21/2018    TDAP status: Up to date Flu Vaccine status: Up to date Pneumococcal vaccine status: Up to date Covid-19 vaccine status: Completed vaccines  Qualifies for Shingles Vaccine? Yes   Zostavax completed Yes   Shingrix Completed?: Yes  Screening Tests Health Maintenance  Topic Date Due  . OPHTHALMOLOGY EXAM  04/16/2020  . HEMOGLOBIN A1C  08/16/2020  . URINE MICROALBUMIN  08/16/2020  . TETANUS/TDAP  10/30/2020  . FOOT EXAM  11/15/2020  . COLONOSCOPY  10/28/2024  . INFLUENZA VACCINE  Completed  . COVID-19 Vaccine  Completed  . Hepatitis C Screening  Completed  . PNA vac Low Risk Adult  Completed    Health Maintenance  There are no preventive care reminders to display for this patient.  Colorectal cancer screening: Completed 10/29/2014. Repeat every 10 years  Lung Cancer Screening: (Low Dose CT Chest recommended if Age 36-80 years, 30 pack-year currently smoking OR have quit w/in 15years.) does not qualify.   Lung Cancer Screening Referral: no  Additional Screening:  Hepatitis C Screening: does qualify; Completed  06/10/2017  Vision Screening: Recommended annual ophthalmology exams for early detection of glaucoma and other disorders of the eye. Is the patient up to date with their annual eye exam?  Yes  Who is the provider or what is the name of the office in which the patient attends annual eye exams? [facility] If pt is not established with a provider, would they like to be referred to a provider to establish care? No .   Dental Screening: Recommended annual dental exams for proper oral hygiene  Community Resource Referral / Chronic Care Management: CRR required this visit?  No   CCM required this visit?  No      Plan:     I have personally reviewed and noted the following in the patient's chart:   . Medical and social history . Use of alcohol, tobacco or illicit drugs  . Current medications and supplements . Functional ability and status . Nutritional status . Physical activity . Advanced directives . List of other physicians .  Hospitalizations, surgeries, and ER visits in previous 12 months . Vitals . Screenings to include cognitive, depression, and falls . Referrals and appointments  In addition, I have reviewed and discussed with patient certain preventive protocols, quality metrics, and best practice recommendations. A written personalized care plan for preventive services as well as general preventive health recommendations were provided to patient.     Kellie Simmering, LPN   [id number]   Nurse Notes:

## 2020-03-11 NOTE — Patient Instructions (Signed)
Johnny Wolfe , Thank you for taking time to come for your Medicare Wellness Visit. I appreciate your ongoing commitment to your health goals. Please review the following plan we discussed and let me know if I can assist you in the future.   Screening recommendations/referrals: Colonoscopy: completed 10/29/2014 Recommended yearly ophthalmology/optometry visit for glaucoma screening and checkup Recommended yearly dental visit for hygiene and checkup  Vaccinations: Influenza vaccine: completed 02/16/2020, due 11/28/2020 Pneumococcal vaccine: completed 06/10/2017 Tdap vaccine: completed 10/31/2010, due 10/30/2020 Shingles vaccine: completed   Covid-19:  06/12/2019, 07/11/2019, 02/22/2020  Advanced directives: copy in chart  Conditions/risks identified: none  Next appointment: Follow up in one year for your annual wellness visit.   Preventive Care 47 Years and Older, Male Preventive care refers to lifestyle choices and visits with your health care provider that can promote health and wellness. What does preventive care include?  A yearly physical exam. This is also called an annual well check.  Dental exams once or twice a year.  Routine eye exams. Ask your health care provider how often you should have your eyes checked.  Personal lifestyle choices, including:  Daily care of your teeth and gums.  Regular physical activity.  Eating a healthy diet.  Avoiding tobacco and drug use.  Limiting alcohol use.  Practicing safe sex.  Taking low doses of aspirin every day.  Taking vitamin and mineral supplements as recommended by your health care provider. What happens during an annual well check? The services and screenings done by your health care provider during your annual well check will depend on your age, overall health, lifestyle risk factors, and family history of disease. Counseling  Your health care provider may ask you questions about your:  Alcohol use.  Tobacco use.  Drug  use.  Emotional well-being.  Home and relationship well-being.  Sexual activity.  Eating habits.  History of falls.  Memory and ability to understand (cognition).  Work and work Statistician. Screening  You may have the following tests or measurements:  Height, weight, and BMI.  Blood pressure.  Lipid and cholesterol levels. These may be checked every 5 years, or more frequently if you are over 70 years old.  Skin check.  Lung cancer screening. You may have this screening every year starting at age 63 if you have a 30-pack-year history of smoking and currently smoke or have quit within the past 15 years.  Fecal occult blood test (FOBT) of the stool. You may have this test every year starting at age 5.  Flexible sigmoidoscopy or colonoscopy. You may have a sigmoidoscopy every 5 years or a colonoscopy every 10 years starting at age 70.  Prostate cancer screening. Recommendations will vary depending on your family history and other risks.  Hepatitis C blood test.  Hepatitis B blood test.  Sexually transmitted disease (STD) testing.  Diabetes screening. This is done by checking your blood sugar (glucose) after you have not eaten for a while (fasting). You may have this done every 1-3 years.  Abdominal aortic aneurysm (AAA) screening. You may need this if you are a current or former smoker.  Osteoporosis. You may be screened starting at age 44 if you are at high risk. Talk with your health care provider about your test results, treatment options, and if necessary, the need for more tests. Vaccines  Your health care provider may recommend certain vaccines, such as:  Influenza vaccine. This is recommended every year.  Tetanus, diphtheria, and acellular pertussis (Tdap, Td) vaccine. You may  need a Td booster every 10 years.  Zoster vaccine. You may need this after age 23.  Pneumococcal 13-valent conjugate (PCV13) vaccine. One dose is recommended after age  10.  Pneumococcal polysaccharide (PPSV23) vaccine. One dose is recommended after age 44. Talk to your health care provider about which screenings and vaccines you need and how often you need them. This information is not intended to replace advice given to you by your health care provider. Make sure you discuss any questions you have with your health care provider. Document Released: 05/13/2015 Document Revised: 01/04/2016 Document Reviewed: 02/15/2015 Elsevier Interactive Patient Education  2017 [place] Prevention in the Home Falls can cause injuries. They can happen to people of all ages. There are many things you can do to make your home safe and to help prevent falls. What can I do on the outside of my home?  Regularly fix the edges of walkways and driveways and fix any cracks.  Remove anything that might make you trip as you walk through a door, such as a raised step or threshold.  Trim any bushes or trees on the path to your home.  Use bright outdoor lighting.  Clear any walking paths of anything that might make someone trip, such as rocks or tools.  Regularly check to see if handrails are loose or broken. Make sure that both sides of any steps have handrails.  Any raised decks and porches should have guardrails on the edges.  Have any leaves, snow, or ice cleared regularly.  Use sand or salt on walking paths during winter.  Clean up any spills in your garage right away. This includes oil or grease spills. What can I do in the bathroom?  Use night lights.  Install grab bars by the toilet and in the tub and shower. Do not use towel bars as grab bars.  Use non-skid mats or decals in the tub or shower.  If you need to sit down in the shower, use a plastic, non-slip stool.  Keep the floor dry. Clean up any water that spills on the floor as soon as it happens.  Remove soap buildup in the tub or shower regularly.  Attach bath mats securely with double-sided  non-slip rug tape.  Do not have throw rugs and other things on the floor that can make you trip. What can I do in the bedroom?  Use night lights.  Make sure that you have a light by your bed that is easy to reach.  Do not use any sheets or blankets that are too big for your bed. They should not hang down onto the floor.  Have a firm chair that has side arms. You can use this for support while you get dressed.  Do not have throw rugs and other things on the floor that can make you trip. What can I do in the kitchen?  Clean up any spills right away.  Avoid walking on wet floors.  Keep items that you use a lot in easy-to-reach places.  If you need to reach something above you, use a strong step stool that has a grab bar.  Keep electrical cords out of the way.  Do not use floor polish or wax that makes floors slippery. If you must use wax, use non-skid floor wax.  Do not have throw rugs and other things on the floor that can make you trip. What can I do with my stairs?  Do not leave any items on  the stairs.  Make sure that there are handrails on both sides of the stairs and use them. Fix handrails that are broken or loose. Make sure that handrails are as long as the stairways.  Check any carpeting to make sure that it is firmly attached to the stairs. Fix any carpet that is loose or worn.  Avoid having throw rugs at the top or bottom of the stairs. If you do have throw rugs, attach them to the floor with carpet tape.  Make sure that you have a light switch at the top of the stairs and the bottom of the stairs. If you do not have them, ask someone to add them for you. What else can I do to help prevent falls?  Wear shoes that:  Do not have high heels.  Have rubber bottoms.  Are comfortable and fit you well.  Are closed at the toe. Do not wear sandals.  If you use a stepladder:  Make sure that it is fully opened. Do not climb a closed stepladder.  Make sure that both  sides of the stepladder are locked into place.  Ask someone to hold it for you, if possible.  Clearly mark and make sure that you can see:  Any grab bars or handrails.  First and last steps.  Where the edge of each step is.  Use tools that help you move around (mobility aids) if they are needed. These include:  Canes.  Walkers.  Scooters.  Crutches.  Turn on the lights when you go into a dark area. Replace any light bulbs as soon as they burn out.  Set up your furniture so you have a clear path. Avoid moving your furniture around.  If any of your floors are uneven, fix them.  If there are any pets around you, be aware of where they are.  Review your medicines with your doctor. Some medicines can make you feel dizzy. This can increase your chance of falling. Ask your doctor what other things that you can do to help prevent falls. This information is not intended to replace advice given to you by your health care provider. Make sure you discuss any questions you have with your health care provider. Document Released: 02/10/2009 Document Revised: 09/22/2015 Document Reviewed: 05/21/2014 Elsevier Interactive Patient Education  2017 [employer].

## 2020-03-14 ENCOUNTER — Other Ambulatory Visit: Payer: Self-pay | Admitting: Nurse Practitioner

## 2020-03-14 NOTE — Telephone Encounter (Signed)
Requested medication (s) are due for refill today: Yes  Requested medication (s) are on the active medication list: Yes  Last refill:  02/19/19  Future visit scheduled: Yes  Notes to clinic:  Unable to refill per protocol, expired Rx     Requested Prescriptions  Pending Prescriptions Disp Refills   metFORMIN (GLUCOPHAGE) 1000 MG tablet [Pharmacy Med Name: metFORMIN HCl 1000 MG Oral Tablet] 180 tablet 3    Sig: TAKE 1 TABLET BY MOUTH  TWICE DAILY WITH A MEAL      Endocrinology:  Diabetes - Biguanides Passed - 03/14/2020 11:47 AM      Passed - Cr in normal range and within 360 days    Creatinine, Ser  Date Value Ref Range Status  11/16/2019 0.78 0.76 - 1.27 mg/dL Final          Passed - HBA1C is between 0 and 7.9 and within 180 days    HB A1C (BAYER DCA - WAIVED)  Date Value Ref Range Status  02/16/2020 6.3 <7.0 % Final    Comment:                                          Diabetic Adult            <7.0                                       Healthy Adult        4.3 - 5.7                                                           (DCCT/NGSP) American Diabetes Association's Summary of Glycemic Recommendations for Adults with Diabetes: Hemoglobin A1c <7.0%. More stringent glycemic goals (A1c <6.0%) may further reduce complications at the cost of increased risk of hypoglycemia.           Passed - eGFR in normal range and within 360 days    GFR calc Af Amer  Date Value Ref Range Status  11/16/2019 105 >59 mL/min/1.73 Final    Comment:    **[facility] currently reports eGFR in compliance with the current**   recommendations of the [employer]. [facility] will   update reporting as new guidelines are published from the NKF-ASN   Task force.    GFR calc non Af Amer  Date Value Ref Range Status  11/16/2019 91 >59 mL/min/1.73 Final          Passed - Valid encounter within last 6 months    Recent Outpatient Visits           3 weeks ago Type 2 diabetes  mellitus with hyperglycemia, with long-term current use of insulin ([place])   [place], Jolene T, NP   3 months ago Type 2 diabetes mellitus without complication, with long-term current use of insulin ([place])   [place], Jolene T, NP   7 months ago Type 2 diabetes mellitus without complication, with long-term current use of insulin ([place])   [place], [place] T, NP   8 months ago Syncope, unspecified syncope  type   [facility] [place], Henrine Screws T, NP   8 months ago Syncope, unspecified syncope type   [place], Barbaraann Faster, NP       Future Appointments             In 2 months [place], Barbaraann Faster, NP [facility]   In 12 months  [facility]

## 2020-03-15 ENCOUNTER — Other Ambulatory Visit: Payer: Self-pay | Admitting: Nurse Practitioner

## 2020-03-15 DIAGNOSIS — I25118 Atherosclerotic heart disease of native coronary artery with other forms of angina pectoris: Secondary | ICD-10-CM

## 2020-03-15 NOTE — Telephone Encounter (Signed)
Routing to provider. Last RX said no print.

## 2020-03-15 NOTE — Telephone Encounter (Signed)
Requested medication (s) are due for refill today: yes  Requested medication (s) are on the active medication list: yes  Last refill:  07/28/19 #90 by a different provider  Future visit scheduled: yes  Notes to clinic:  Please review for refill. Previously filed by another provider. Patient requesting 1 year supply    Requested Prescriptions  Pending Prescriptions Disp Refills   metoprolol tartrate (LOPRESSOR) 25 MG tablet [Pharmacy Med Name: Metoprolol Tartrate 25 MG Oral Tablet] 90 tablet 3    Sig: TAKE ONE-HALF TABLET BY  MOUTH TWICE DAILY      Cardiovascular:  Beta Blockers Passed - 03/15/2020  1:27 PM      Passed - Last BP in normal range    BP Readings from Last 1 Encounters:  02/16/20 128/79          Passed - Last Heart Rate in normal range    Pulse Readings from Last 1 Encounters:  02/16/20 63          Passed - Valid encounter within last 6 months    Recent Outpatient Visits           4 weeks ago Type 2 diabetes mellitus with hyperglycemia, with long-term current use of insulin ([place])   [place], Jolene T, NP   4 months ago Type 2 diabetes mellitus without complication, with long-term current use of insulin ([place])   [place], Jolene T, NP   7 months ago Type 2 diabetes mellitus without complication, with long-term current use of insulin ([place])   [place], [place] T, NP   8 months ago Syncope, unspecified syncope type   [facility] [place], Jolene T, NP   8 months ago Syncope, unspecified syncope type   [place], Barbaraann Faster, NP       Future Appointments             In 2 months Cannady, Barbaraann Faster, NP [facility]   In 71 months  [facility]

## 2020-03-30 ENCOUNTER — Other Ambulatory Visit: Payer: Self-pay | Admitting: Nurse Practitioner

## 2020-04-18 DIAGNOSIS — E119 Type 2 diabetes mellitus without complications: Secondary | ICD-10-CM | POA: Diagnosis not present

## 2020-04-18 LAB — HM DIABETES EYE EXAM

## 2020-05-19 ENCOUNTER — Other Ambulatory Visit: Payer: Self-pay

## 2020-05-19 ENCOUNTER — Ambulatory Visit (INDEPENDENT_AMBULATORY_CARE_PROVIDER_SITE_OTHER): Payer: PPO | Admitting: Nurse Practitioner

## 2020-05-19 ENCOUNTER — Encounter: Payer: Self-pay | Admitting: Nurse Practitioner

## 2020-05-19 VITALS — BP 121/68 | HR 70 | Temp 98.0°F | Ht 72.44 in | Wt 162.8 lb

## 2020-05-19 DIAGNOSIS — E1165 Type 2 diabetes mellitus with hyperglycemia: Secondary | ICD-10-CM | POA: Diagnosis not present

## 2020-05-19 DIAGNOSIS — I700 Atherosclerosis of aorta: Secondary | ICD-10-CM

## 2020-05-19 DIAGNOSIS — I25119 Atherosclerotic heart disease of native coronary artery with unspecified angina pectoris: Secondary | ICD-10-CM

## 2020-05-19 DIAGNOSIS — I152 Hypertension secondary to endocrine disorders: Secondary | ICD-10-CM

## 2020-05-19 DIAGNOSIS — N62 Hypertrophy of breast: Secondary | ICD-10-CM

## 2020-05-19 DIAGNOSIS — E1159 Type 2 diabetes mellitus with other circulatory complications: Secondary | ICD-10-CM

## 2020-05-19 DIAGNOSIS — E1169 Type 2 diabetes mellitus with other specified complication: Secondary | ICD-10-CM | POA: Diagnosis not present

## 2020-05-19 DIAGNOSIS — E875 Hyperkalemia: Secondary | ICD-10-CM

## 2020-05-19 DIAGNOSIS — E785 Hyperlipidemia, unspecified: Secondary | ICD-10-CM | POA: Diagnosis not present

## 2020-05-19 DIAGNOSIS — Z794 Long term (current) use of insulin: Secondary | ICD-10-CM

## 2020-05-19 LAB — MICROALBUMIN, URINE WAIVED
Creatinine, Urine Waived: 100 mg/dL (ref 10–300)
Microalb, Ur Waived: 30 mg/L — ABNORMAL HIGH (ref 0–19)
Microalb/Creat Ratio: <30 mg/g (ref <30)

## 2020-05-19 LAB — BAYER DCA HB A1C WAIVED: HB A1C (BAYER DCA - WAIVED): 6.1 % (ref <7.0)

## 2020-05-19 MED ORDER — METFORMIN HCL 1000 MG PO TABS: ORAL_TABLET | ORAL | 4 refills | Status: DC

## 2020-05-19 MED ORDER — ATORVASTATIN CALCIUM 40 MG PO TABS
ORAL_TABLET | ORAL | 4 refills | Status: DC
Start: 2020-05-19 — End: 2020-08-01

## 2020-05-19 MED ORDER — INSULIN GLARGINE 100 UNIT/ML SOLOSTAR PEN
15.0000 [IU] | PEN_INJECTOR | Freq: Every day | SUBCUTANEOUS | 4 refills | Status: DC
Start: 2020-05-19 — End: 2020-09-07

## 2020-05-19 MED ORDER — METOPROLOL TARTRATE 25 MG PO TABS: 12.5000 mg | ORAL_TABLET | Freq: Two times a day (BID) | ORAL | 4 refills | Status: DC

## 2020-05-19 NOTE — Assessment & Plan Note (Signed)
Chronic, ongoing.  Continue current medication regimen and adjust as needed.  Current LDL well controlled.  Lipid panel today.

## 2020-05-19 NOTE — Assessment & Plan Note (Addendum)
Chronic, ongoing.  BP at goal today.  Continue current medication regimen and collaboration with cardiology.  Adjust as needed.  Recommend he continue to monitor BP occasionally at home and document + focus on DASH diet.  BMP today.  Return in 3 months.

## 2020-05-19 NOTE — Assessment & Plan Note (Signed)
Noted to be trending down last visit, recheck today and consider Valtessa per cardiology recommendation if ongoing elevation.  No current ACE or ARB present.  He has been focused on lowering K+ in diet and increasing fluids.

## 2020-05-19 NOTE — Assessment & Plan Note (Signed)
Noted on past imaging, continue statin and ASA daily for prevention.

## 2020-05-19 NOTE — Assessment & Plan Note (Signed)
Chronic, stable with no recent NTG use.  Continue current medication regimen and collaboration with cardiology. 

## 2020-05-19 NOTE — Progress Notes (Signed)
BP 121/68   Pulse 70   Temp 98 F (36.7 C) (Oral)   Ht 6' 0.44" (1.84 m)   Wt 162 lb 12.8 oz (73.8 kg)   SpO2 98%   BMI 21.81 kg/m    Subjective:    Patient ID: Johnny Wolfe, male    DOB: 01-19-1946, 75 y.o.   MRN: [id number]  HPI: Johnny Wolfe is a 75 y.o. male  Chief Complaint  Patient presents with  . Diabetes  . Pain under skin    Patient states that he is experiencing pain under the skin under b/l nipples. For past few momths. Noticed all of the sudden, at times it gets worse, today he is fine.   DIABETES Last A1C 6.3% in October.  Continues on Metformin 1000 MG BID and Lantus 15 units.  Have tried GLP1 and SGLT, but this was not beneficial.  He lost weight with changes in medications and felt tired, feels better and is gaining weight back on insulin. Glutamic acid <5.0 and C peptide 2.1 this year on check -- ?Type 1 1/2.  During holidays endorses eating a little more. Hypoglycemic episodes:no Polydipsia/polyuria:no Visual disturbance:no Chest pain:no Paresthesias:no Glucose Monitoring:yes Accucheck frequency: Daily Fasting glucose: often in 100 range, but some elevations noted Post prandial:  Evening: Before meals: Taking Insulin?:no Long acting insulin: Lantus 15 units Short acting insulin: Blood Pressure Monitoring:daily Retinal Examination:Up to Date Foot Exam:Up to Date Pneumovax:Up to Date Influenza:Up to Date Aspirin:yes  HYPERTENSION / HYPERLIPIDEMIA Continues on ASA and Metoprolol + Lipitor. Last saw cardiology 11/03/2019 and K+ level trending up on labs -- they discussed diet with him and if ongoing may need to add on Valtessa --- recent K+ on 02/16/20 5.3.  Never had used NTG.  Has history of aortic atherosclerosis noted on past imaging. Satisfied with current treatment? yes Duration of hypertension: chronic BP monitoring frequency: not checking BP range:  not checking BP medication side effects: no Duration of hyperlipidemia: chronic Cholesterol medication side effects: no Cholesterol supplements: none Medication compliance: good compliance Aspirin: yes Recent stressors: no Recurrent headaches: no Visual changes: no Palpitations: no Dyspnea: no Chest pain: no Lower extremity edema: no Dizzy/lightheaded: no  BREAST ENLARGEMENT Noted 2 months -- sometimes this is sensitive, but no pain to bilateral breasts.  Does get itchy x 1.  No changes, remaining stable Duration : 2 months Redness: no Swelling: no Trauma: no trauma Breast lump: no Status: stable Treatments attempted: none  Relevant past medical, surgical, family and social history reviewed and updated as indicated. Interim medical history since our last visit reviewed. Allergies and medications reviewed and updated.  Review of Systems  Constitutional: Negative for activity change, diaphoresis, fatigue and fever.  Respiratory: Negative for cough, chest tightness, shortness of breath and wheezing.   Cardiovascular: Negative for chest pain, palpitations and leg swelling.  Gastrointestinal: Negative.   Endocrine: Negative for polydipsia, polyphagia and polyuria.  Neurological: Negative.   Psychiatric/Behavioral: Negative.     Per HPI unless specifically indicated above     Objective:    BP 121/68   Pulse 70   Temp 98 F (36.7 C) (Oral)   Ht 6' 0.44" (1.84 m)   Wt 162 lb 12.8 oz (73.8 kg)   SpO2 98%   BMI 21.81 kg/m   Wt Readings from Last 3 Encounters:  05/19/20 162 lb 12.8 oz (73.8 kg)  03/11/20 160 lb (72.6 kg)  02/16/20 157 lb 12.8 oz (71.6 kg)    Physical Exam Vitals and nursing  note reviewed.  Constitutional:      General: He is awake. He is not in acute distress.    Appearance: He is well-developed and well-groomed. He is not ill-appearing.  HENT:     Head: Normocephalic and atraumatic.     Right Ear: Hearing normal. No drainage.     Left Ear:  Hearing normal. No drainage.  Eyes:     General: Lids are normal.        Right eye: No discharge.        Left eye: No discharge.     Conjunctiva/sclera: Conjunctivae normal.     Pupils: Pupils are equal, round, and reactive to light.  Neck:     Vascular: No carotid bruit.  Cardiovascular:     Rate and Rhythm: Normal rate and regular rhythm.     Heart sounds: Normal heart sounds, S1 normal and S2 normal. No murmur heard. No gallop.   Pulmonary:     Effort: Pulmonary effort is normal. No accessory muscle usage or respiratory distress.     Breath sounds: Normal breath sounds.  Chest:  Breasts:     Right: Swelling present. No mass, nipple discharge, skin change, tenderness, axillary adenopathy or supraclavicular adenopathy.     Left: Swelling present. No mass, nipple discharge, skin change, tenderness, axillary adenopathy or supraclavicular adenopathy.      Comments: Mild swelling to nipple area bilateral with firmness underneath -- no masses. Abdominal:     General: Bowel sounds are normal.     Palpations: Abdomen is soft.  Musculoskeletal:        General: Normal range of motion.     Cervical back: Normal range of motion and neck supple.     Right lower leg: No edema.     Left lower leg: No edema.  Lymphadenopathy:     Upper Body:     Right upper body: No supraclavicular, axillary or pectoral adenopathy.     Left upper body: No supraclavicular, axillary or pectoral adenopathy.  Skin:    General: Skin is warm and dry.  Neurological:     Mental Status: He is alert and oriented to person, place, and time.  Psychiatric:        Attention and Perception: Attention normal.        Mood and Affect: Mood normal.        Speech: Speech normal.        Behavior: Behavior normal. Behavior is cooperative.        Thought Content: Thought content normal.     Results for orders placed or performed in visit on 04/20/20  HM DIABETES EYE EXAM  Result Value Ref Range   HM Diabetic Eye Exam No  Retinopathy No Retinopathy      Assessment & Plan:   Problem List Items Addressed This Visit      Cardiovascular and Mediastinum   Hypertension associated with diabetes ([place])    Chronic, ongoing.  BP at goal today.  Continue current medication regimen and collaboration with cardiology.  Adjust as needed.  Recommend he continue to monitor BP occasionally at home and document + focus on DASH diet.  BMP today.  Return in 3 months.       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  Continue current medication regimen and collaboration with cardiology.      Relevant Medications   atorvastatin (LIPITOR) 40 MG tablet   metoprolol tartrate (LOPRESSOR) 25 MG tablet   Aortic atherosclerosis (HCC)    Noted on past imaging, continue statin and ASA daily for prevention.      Relevant Medications   atorvastatin (LIPITOR) 40 MG tablet   metoprolol tartrate (LOPRESSOR) 25 MG tablet     Endocrine   Type 2 diabetes mellitus with hyperglycemia (HCC) - Primary    Chronic, ongoing with A1C continuing at goal = 6.1%. 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  Continue to monitor BS at home and a post prandial check.  Urine ALB check today.  Return in 3 months.      Relevant Medications   insulin glargine (LANTUS) 100 UNIT/ML Solostar Pen   atorvastatin (LIPITOR) 40 MG tablet   metFORMIN (GLUCOPHAGE) 1000 MG tablet   Other Relevant Orders   Bayer DCA Hb A1c Waived (STAT)   Lipid Profile   Basic Metabolic Panel (BMET)   Microalbumin, Urine Waived   Hyperlipidemia associated with type 2 diabetes mellitus (HCC)     Chronic, ongoing.  Continue current medication regimen and adjust as needed.  Current LDL well controlled.  Lipid panel today.      Relevant Medications   insulin glargine (LANTUS) 100 UNIT/ML Solostar Pen   atorvastatin (LIPITOR) 40 MG tablet   metFORMIN (GLUCOPHAGE) 1000 MG tablet     Other   Hyperkalemia    Noted to be trending down last visit, recheck today and consider Valtessa per cardiology recommendation if ongoing elevation.  No current ACE or ARB present.  He has been focused on lowering K+ in diet and increasing fluids.        Gynecomastia    Bilateral nipple area, discussed with patient.  No pain at this time or change in size.  Will defer imaging at this time per request and if any changes then will notify PCP and will obtain.  Educated on this finding.          Follow up plan: Return in about 3 months (around 08/17/2020) for T2DM, HTN/HLD.

## 2020-05-19 NOTE — Assessment & Plan Note (Signed)
Chronic, ongoing with A1C continuing at goal = 6.1%. 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  Continue to monitor BS at home and a post prandial check.  Urine ALB check today.  Return in 3 months.

## 2020-05-19 NOTE — Patient Instructions (Signed)
Gynecomastia, Adult Gynecomastia is an overgrowth of gland tissue in a man's breasts. This may cause one or both breasts to become enlarged. The condition often develops in men who have an imbalance of the male sex hormone (testosterone) and the male sex hormone (estrogen). This means that a man may have too much estrogen, too little testosterone, or both. Gynecomastia may be a normal part of aging for some men. It can also happen to adolescent boys during puberty. What are the causes? This condition may be caused by:  Certain medicines, such as: ? Estrogen supplements and medicines that act like estrogen in the body. ? Medicines that keep testosterone from functioning normally in the body (testosterone-inhibiting drugs). ? Anabolic steroids. ? Medicines to treat heartburn, cancer, heart disease, mental health problems, HIV, or AIDS. ? Antibiotic medicine. ? Chemotherapy medicine.  Recreational drugs, including alcohol, marijuana, and opioids.  Herbal products, including lavender and tea tree oil.  A gene that is passed from parent to child (inherited).  Certain medical conditions, such as: ? Tumors in the pituitary or adrenal gland. ? An overactive thyroid gland. ? Certain inherited disorders, including a genetic disease that causes low testosterone in males (Klinefelter syndrome). ? Cancer of the lung, kidney, liver, testicle, or gastrointestinal tract. ? Conditions that cause liver or kidney failure. ? Poor nutrition and starvation. ? Testicle shrinking or failure (testicularatrophy). In some cases, the cause may not be known. What increases the risk? The following factors may make you more likely to develop this condition:  Being 36 years old or older.  Being overweight.  Abusing alcohol or other drugs.  Having a family history of gynecomastia. What are the signs or symptoms? In most cases, breast enlargement is the only symptom. The enlargement may start near the nipple,  and the breast tissue may feel firm and rubbery. Other symptoms may include:  Pain or tenderness in the breasts.  Itchy breasts. How is this diagnosed? This condition may be diagnosed based on:  Your symptoms and medical history.  A physical exam.  Imaging tests, such as: ? An ultrasound. ? A mammogram. ? An MRI.  Blood tests.  Removal of a sample of breast tissue to be tested in a lab (biopsy). How is this treated? This condition may go away on its own, without treatment. If gynecomastia is caused by a medical problem or drug abuse, treatment may include:  Getting treatment for the underlying medical problem or for drug abuse.  Changing or stopping medicines.  Medicines to block the effects of estrogen.  Taking a testosterone replacement.  Surgery to remove breast tissue or any lumps in your breasts.  Breast reduction surgery. This may be an option if you have severe or painful gynecomastia. Follow these instructions at home:  Take over-the-counter and prescription medicines only as told by your health care provider.  Talk to your health care provider before taking any herbal medicines or diet supplements.  Do not abuse drugs or alcohol.  Keep all follow-up visits as told by your health care provider. This is important.   Contact a health care provider if:  Your breast tissue grows larger or gets more swollen or painful.  You have a lump in your testicle.  You have blood or discharge coming from your nipples.  Your nipple changes shape.  You develop a hard or painful lump in your breast. Summary  Gynecomastia is an overgrowth of gland tissue in a man's breasts. This may cause one or both breasts to  become enlarged.  In most cases, breast enlargement is the only symptom. The enlargement may start near the nipple, and the breast tissue may feel firm and rubbery.  This condition may go away on its own, without treatment. In some cases, treatment for an  underlying medical problem or for drug abuse may be needed.  Take over-the-counter and prescription medicines only as told by your health care provider.  Do not abuse drugs or alcohol. This information is not intended to replace advice given to you by your health care provider. Make sure you discuss any questions you have with your health care provider. Document Revised: 10/09/2018 Document Reviewed: 10/09/2018 Elsevier Patient Education  [place].

## 2020-05-19 NOTE — Assessment & Plan Note (Signed)
Bilateral nipple area, discussed with patient.  No pain at this time or change in size.  Will defer imaging at this time per request and if any changes then will notify PCP and will obtain.  Educated on this finding.

## 2020-05-20 LAB — LIPID PANEL
Chol/HDL Ratio: 1.8 ratio (ref 0.0–5.0)
Cholesterol, Total: 144 mg/dL (ref 100–199)
HDL: 79 mg/dL (ref >39)
LDL Chol Calc (NIH): 52 mg/dL (ref 0–99)
Triglycerides: 67 mg/dL (ref 0–149)
VLDL Cholesterol Cal: 13 mg/dL (ref 5–40)

## 2020-05-20 LAB — BASIC METABOLIC PANEL
BUN/Creatinine Ratio: 20 (ref 10–24)
BUN: 19 mg/dL (ref 8–27)
CO2: 22 mmol/L (ref 20–29)
Calcium: 9.7 mg/dL (ref 8.6–10.2)
Chloride: 98 mmol/L (ref 96–106)
Creatinine, Ser: 0.94 mg/dL (ref 0.76–1.27)
GFR calc Af Amer: 93 mL/min/{1.73_m2} (ref >59)
GFR calc non Af Amer: 81 mL/min/{1.73_m2} (ref >59)
Glucose: 82 mg/dL (ref 65–99)
Potassium: 5.8 mmol/L — ABNORMAL HIGH (ref 3.5–5.2)
Sodium: 136 mmol/L (ref 134–144)

## 2020-05-20 NOTE — Progress Notes (Signed)
Contacted via [place] morning Jermayne, your labs have returned.  Your cholesterol levels are at goal.  Kidney function is stable.  Potassium is creeping up again at 5.8 now.  I know we have chatted and Dr. Rockey Situ has chatted with you about Patiromer Daryll Drown) I feel like we may need to start a low dose of this and see how you do.  I know you have tried diet changes, but this continues to trend higher.  Would you like to try starting this?  If so I will send in and recheck level in one week on outpatient labs. Keep being awesome!!  Thank you for allowing me to participate in your care. Kindest regards, Shaeleigh Graw

## 2020-05-23 ENCOUNTER — Other Ambulatory Visit: Payer: Self-pay | Admitting: Nurse Practitioner

## 2020-05-23 DIAGNOSIS — E875 Hyperkalemia: Secondary | ICD-10-CM

## 2020-05-28 ENCOUNTER — Other Ambulatory Visit: Payer: Self-pay | Admitting: Nurse Practitioner

## 2020-05-28 NOTE — Telephone Encounter (Signed)
Requested Prescriptions  Pending Prescriptions Disp Refills  . Magnesium Oxide 400 (240 Mg) MG TABS [Pharmacy Med Name: MAGNESIUM OXIDE 400 (240 MG) MG TAB] 180 tablet 0    Sig: TAKE 1 TABLET BY MOUTH TWICE DAILY     Endocrinology:  Minerals - Magnesium Supplementation Passed - 05/28/2020 11:21 AM      Passed - Mg Level in normal range and within 360 days    Magnesium  Date Value Ref Range Status  02/16/2020 1.6 1.6 - 2.3 mg/dL Final         Passed - Valid encounter within last 12 months    Recent Outpatient Visits          1 week ago Type 2 diabetes mellitus with hyperglycemia, with long-term current use of insulin ([place])   [place], Jolene T, NP   3 months ago Type 2 diabetes mellitus with hyperglycemia, with long-term current use of insulin ([place])   [place], Jolene T, NP   6 months ago Type 2 diabetes mellitus without complication, with long-term current use of insulin ([place])   [place], Jolene T, NP   9 months ago Type 2 diabetes mellitus without complication, with long-term current use of insulin ([place])   [place], [place] T, NP   11 months ago Syncope, unspecified syncope type   [place], Barbaraann Faster, NP      Future Appointments            In 2 months [place], Barbaraann Faster, NP [facility]   In 9 months  [facility]

## 2020-06-06 ENCOUNTER — Other Ambulatory Visit: Payer: Medicare Other

## 2020-06-06 ENCOUNTER — Other Ambulatory Visit: Payer: Self-pay

## 2020-06-06 DIAGNOSIS — E875 Hyperkalemia: Secondary | ICD-10-CM

## 2020-06-07 ENCOUNTER — Other Ambulatory Visit: Payer: Self-pay | Admitting: Nurse Practitioner

## 2020-06-07 DIAGNOSIS — E875 Hyperkalemia: Secondary | ICD-10-CM

## 2020-06-07 LAB — POTASSIUM: Potassium: 6 mmol/L — ABNORMAL HIGH (ref 3.5–5.2)

## 2020-06-07 LAB — MAGNESIUM: Magnesium: 1.8 mg/dL (ref 1.6–2.3)

## 2020-06-07 MED ORDER — PATIROMER SORBITEX CALCIUM 8.4 G PO PACK: 8.4000 g | PACK | Freq: Every day | ORAL | 3 refills | Status: DC

## 2020-06-07 NOTE — Addendum Note (Signed)
Addended by: Marnee Guarneri T on: 06/07/2020 10:39 AM   Modules accepted: Orders

## 2020-06-07 NOTE — Progress Notes (Signed)
Contacted via MyChart  Good morning Alarik, so I do have some bad news. Your potassium level is actually trending up to 6 now, I do recommend we start Veltassa and recheck this in one week. I will send this into pharmacy. I am awaiting magnesium level. If ongoing issue with this I may send you to kidney provider, even though your kidney labs have been fantastic. Any questions? Keep being amazing!! Thank you for allowing me to participate in your care. Kindest regards, Kaedance Magos

## 2020-06-07 NOTE — Progress Notes (Signed)
Contacted via MyChart

## 2020-06-16 ENCOUNTER — Other Ambulatory Visit: Payer: Self-pay

## 2020-06-16 ENCOUNTER — Other Ambulatory Visit: Payer: Medicare Other

## 2020-06-16 DIAGNOSIS — E875 Hyperkalemia: Secondary | ICD-10-CM

## 2020-06-17 ENCOUNTER — Other Ambulatory Visit: Payer: Self-pay | Admitting: Nurse Practitioner

## 2020-06-17 DIAGNOSIS — E875 Hyperkalemia: Secondary | ICD-10-CM

## 2020-06-17 LAB — BASIC METABOLIC PANEL
BUN/Creatinine Ratio: 17 (ref 10–24)
BUN: 14 mg/dL (ref 8–27)
CO2: 23 mmol/L (ref 20–29)
Calcium: 9.2 mg/dL (ref 8.6–10.2)
Chloride: 102 mmol/L (ref 96–106)
Creatinine, Ser: 0.81 mg/dL (ref 0.76–1.27)
GFR calc Af Amer: 103 mL/min/{1.73_m2} (ref >59)
GFR calc non Af Amer: 89 mL/min/{1.73_m2} (ref >59)
Glucose: 87 mg/dL (ref 65–99)
Potassium: 5.1 mmol/L (ref 3.5–5.2)
Sodium: 139 mmol/L (ref 134–144)

## 2020-06-17 NOTE — Progress Notes (Signed)
Contacted via MyChart   Good morning Johnny Wolfe, your Johnny Wolfe is working.  I was hoping not to have to add it, but it appears it is needed.  Has brought your level from 6 to 5.1 now and kidney function remains very stable.  I would continue this for now and then would like you to come back for lab only visit in one month to recheck this and make sure level does not get too low either.  Any questions? Keep being awesome!!  Thank you for allowing me to participate in your care. Kindest regards, Jerral Mccauley

## 2020-07-14 ENCOUNTER — Other Ambulatory Visit: Payer: Self-pay

## 2020-07-14 ENCOUNTER — Other Ambulatory Visit: Payer: Medicare Other

## 2020-07-14 DIAGNOSIS — E875 Hyperkalemia: Secondary | ICD-10-CM | POA: Diagnosis not present

## 2020-07-15 LAB — POTASSIUM: Potassium: 4.9 mmol/L (ref 3.5–5.2)

## 2020-07-15 NOTE — Progress Notes (Signed)
Contacted via MyChart   Good afternoon Johnny Wolfe, well your potassium continues to be stable with addition of Patiromer, unfortunately at this time I would continue this and we will continue to monitor at upcoming visits.  At this time it is staying stable and not getting too low.  Any questions? Keep being excellent!!  Thank you for allowing me to participate in your care. Kindest regards, Shatika Grinnell

## 2020-08-01 ENCOUNTER — Other Ambulatory Visit: Payer: Self-pay | Admitting: Cardiovascular Disease

## 2020-08-01 NOTE — Telephone Encounter (Signed)
Rx request sent to pharmacy.  

## 2020-08-17 ENCOUNTER — Ambulatory Visit: Payer: PPO | Admitting: Nurse Practitioner

## 2020-08-19 ENCOUNTER — Ambulatory Visit (INDEPENDENT_AMBULATORY_CARE_PROVIDER_SITE_OTHER): Payer: Medicare Other | Admitting: Nurse Practitioner

## 2020-08-19 ENCOUNTER — Encounter: Payer: Self-pay | Admitting: Nurse Practitioner

## 2020-08-19 VITALS — BP 111/64 | HR 76 | Temp 97.4°F | Wt 166.0 lb

## 2020-08-19 DIAGNOSIS — I152 Hypertension secondary to endocrine disorders: Secondary | ICD-10-CM

## 2020-08-19 DIAGNOSIS — Z794 Long term (current) use of insulin: Secondary | ICD-10-CM | POA: Diagnosis not present

## 2020-08-19 DIAGNOSIS — E785 Hyperlipidemia, unspecified: Secondary | ICD-10-CM | POA: Diagnosis not present

## 2020-08-19 DIAGNOSIS — I25119 Atherosclerotic heart disease of native coronary artery with unspecified angina pectoris: Secondary | ICD-10-CM | POA: Diagnosis not present

## 2020-08-19 DIAGNOSIS — E1165 Type 2 diabetes mellitus with hyperglycemia: Secondary | ICD-10-CM

## 2020-08-19 DIAGNOSIS — I700 Atherosclerosis of aorta: Secondary | ICD-10-CM | POA: Diagnosis not present

## 2020-08-19 DIAGNOSIS — E875 Hyperkalemia: Secondary | ICD-10-CM

## 2020-08-19 DIAGNOSIS — E1169 Type 2 diabetes mellitus with other specified complication: Secondary | ICD-10-CM | POA: Diagnosis not present

## 2020-08-19 DIAGNOSIS — E1159 Type 2 diabetes mellitus with other circulatory complications: Secondary | ICD-10-CM | POA: Diagnosis not present

## 2020-08-19 MED ORDER — ATORVASTATIN CALCIUM 40 MG PO TABS: 40.0000 mg | ORAL_TABLET | Freq: Every day | ORAL | 4 refills | Status: DC

## 2020-08-19 NOTE — Assessment & Plan Note (Signed)
Chronic, ongoing with A1C continuing at goal last visit = 6.1%. 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  Continue to monitor BS at home and a post prandial check.  Urine ALB check up to date in January.  Check A1c outpatient.  Return in 3 months.

## 2020-08-19 NOTE — Patient Instructions (Signed)
Diabetes Mellitus and Nutrition, Adult When you have diabetes, or diabetes mellitus, it is very important to have healthy eating habits because your blood sugar (glucose) levels are greatly affected by what you eat and drink. Eating healthy foods in the right amounts, at about the same times every day, can help you:  Control your blood glucose.  Lower your risk of heart disease.  Improve your blood pressure.  Reach or maintain a healthy weight. What can affect my meal plan? Every person with diabetes is different, and each person has different needs for a meal plan. Your health care provider may recommend that you work with a dietitian to make a meal plan that is best for you. Your meal plan may vary depending on factors such as:  The calories you need.  The medicines you take.  Your weight.  Your blood glucose, blood pressure, and cholesterol levels.  Your activity level.  Other health conditions you have, such as heart or kidney disease. How do carbohydrates affect me? Carbohydrates, also called carbs, affect your blood glucose level more than any other type of food. Eating carbs naturally raises the amount of glucose in your blood. Carb counting is a method for keeping track of how many carbs you eat. Counting carbs is important to keep your blood glucose at a healthy level, especially if you use insulin or take certain oral diabetes medicines. It is important to know how many carbs you can safely have in each meal. This is different for every person. Your dietitian can help you calculate how many carbs you should have at each meal and for each snack. How does alcohol affect me? Alcohol can cause a sudden decrease in blood glucose (hypoglycemia), especially if you use insulin or take certain oral diabetes medicines. Hypoglycemia can be a life-threatening condition. Symptoms of hypoglycemia, such as sleepiness, dizziness, and confusion, are similar to symptoms of having too much  alcohol.  Do not drink alcohol if: ? Your health care provider tells you not to drink. ? You are pregnant, may be pregnant, or are planning to become pregnant.  If you drink alcohol: ? Do not drink on an empty stomach. ? Limit how much you use to:  0-1 drink a day for women.  0-2 drinks a day for men. ? Be aware of how much alcohol is in your drink. In the [place] one drink equals one 12 oz bottle of beer (355 mL), one 5 oz glass of wine (148 mL), or one 1 oz glass of hard liquor (44 mL). ? Keep yourself hydrated with water, diet soda, or unsweetened iced tea.  Keep in mind that regular soda, juice, and other mixers may contain a lot of sugar and must be counted as carbs. What are tips for following this plan? Reading food labels  Start by checking the serving size on the "Nutrition Facts" label of packaged foods and drinks. The amount of calories, carbs, fats, and other nutrients listed on the label is based on one serving of the item. Many items contain more than one serving per package.  Check the total grams (g) of carbs in one serving. You can calculate the number of servings of carbs in one serving by dividing the total carbs by 15. For example, if a food has 30 g of total carbs per serving, it would be equal to 2 servings of carbs.  Check the number of grams (g) of saturated fats and trans fats in one serving. Choose foods that have   a low amount or none of these fats.  Check the number of milligrams (mg) of salt (sodium) in one serving. Most people should limit total sodium intake to less than 2,300 mg per day.  Always check the nutrition information of foods labeled as "low-fat" or "nonfat." These foods may be higher in added sugar or refined carbs and should be avoided.  Talk to your dietitian to identify your daily goals for nutrients listed on the label. Shopping  Avoid buying canned, pre-made, or processed foods. These foods tend to be high in fat, sodium, and added  sugar.  Shop around the outside edge of the grocery store. This is where you will most often find fresh fruits and vegetables, bulk grains, fresh meats, and fresh dairy. Cooking  Use low-heat cooking methods, such as baking, instead of high-heat cooking methods like deep frying.  Cook using healthy oils, such as olive, canola, or sunflower oil.  Avoid cooking with butter, cream, or high-fat meats. Meal planning  Eat meals and snacks regularly, preferably at the same times every day. Avoid going long periods of time without eating.  Eat foods that are high in fiber, such as fresh fruits, vegetables, beans, and whole grains. Talk with your dietitian about how many servings of carbs you can eat at each meal.  Eat 4-6 oz (112-168 g) of lean protein each day, such as lean meat, chicken, fish, eggs, or tofu. One ounce (oz) of lean protein is equal to: ? 1 oz (28 g) of meat, chicken, or fish. ? 1 egg. ?  cup (62 g) of tofu.  Eat some foods each day that contain healthy fats, such as avocado, nuts, seeds, and fish.   What foods should I eat? Fruits Berries. Apples. Oranges. Peaches. Apricots. Plums. Grapes. Mango. Papaya. Pomegranate. Kiwi. Cherries. Vegetables Lettuce. Spinach. Leafy greens, including kale, chard, collard greens, and mustard greens. Beets. Cauliflower. Cabbage. Broccoli. Carrots. Green beans. Tomatoes. Peppers. Onions. Cucumbers. Brussels sprouts. Grains Whole grains, such as whole-wheat or whole-grain bread, crackers, tortillas, cereal, and pasta. Unsweetened oatmeal. Quinoa. Brown or wild rice. Meats and other proteins Seafood. Poultry without skin. Lean cuts of poultry and beef. Tofu. Nuts. Seeds. Dairy Low-fat or fat-free dairy products such as milk, yogurt, and cheese. The items listed above may not be a complete list of foods and beverages you can eat. Contact a dietitian for more information. What foods should I avoid? Fruits Fruits canned with  syrup. Vegetables Canned vegetables. Frozen vegetables with butter or cream sauce. Grains Refined white flour and flour products such as bread, pasta, snack foods, and cereals. Avoid all processed foods. Meats and other proteins Fatty cuts of meat. Poultry with skin. Breaded or fried meats. Processed meat. Avoid saturated fats. Dairy Full-fat yogurt, cheese, or milk. Beverages Sweetened drinks, such as soda or iced tea. The items listed above may not be a complete list of foods and beverages you should avoid. Contact a dietitian for more information. Questions to ask a health care provider  Do I need to meet with a diabetes educator?  Do I need to meet with a dietitian?  What number can I call if I have questions?  When are the best times to check my blood glucose? Where to find more information:  American Diabetes Association: diabetes.org  Academy of Nutrition and Dietetics: www.eatright.org  National Institute of Diabetes and Digestive and Kidney Diseases: www.niddk.nih.gov  Association of Diabetes Care and Education Specialists: www.diabeteseducator.org Summary  It is important to have healthy eating   habits because your blood sugar (glucose) levels are greatly affected by what you eat and drink.  A healthy meal plan will help you control your blood glucose and maintain a healthy lifestyle.  Your health care provider may recommend that you work with a dietitian to make a meal plan that is best for you.  Keep in mind that carbohydrates (carbs) and alcohol have immediate effects on your blood glucose levels. It is important to count carbs and to use alcohol carefully. This information is not intended to replace advice given to you by your health care provider. Make sure you discuss any questions you have with your health care provider. Document Revised: 03/24/2019 Document Reviewed: 03/24/2019 Elsevier Patient Education  2021 Elsevier Inc.  

## 2020-08-19 NOTE — Assessment & Plan Note (Signed)
Chronic, stable with no recent NTG use.  Continue current medication regimen and collaboration with cardiology. 

## 2020-08-19 NOTE — Assessment & Plan Note (Signed)
Chronic, ongoing.  BP at goal today on home reading.  Continue current medication regimen and collaboration with cardiology.  Adjust as needed.  Recommend he continue to monitor BP occasionally at home and document + focus on DASH diet.  CMP outpatient.  Return in 3 months.

## 2020-08-19 NOTE — Progress Notes (Signed)
BP 111/64   Pulse 76   Temp (!) 97.4 F (36.3 C) (Oral)   Wt 166 lb (75.3 kg)   BMI 22.24 kg/m    Subjective:    Patient ID: Johnny Wolfe, male    DOB: 1945-12-17, 75 y.o.   MRN: [id number]  HPI: Johnny Wolfe is a 75 y.o. male  Chief Complaint  Patient presents with  . Diabetes  . Hyperlipidemia  . Hypertension    . This visit was completed via telephone due to the restrictions of the COVID-19 pandemic. All issues as above were discussed and addressed but no physical exam was performed. If it was felt that the patient should be evaluated in the office, they were directed there. The patient verbally consented to this visit. Patient was unable to complete an audio/visual visit due to Lack of equipment. Due to the catastrophic nature of the COVID-19 pandemic, this visit was done through audio contact only. . Location of the patient: home . Location of the provider: home . Those involved with this call:  . Provider: Marnee Guarneri, DNP . CMA: Irena Reichmann, CMA . Front Desk/Registration: Jill Side  . Time spent on call: 21 minutes on the phone discussing health concerns. 15 minutes total spent in review of patient's record and preparation of their chart.  . I verified patient identity using two factors (patient name and date of birth). Patient consents verbally to being seen via telemedicine visit today.    DIABETES Last A1C 6.1% in January.  Continues on Metformin 1000 MG BID and Lantus 15 units.  Have tried GLP1 and SGLT, but this was not beneficial.  He lost weight with changes in medications and felt tired, feels better and is gaining weight back on insulin. Glutamic acid <5.0 and C peptide 2.1 in 2021 -- ?Type 1 1/2.   Hypoglycemic episodes:no Polydipsia/polyuria:no Visual disturbance:no Chest pain:no Paresthesias:no Glucose Monitoring:yes Accucheck frequency: Daily Fasting glucose: often in 100 range, varies, highest is 120  range Post prandial:  Evening: Before meals: Taking Insulin?:no Long acting insulin: Lantus 15 units Short acting insulin: Blood Pressure Monitoring:daily Retinal Examination:Up to Date Foot Exam:Up to Date Pneumovax:Up to Date Influenza:Up to Date Aspirin:yes  HYPERTENSION / HYPERLIPIDEMIA Continues on ASA and Metoprolol + Lipitor. Last saw cardiology 11/03/2019 and K+ level trending up on labs -- they discussed diet with him and if ongoing may need to add on Veltassa.  Veltassa was started by PCP 2 months back and since this time K+ has trended down to 4.9 on 07/14/20.  Never has used NTG.  Has history of aortic atherosclerosis noted on past imaging. Satisfied with current treatment? yes Duration of hypertension: chronic BP monitoring frequency: not checking BP range: not checking BP medication side effects: no Duration of hyperlipidemia: chronic Cholesterol medication side effects: no Cholesterol supplements: none Medication compliance: good compliance Aspirin: yes Recent stressors: no Recurrent headaches: no Visual changes: no Palpitations: no Dyspnea: no Chest pain: no Lower extremity edema: no Dizzy/lightheaded: no  Relevant past medical, surgical, family and social history reviewed and updated as indicated. Interim medical history since our last visit reviewed. Allergies and medications reviewed and updated.  Review of Systems  Constitutional: Negative for activity change, diaphoresis, fatigue and fever.  Respiratory: Negative for cough, chest tightness, shortness of breath and wheezing.   Cardiovascular: Negative for chest pain, palpitations and leg swelling.  Gastrointestinal: Negative.   Endocrine: Negative for polydipsia, polyphagia and polyuria.  Neurological: Negative.   Psychiatric/Behavioral: Negative.     Per  HPI unless specifically indicated above     Objective:    BP 111/64   Pulse  76   Temp (!) 97.4 F (36.3 C) (Oral)   Wt 166 lb (75.3 kg)   BMI 22.24 kg/m   Wt Readings from Last 3 Encounters:  08/19/20 166 lb (75.3 kg)  05/19/20 162 lb 12.8 oz (73.8 kg)  03/11/20 160 lb (72.6 kg)    Physical Exam   Unable to perform due to telephone visit only.   Results for orders placed or performed in visit on 07/14/20  Potassium  Result Value Ref Range   Potassium 4.9 3.5 - 5.2 mmol/L      Assessment & Plan:   Problem List Items Addressed This Visit      Cardiovascular and Mediastinum   Hypertension associated with diabetes ([place])    Chronic, ongoing.  BP at goal today on home reading.  Continue current medication regimen and collaboration with cardiology.  Adjust as needed.  Recommend he continue to monitor BP occasionally at home and document + focus on DASH diet.  CMP outpatient.  Return in 3 months.       Relevant Medications   atorvastatin (LIPITOR) 40 MG tablet   Other Relevant Orders   Bayer DCA Hb A1c Waived   Comprehensive metabolic panel   TSH   Coronary artery disease involving native coronary artery with angina pectoris (HCC)    Chronic, stable with no recent NTG use.  Continue current medication regimen and collaboration with cardiology.      Relevant Medications   atorvastatin (LIPITOR) 40 MG tablet   Aortic atherosclerosis ([place])    Noted on past imaging, continue statin and ASA daily for prevention.      Relevant Medications   atorvastatin (LIPITOR) 40 MG tablet     Endocrine   Type 2 diabetes mellitus with hyperglycemia (HCC) - Primary    Chronic, ongoing with A1C continuing at goal last visit = 6.1%. 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  Continue to monitor BS at home and a post prandial check.  Urine ALB check up to date in January.  Check A1c outpatient.  Return in 3 months.      Relevant Medications   atorvastatin  (LIPITOR) 40 MG tablet   Other Relevant Orders   Bayer DCA Hb A1c Waived   Hyperlipidemia associated with type 2 diabetes mellitus (HCC)    Chronic, ongoing.  Continue current medication regimen and adjust as needed.  Current LDL well controlled.  Lipid panel up to date in January with LDL 52.      Relevant Medications   atorvastatin (LIPITOR) 40 MG tablet   Other Relevant Orders   Bayer DCA Hb A1c Waived     Other   Hyperkalemia    Noted to be trending down with addition of Veltassa, last level 4.9.  Will obtain outpatient labs and check CMP, aldosterone/renin, cortisol, and 24 hour urine protein to assess for other underlying causes of high levels, as even with strict diet changes and discontinuation of ACE he continued to run in higher range.  Consider referral to nephrology or endocrinology dependent on lab outcomes.  Discussed with patient.      Relevant Orders   Aldosterone + renin activity w/ ratio   Comprehensive metabolic panel   Cortisol   Protein, Urine, 24 hour      I discussed  the assessment and treatment plan with the patient. The patient was provided an opportunity to ask questions and all were answered. The patient agreed with the plan and demonstrated an understanding of the instructions.   The patient was advised to call back or seek an in-person evaluation if the symptoms worsen or if the condition fails to improve as anticipated.   I provided 21+ minutes of time during this encounter.  Follow up plan: Return in about 3 months (around 11/18/2020) for T2DM, HTN/HLD, HYPERKALEMIA.

## 2020-08-19 NOTE — Assessment & Plan Note (Signed)
Noted to be trending down with addition of Veltassa, last level 4.9.  Will obtain outpatient labs and check CMP, aldosterone/renin, cortisol, and 24 hour urine protein to assess for other underlying causes of high levels, as even with strict diet changes and discontinuation of ACE he continued to run in higher range.  Consider referral to nephrology or endocrinology dependent on lab outcomes.  Discussed with patient.

## 2020-08-19 NOTE — Assessment & Plan Note (Signed)
Chronic, ongoing.  Continue current medication regimen and adjust as needed.  Current LDL well controlled.  Lipid panel up to date in January with LDL 52.

## 2020-08-19 NOTE — Assessment & Plan Note (Signed)
Noted on past imaging, continue statin and ASA daily for prevention.

## 2020-08-23 ENCOUNTER — Ambulatory Visit: Payer: Medicare Other

## 2020-08-23 ENCOUNTER — Other Ambulatory Visit: Payer: Medicare Other

## 2020-08-23 ENCOUNTER — Other Ambulatory Visit: Payer: Self-pay

## 2020-08-23 DIAGNOSIS — I152 Hypertension secondary to endocrine disorders: Secondary | ICD-10-CM | POA: Diagnosis not present

## 2020-08-23 DIAGNOSIS — E1169 Type 2 diabetes mellitus with other specified complication: Secondary | ICD-10-CM

## 2020-08-23 DIAGNOSIS — Z794 Long term (current) use of insulin: Secondary | ICD-10-CM | POA: Diagnosis not present

## 2020-08-23 DIAGNOSIS — E875 Hyperkalemia: Secondary | ICD-10-CM

## 2020-08-23 DIAGNOSIS — E1159 Type 2 diabetes mellitus with other circulatory complications: Secondary | ICD-10-CM | POA: Diagnosis not present

## 2020-08-23 DIAGNOSIS — E1165 Type 2 diabetes mellitus with hyperglycemia: Secondary | ICD-10-CM | POA: Diagnosis not present

## 2020-08-23 LAB — BAYER DCA HB A1C WAIVED: HB A1C (BAYER DCA - WAIVED): 6.6 % (ref <7.0)

## 2020-08-24 ENCOUNTER — Other Ambulatory Visit: Payer: Medicare Other

## 2020-08-24 ENCOUNTER — Other Ambulatory Visit: Payer: Self-pay | Admitting: Nurse Practitioner

## 2020-08-24 DIAGNOSIS — E875 Hyperkalemia: Secondary | ICD-10-CM

## 2020-08-24 NOTE — Progress Notes (Signed)
Contacted via MyChart   Good evening Bralynn, your labs have some returned.  Awaiting a few more.  A1c remains stable at 6.6%, continue current medications.  Thyroid is normal.  Cortisol level is normal.  Kidney and liver function normal + potassium level is stable at 5.1.  I will let you know when the remainder return.  Any questions? Keep being amazing!!  Thank you for allowing me to participate in your care. Kindest regards, Maaliyah Adolph

## 2020-08-25 LAB — PROTEIN, URINE, 24 HOUR
Protein, 24H Urine: 209 mg/24 hr — ABNORMAL HIGH (ref 30–150)
Protein, Ur: 11.6 mg/dL

## 2020-08-25 NOTE — Progress Notes (Signed)
Contacted via MyChart   Good evening Johnny Wolfe, your 24 hour urine protein did show some mild elevation at 209.  I am waiting on Aldosterone levels to return and then we can see if need to send to nephrology.  :) Keep being awesome!!  Thank you for allowing me to participate in your care. Kindest regards, Carlyon Nolasco

## 2020-08-30 ENCOUNTER — Other Ambulatory Visit: Payer: Self-pay | Admitting: Nurse Practitioner

## 2020-08-30 LAB — ALDOSTERONE + RENIN ACTIVITY W/ RATIO
ALDOS/RENIN RATIO: 7.3 (ref 0.0–30.0)
ALDOSTERONE: 3.5 ng/dL (ref 0.0–30.0)
Renin: 0.478 ng/mL/hr (ref 0.167–5.380)

## 2020-08-30 LAB — COMPREHENSIVE METABOLIC PANEL
ALT: 8 IU/L (ref 0–44)
AST: 13 IU/L (ref 0–40)
Albumin/Globulin Ratio: 2.2 (ref 1.2–2.2)
Albumin: 4.4 g/dL (ref 3.7–4.7)
Alkaline Phosphatase: 102 IU/L (ref 44–121)
BUN/Creatinine Ratio: 21 (ref 10–24)
BUN: 19 mg/dL (ref 8–27)
Bilirubin Total: 0.7 mg/dL (ref 0.0–1.2)
CO2: 22 mmol/L (ref 20–29)
Calcium: 10 mg/dL (ref 8.6–10.2)
Chloride: 98 mmol/L (ref 96–106)
Creatinine, Ser: 0.89 mg/dL (ref 0.76–1.27)
Globulin, Total: 2 g/dL (ref 1.5–4.5)
Glucose: 92 mg/dL (ref 65–99)
Potassium: 5.1 mmol/L (ref 3.5–5.2)
Sodium: 138 mmol/L (ref 134–144)
Total Protein: 6.4 g/dL (ref 6.0–8.5)
eGFR: 91 mL/min/{1.73_m2} (ref >59)

## 2020-08-30 LAB — CORTISOL: Cortisol: 13.5 ug/dL

## 2020-08-30 LAB — TSH: TSH: 3.09 u[IU]/mL (ref 0.450–4.500)

## 2020-08-30 NOTE — Progress Notes (Signed)
Contacted via MyChart   Good evening Deunte, Aldosterone and Renin levels all normal.  Overall these labs have shown good functioning kidneys, I have low suspicion this is cause for your potassium levels being higher.  However, in future we could consider nephrology referral for further evaluation. Keep being awesome!!  Thank you for allowing me to participate in your care. Kindest regards, Drema Eddington

## 2020-08-30 NOTE — Telephone Encounter (Signed)
Scheduled 7/22

## 2020-08-30 NOTE — Telephone Encounter (Signed)
Requested medication (s) are due for refill today: no  Requested medication (s) are on the active medication list:  yes  Last refill:  08/03/2020  Future visit scheduled: yes  Notes to clinic:  Medication not assigned to a protocol, review manually For next fill if patient will continue to take  Requested Prescriptions  Pending Prescriptions Disp Refills   VELTASSA 8.4 g packet [Pharmacy Med Name: VELTASSA 8.4 GM PACK] 30 each 3    Sig: TAKE THE CONTENTS OF 1 PACKET DAILY AS DIRECTED      Off-Protocol Failed - 08/30/2020 11:22 AM      Failed - Medication not assigned to a protocol, review manually.      Passed - Valid encounter within last 12 months    Recent Outpatient Visits           1 week ago Type 2 diabetes mellitus with hyperglycemia, with long-term current use of insulin ([place])   [place], Jolene T, NP   3 months ago Type 2 diabetes mellitus with hyperglycemia, with long-term current use of insulin ([place])   [place], Jolene T, NP   6 months ago Type 2 diabetes mellitus with hyperglycemia, with long-term current use of insulin ([place])   [place], Jolene T, NP   9 months ago Type 2 diabetes mellitus without complication, with long-term current use of insulin ([place])   [place], Castine T, NP   1 year ago Type 2 diabetes mellitus without complication, with long-term current use of insulin ([place])   [place], Braswell T, NP       Future Appointments             In 2 months [place], Barbaraann Faster, NP [facility]   In 6 months  [facility]              Signed Prescriptions Disp Refills   magnesium oxide (MAG-OX) 400 (240 Mg) MG tablet 180 tablet 0    Sig: TAKE 1 TABLET BY MOUTH TWICE DAILY      Endocrinology:  Minerals - Magnesium Supplementation Passed - 08/30/2020 11:22 AM      Passed - Mg Level in normal range and within 360  days    Magnesium  Date Value Ref Range Status  06/06/2020 1.8 1.6 - 2.3 mg/dL Final          Passed - Valid encounter within last 12 months    Recent Outpatient Visits           1 week ago Type 2 diabetes mellitus with hyperglycemia, with long-term current use of insulin ([place])   [place], Jolene T, NP   3 months ago Type 2 diabetes mellitus with hyperglycemia, with long-term current use of insulin ([place])   [place], Jolene T, NP   6 months ago Type 2 diabetes mellitus with hyperglycemia, with long-term current use of insulin ([place])   [place], Jolene T, NP   9 months ago Type 2 diabetes mellitus without complication, with long-term current use of insulin ([place])   [place], Imbler T, NP   1 year ago Type 2 diabetes mellitus without complication, with long-term current use of insulin ([place])   [place], Henrine Screws T, NP       Future Appointments             In 2 months  Venita Lick, NP [place] [facility]   In 6 months  [facility]

## 2020-09-07 ENCOUNTER — Other Ambulatory Visit: Payer: Self-pay | Admitting: Nurse Practitioner

## 2020-09-12 IMAGING — CT CT MAXILLOFACIAL W/O CM
3 series · 14 of 47 positions shown, 16 images · non-contrast
Comparison: 05/12/2019

CLINICAL DATA: Fall

EXAM:
CT HEAD WITHOUT CONTRAST
CT MAXILLOFACIAL WITHOUT CONTRAST
CT CERVICAL SPINE WITHOUT CONTRAST
TECHNIQUE: Multidetector CT imaging of the head, cervical spine, and
maxillofacial structures were performed using the standard protocol
without intravenous contrast. Multiplanar CT image reconstructions
of the cervical spine and maxillofacial structures were also
generated.

[Series 3: max soft · axial · 0.37mm/px · z∈[+88,+234]mm · 8 of 85 slices shown, 10 images]
[im 6/85  brain]
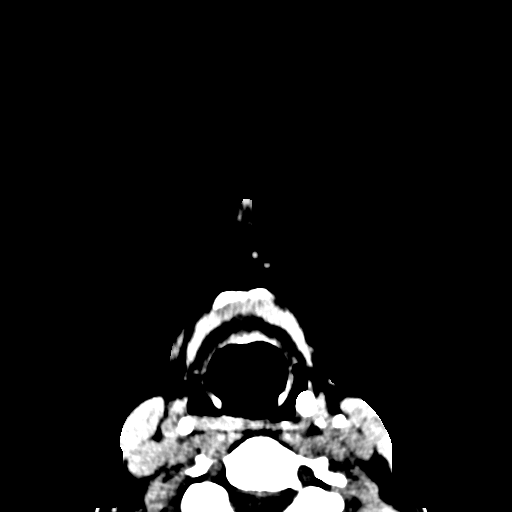
[im 6/85  bone]
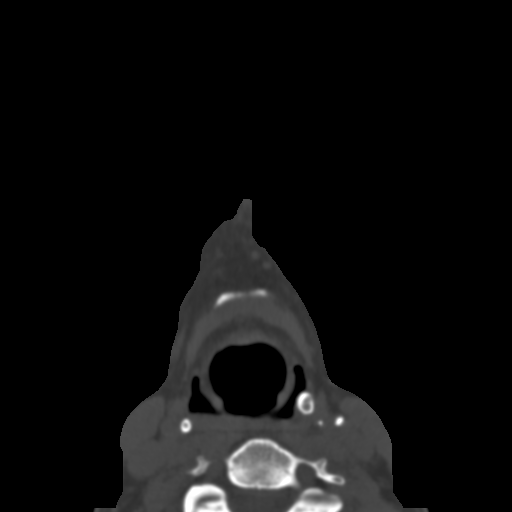
[im 18/85  bone]
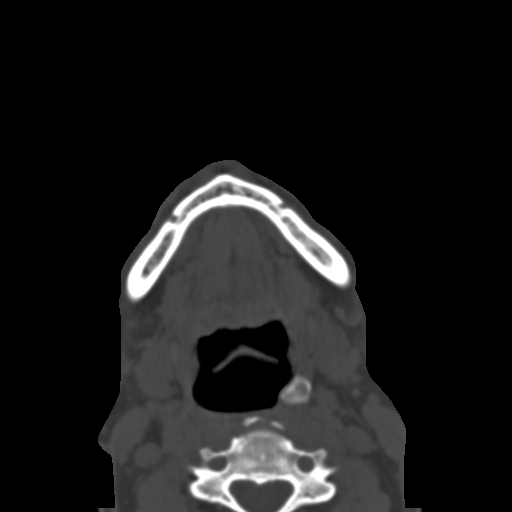
[im 27/85  bone]
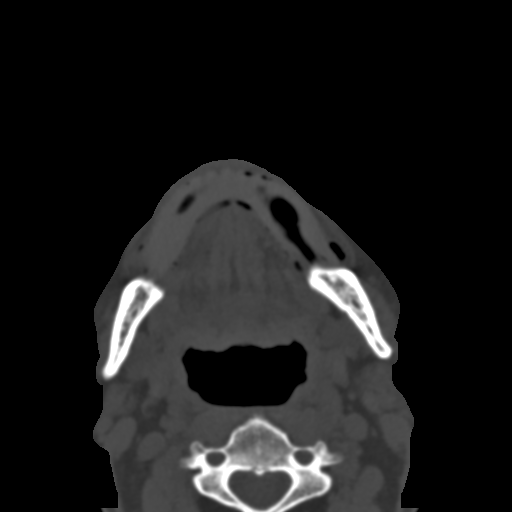
[im 38/85  bone]
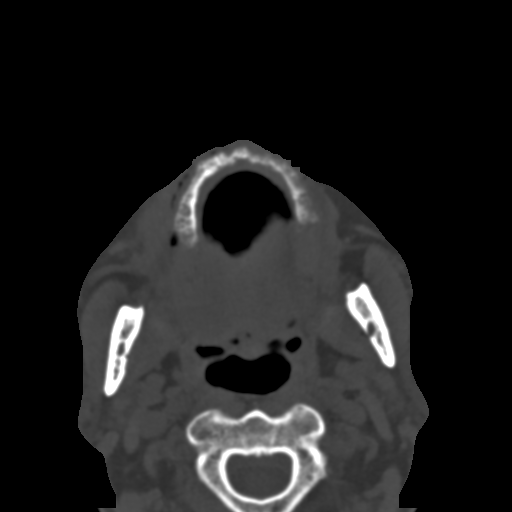
[im 47/85  brain]
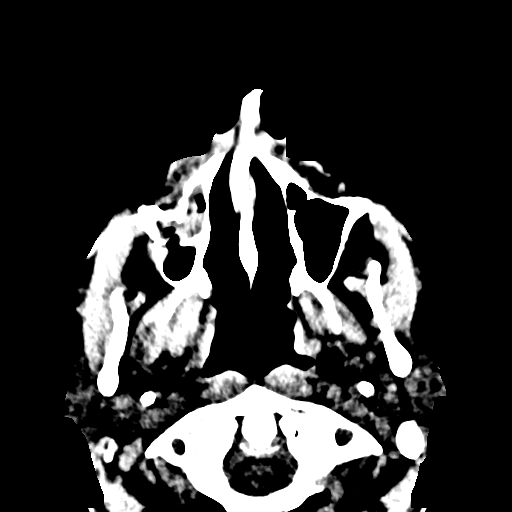
[im 47/85  bone]
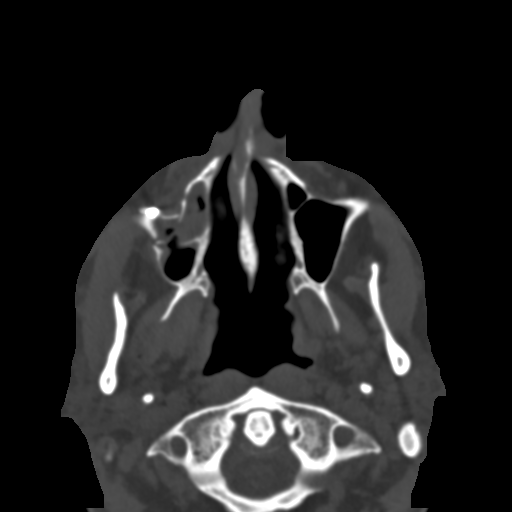
[im 58/85  bone]
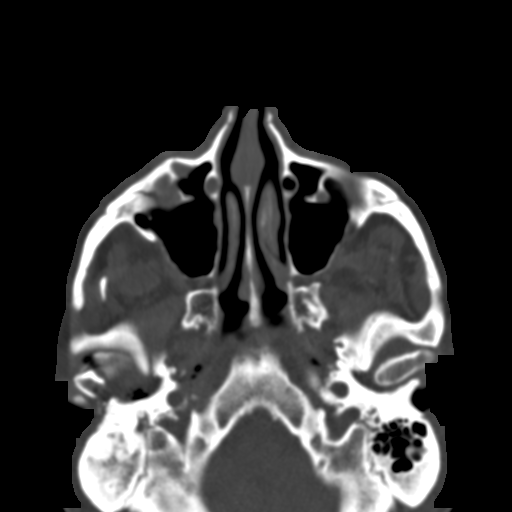
[im 67/85  bone]
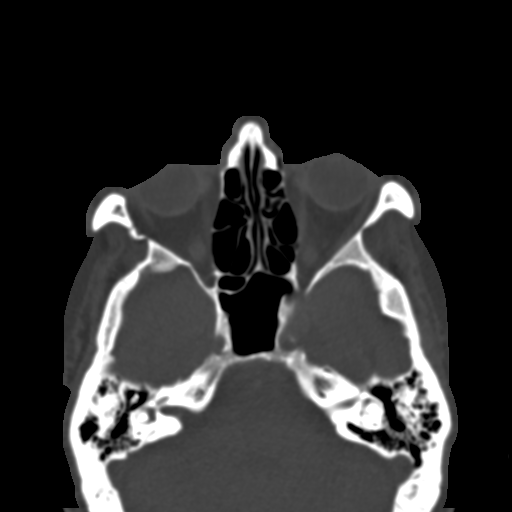
[im 79/85  bone]
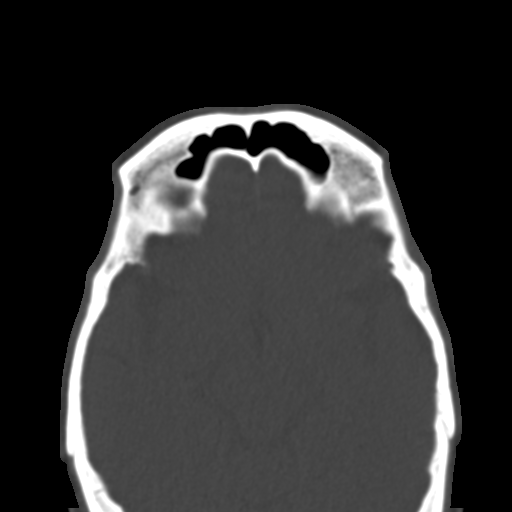

[Series 7: coronal soft · coronal · 0.36mm/px · 3 of 78 slices shown]
[im 26/78  bone]
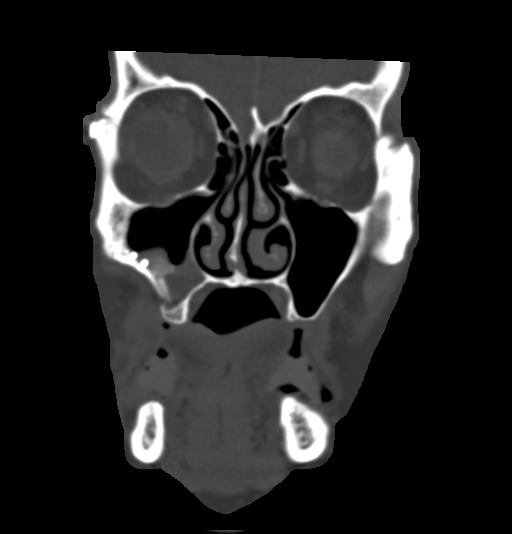
[im 35/78  bone]
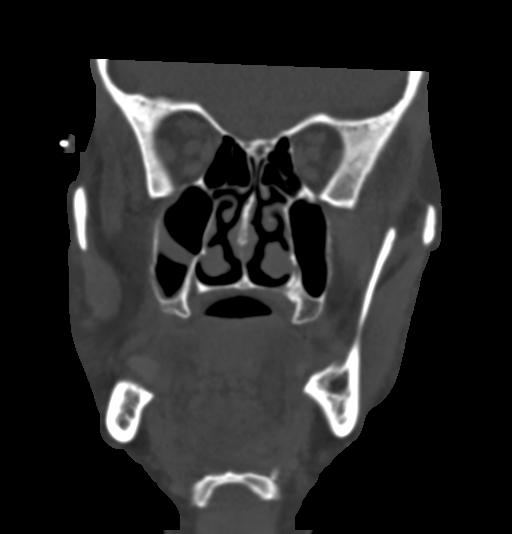
[im 43/78  bone]
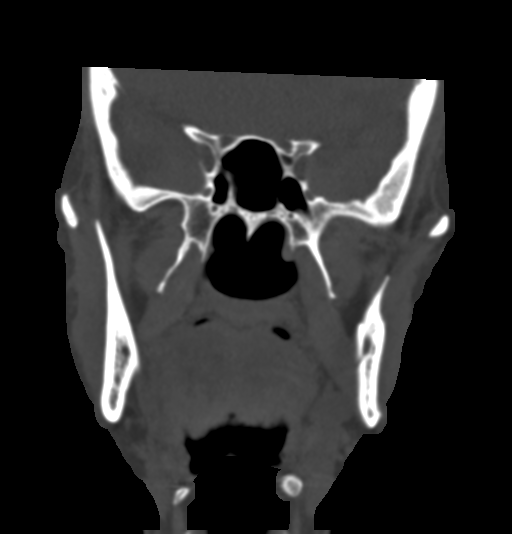

[Series 8: sagittal soft · sagittal · 0.32mm/px · 3 of 87 slices shown]
[im 29/87  bone]
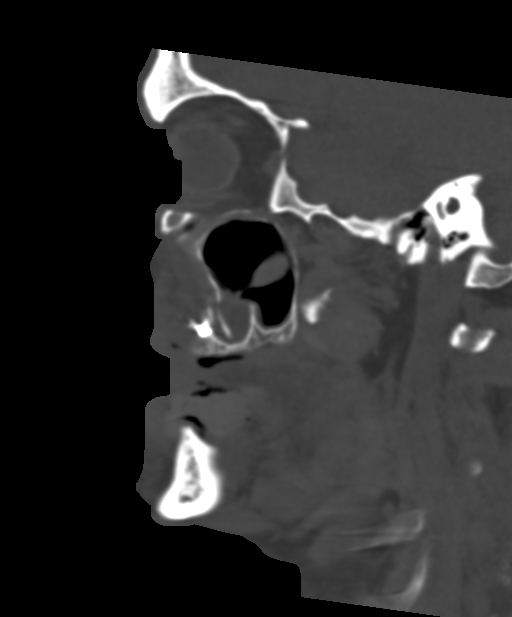
[im 44/87  bone]
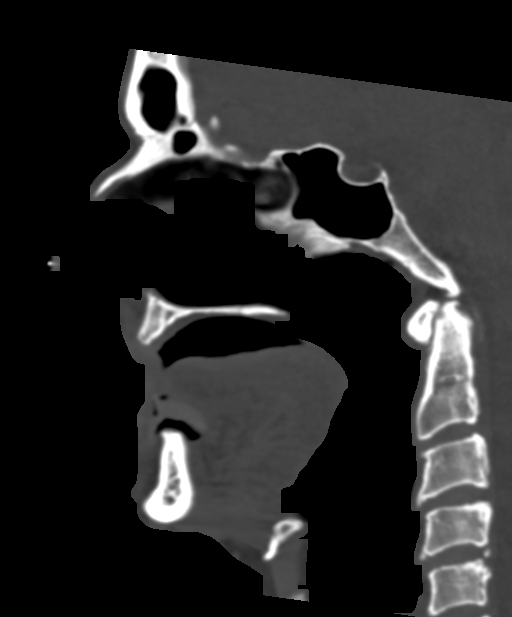
[im 58/87  bone]
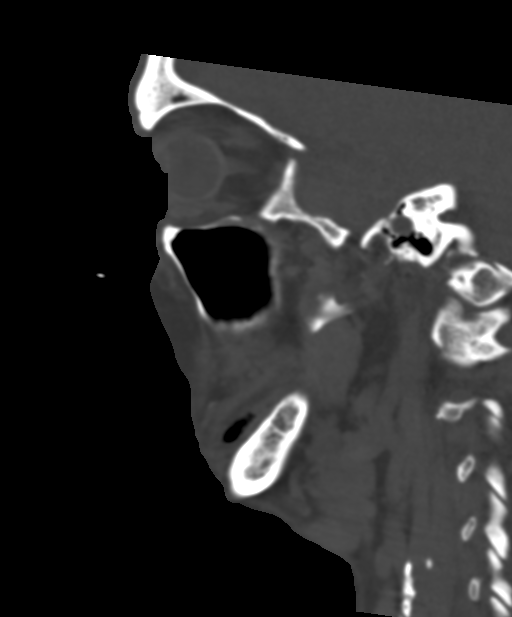

[14 of 47 positions shown; findings below may reference images not displayed]

FINDINGS: CT HEAD FINDINGS

Brain: There is no mass, hemorrhage or extra-axial collection. The
size and configuration of the ventricles and extra-axial CSF spaces
are normal. The brain parenchyma is normal, without evidence of
acute or chronic infarction.

Vascular: No abnormal hyperdensity of the major intracranial
arteries or dural venous sinuses. No intracranial atherosclerosis.

Skull: The visualized skull base, calvarium and extracranial soft
tissues are normal.

CT MAXILLOFACIAL FINDINGS

Osseous:

--Complex facial fracture types: Recently internally fixed right
zygomaticomaxillary complex fracture. Pterygoid plates are intact.
Left second medic arch is normal.

--Simple fracture types: None.

--Mandible: There is a comminuted fracture of the right mandibular
condyle, involving the condylar head. The condylar head is
inferiorly and medially displaced.

Orbits: Fractures of the right orbit are unchanged compared to
05/12/2019.

Sinuses: Moderate mucosal thickening in the right maxillary sinus.

Soft tissues: Normal visualized extracranial soft tissues.

CT CERVICAL SPINE FINDINGS

Alignment: No static subluxation. Facets are aligned. Occipital
condyles and the lateral masses of C1-C2 are aligned.

Skull base and vertebrae: No acute fracture.

Soft tissues and spinal canal: No prevertebral fluid or swelling. No
visible canal hematoma.

Disc levels: No advanced spinal canal or neural foraminal stenosis.

Upper chest: No pneumothorax, pulmonary nodule or pleural effusion.

Other: Normal visualized paraspinal cervical soft tissues.
IMPRESSION: 1. No acute intracranial abnormality.
2. Comminuted fracture of the right mandibular condyle involving the
condylar head. The condylar head is inferiorly and medially
displaced at the temporomandibular joint.
3. Recent ORIF of right zygomaticomaxillary complex fracture.
4. No acute fracture or static subluxation of the cervical spine.

## 2020-11-04 ENCOUNTER — Ambulatory Visit: Payer: Medicare Other | Admitting: Cardiovascular Disease

## 2020-11-04 ENCOUNTER — Encounter: Payer: Self-pay | Admitting: Cardiovascular Disease

## 2020-11-04 ENCOUNTER — Other Ambulatory Visit: Payer: Self-pay

## 2020-11-04 VITALS — BP 120/66 | HR 64 | Ht 72.0 in | Wt 165.5 lb

## 2020-11-04 DIAGNOSIS — E1165 Type 2 diabetes mellitus with hyperglycemia: Secondary | ICD-10-CM

## 2020-11-04 DIAGNOSIS — Z794 Long term (current) use of insulin: Secondary | ICD-10-CM

## 2020-11-04 DIAGNOSIS — I25119 Atherosclerotic heart disease of native coronary artery with unspecified angina pectoris: Secondary | ICD-10-CM | POA: Diagnosis not present

## 2020-11-04 DIAGNOSIS — I700 Atherosclerosis of aorta: Secondary | ICD-10-CM | POA: Diagnosis not present

## 2020-11-04 NOTE — Patient Instructions (Addendum)
Medication Instructions:  No changes, please continue your current medications   If you need a refill on your cardiac medications before your next appointment, please call your pharmacy.   Lab work: No new labs needed  Testing/Procedures: No new testing needed  Follow-Up: At [facility], you and your health needs are our priority.  As part of our continuing mission to provide you with exceptional heart care, we have created designated Provider Care Teams.  These Care Teams include your primary Cardiologist (physician) and Advanced Practice Providers (APPs -  Physician Assistants and Nurse Practitioners) who all work together to provide you with the care you need, when you need it.  You will need a follow up appointment in 12 months  Providers on your designated Care Team:   Murray Hodgkins, NP Christell Faith, PA-C Marrianne Mood, PA-C Cadence Olivet, [place]  COVID-19 Vaccine Information can be found at: [email or web address] For questions related to vaccine distribution or appointments, please email [email or web address] or call [phone].

## 2020-11-04 NOTE — Progress Notes (Signed)
Cardiology Office Note  Date:  11/04/2020   ID:  Johnny Wolfe, DOB 1945-06-27, MRN [id number]  PCP:  Venita Lick, NP   Chief Complaint  Patient presents with   12 month follow up     Patient c/o chest tightness, feeling fatigue and had some sharp pains in chest last week that lasted about a day. Medications reviewed by the patient verbally.     HPI:  Johnny Wolfe is a 75 yo old gentleman with past medical history of Diabetes,  Hyperkalemia on valtessa Hypertension Hyperlipidemia Smoker quit 2013 ETOH Coronary calcium score of 2310. Cardiac cath 08/02/2017 Severe proximal RCA disease, with stent placement Moderate proximal LAD and LCX disease Mild mid LAD, RCA disease, Vessels are heavily calcified. Presenting for follow-up of his syncope and CT coronary calcium scoring Who presents for follow-up of his coronary disease with stent to the RCA  Last office visit with myself July 2021 Active, mows, No regular walking program  Some fatigue, leg muscles tired, Rare left chest pain at rest, less like angina  Some travel, [employer] work reviewed, A1c 6.6, total cholesterol 144 LDL 52  EKG personally reviewed by myself on todays visit NSR rate 64 bpm  Echo at [facility] The left ventricle is normal in size with normal wall thickness. The left ventricular systolic function is normal, LVEF is visually estimated at > 55%. The right ventricle is normal in size, with normal systolic function.  Recent trip to [place], some hypersensitive to light Weight back up 10 pounds, Prior visit, had lost 20 pounds Less off orthostasis Has had med changes, off jardiance/ozympic  Other past medical history reviewed Fall, seen in the ER 05/12/2019  tripped on part of his rug, falling and striking the right side of his face on the floor. facial fracture requiring ORIF by Dr. Mardee Postin with ENT at [facility].   FAll, 06/18/2019, in the ER   syncopal episode at home which resulted in an injury to  his chin and jaw got up tonight and felt dizzy and is not quite certain what happened but woke up on the floor with an odd and "full" feeling in his right ear and some bleeding under his chin. Transferred to [facility] Has IVF 2 1/2 days  Cardiac cath 08/02/2017 Severe proximal RCA disease, with stent placement Moderate proximal LAD and LCX disease Mild mid LAD, RCA disease, Vessels are heavily calcified. Normal EF estimated at 55%  Coronary calcium score of 2310. This was 95 percentile for age and sex matched control. high calcium score.  severe heavy three-vessel disease  mild aortic atherosclerosis  Syncope: 05/21/2017 On [employer] to bed 7pm Woke up on the floor Remembered went to bathroom, woke up on floor, dark in room Then went to bathroom again,  Feeling for light Woke up again in the bathtub, little knot on head, Got up out of bathroom,  Little dizzy,  Sugars were 96  Father died 67s cancer Mother Obese, DM, cad, stent age 66, now age 52, now pacer Brother CAD, PCi  PMH:   has a past medical history of CAD (coronary artery disease), Cataract, Diabetes mellitus without complication ([place]), Essential hypertension, Hyperlipidemia, Syncope, and Wears dentures.  PSH:    Past Surgical History:  Procedure Laterality Date   APPENDECTOMY     CARDIAC CATHETERIZATION     CATARACT EXTRACTION, BILATERAL     August 2017   CORONARY STENT INTERVENTION N/A 08/02/2017   Procedure: CORONARY STENT INTERVENTION;  Surgeon:  Wellington Hampshire, MD;  Location: [place] [facility];  Service: Cardiovascular;  Laterality: N/A;   LEFT HEART CATH AND CORONARY ANGIOGRAPHY Left 08/02/2017   Procedure: LEFT HEART CATH AND CORONARY ANGIOGRAPHY;  Surgeon: Minna Merritts, MD;  Location: [place] [facility];  Service: Cardiovascular;  Laterality: Left;   MASS EXCISION Left 07/04/2017   Procedure: REMOVAL TWO CYST EAR EAR LOBE AND EXTERNAL EAR;  Surgeon: Beverly Gust, MD;  Location: [place];  Service: ENT;  Laterality: Left;  LOCAL Diabetic - insulin and oral meds   upper jaw Right 06/2019   at [facility]    Current Outpatient Medications  Medication Sig Dispense Refill   aspirin EC 81 MG tablet Take 81 mg by mouth daily.     atorvastatin (LIPITOR) 40 MG tablet Take 1 tablet (40 mg total) by mouth daily. Due for follow-up in July. 90 tablet 4   Lancet Device MISC 1 Units by Does not apply route daily as needed. 100 each 11   Lancets (ONETOUCH DELICA PLUS [id number]) MISC USE TO CHECK BLOOD SUGAR 2-3 TIMES DAILY 100 each 1   LANTUS SOLOSTAR 100 UNIT/ML Solostar Pen INJECT SUBCUTANEOUSLY 16  UNITS DAILY 15 mL 3   magnesium oxide (MAG-OX) 400 (240 Mg) MG tablet TAKE 1 TABLET BY MOUTH TWICE DAILY 180 tablet 0   metFORMIN (GLUCOPHAGE) 1000 MG tablet TAKE 1 TABLET BY MOUTH  TWICE DAILY WITH A MEAL 180 tablet 4   metoprolol tartrate (LOPRESSOR) 25 MG tablet Take 0.5 tablets (12.5 mg total) by mouth 2 (two) times daily. 90 tablet 4   nitroGLYCERIN (NITROSTAT) 0.4 MG SL tablet Place 1 tablet (0.4 mg total) under the tongue every 5 (five) minutes as needed for chest pain. 25 tablet 3   ONETOUCH VERIO test strip USE TO CHECK BLOOD SUGAR 2-3 TIMES PER DAY AS DIRECTED 100 each 1   VELTASSA 8.4 g packet TAKE THE CONTENTS OF 1 PACKET DAILY AS DIRECTED 30 each 3   vitamin B-12 (CYANOCOBALAMIN) 1000 MCG tablet Take 500 mcg by mouth daily.      No current facility-administered medications for this visit.     Allergies:   Ace inhibitors and Neosporin [neomycin-bacitracin zn-polymyx]   Social History:  The patient  reports that he quit smoking about 9 years ago. His smoking use included cigarettes. He smoked an average of 1.00 packs per day. He has never used smokeless tobacco. He reports current alcohol use of about 7.0 - 11.0 standard drinks of alcohol per week. He reports that he does not use drugs.   Family History:   family history includes Arrhythmia (age of onset: 80) in his mother; Cancer  in his father; Diabetes in his mother; Kidney cancer in his sister; Lung cancer in his sister.    Review of Systems: Review of Systems  Constitutional: Negative.   HENT: Negative.    Respiratory: Negative.    Cardiovascular: Negative.   Gastrointestinal: Negative.   Musculoskeletal: Negative.   Neurological: Negative.   Psychiatric/Behavioral: Negative.    All other systems reviewed and are negative.  PHYSICAL EXAM: VS:  BP 120/66 (BP Location: Left Arm, Patient Position: Sitting, Cuff Size: Normal)   Pulse 64   Ht 6' (1.829 m)   Wt 165 lb 8 oz (75.1 kg)   SpO2 98%   BMI 22.45 kg/m  , BMI Body mass index is 22.45 kg/m. Constitutional:  oriented to person, place, and time. No distress.  HENT:  Head: Grossly normal Eyes:  no discharge. No scleral icterus.  Neck: No JVD, no carotid bruits  Cardiovascular: Regular rate and rhythm, no murmurs appreciated Pulmonary/Chest: Clear to auscultation bilaterally, no wheezes or rails Abdominal: Soft.  no distension.  no tenderness.  Musculoskeletal: Normal range of motion Neurological:  normal muscle tone. Coordination normal. No atrophy Skin: Skin warm and dry Psychiatric: normal affect, pleasant   Recent Labs: 06/06/2020: Magnesium 1.8 08/23/2020: ALT 8; BUN 19; Creatinine, Ser 0.89; Potassium 5.1; Sodium 138; TSH 3.090    Lipid Panel Lab Results  Component Value Date   CHOL 144 05/19/2020   HDL 79 05/19/2020   LDLCALC 52 05/19/2020   TRIG 67 05/19/2020      Wt Readings from Last 3 Encounters:  11/04/20 165 lb 8 oz (75.1 kg)  08/19/20 166 lb (75.3 kg)  05/19/20 162 lb 12.8 oz (73.8 kg)     ASSESSMENT AND PLAN:  Type 2 diabetes mellitus without complication, with long-term current use of insulin (HCC) Managed by primary care,  A1c of 6 weight stable active  Syncope and collapse -  No significant orthostasis  Continue hydrating   CAD, Stable angina cardiac catheterization April 2019   stent to the proximal  RCA Denies anginal symptoms Having atypical pain  High cholesterol -  Cholesterol is at goal on the current lipid regimen. No changes to the medications were made.  Hyperkalemia avoiding ACE inhibitors ARB Recent potassium >5 On valtessa    Total encounter time more than 25 minutes  Greater than 50% was spent in counseling and coordination of care with the patient    Orders Placed This Encounter  Procedures   EKG 12-Lead      Signed, Esmond Plants, M.D., Ph.D. 11/04/2020  [place], [place]

## 2020-11-18 ENCOUNTER — Encounter: Payer: Self-pay | Admitting: Nurse Practitioner

## 2020-11-18 ENCOUNTER — Ambulatory Visit (INDEPENDENT_AMBULATORY_CARE_PROVIDER_SITE_OTHER): Payer: Medicare Other | Admitting: Nurse Practitioner

## 2020-11-18 ENCOUNTER — Other Ambulatory Visit: Payer: Self-pay

## 2020-11-18 VITALS — BP 118/62 | HR 74 | Temp 98.0°F | Wt 166.2 lb

## 2020-11-18 DIAGNOSIS — E1169 Type 2 diabetes mellitus with other specified complication: Secondary | ICD-10-CM

## 2020-11-18 DIAGNOSIS — Z794 Long term (current) use of insulin: Secondary | ICD-10-CM

## 2020-11-18 DIAGNOSIS — E785 Hyperlipidemia, unspecified: Secondary | ICD-10-CM

## 2020-11-18 DIAGNOSIS — I152 Hypertension secondary to endocrine disorders: Secondary | ICD-10-CM

## 2020-11-18 DIAGNOSIS — Z23 Encounter for immunization: Secondary | ICD-10-CM

## 2020-11-18 DIAGNOSIS — I700 Atherosclerosis of aorta: Secondary | ICD-10-CM

## 2020-11-18 DIAGNOSIS — E1159 Type 2 diabetes mellitus with other circulatory complications: Secondary | ICD-10-CM

## 2020-11-18 DIAGNOSIS — E1165 Type 2 diabetes mellitus with hyperglycemia: Secondary | ICD-10-CM | POA: Diagnosis not present

## 2020-11-18 DIAGNOSIS — E875 Hyperkalemia: Secondary | ICD-10-CM | POA: Diagnosis not present

## 2020-11-18 DIAGNOSIS — I25119 Atherosclerotic heart disease of native coronary artery with unspecified angina pectoris: Secondary | ICD-10-CM

## 2020-11-18 LAB — BAYER DCA HB A1C WAIVED: HB A1C (BAYER DCA - WAIVED): 6.1 % (ref <7.0)

## 2020-11-18 NOTE — Progress Notes (Addendum)
BP 118/62 (BP Location: Left Arm, Patient Position: Sitting, Cuff Size: Normal)   Pulse 74   Temp 98 F (36.7 C) (Oral)   Wt 166 lb 3.2 oz (75.4 kg)   SpO2 97%   BMI 22.54 kg/m    Subjective:    Patient ID: Johnny Wolfe, male    DOB: Sep 08, 1945, 75 y.o.   MRN: [id number]  HPI: Johnny Wolfe is a 75 y.o. male  Chief Complaint  Patient presents with   Diabetes   Hyperlipidemia   Hypertension   Hyperkalemia   DIABETES Last A1C 6.6% in April.  Continues on Metformin 1000 MG BID and Lantus 14 units.    Have tried GLP1 and SGLT, but this was not beneficial.  He lost weight with changes in medications and felt tired. Glutamic acid <5.0 and C peptide 2.1 in 2021 on check -- ?Type 1 1/2 vs Type 2. Hypoglycemic episodes:no Polydipsia/polyuria: no Visual disturbance: no Chest pain: no Paresthesias: no Glucose Monitoring: yes             Accucheck frequency: Daily             Fasting glucose: 56 to 158             Post prandial:              Evening:             Before meals: Taking Insulin?: no             Long acting insulin: Lantus 14 units             Short acting insulin: Blood Pressure Monitoring: daily Retinal Examination: Up to Date Foot Exam: Up to Date Pneumovax: Up to Date Influenza: Up to Date Aspirin: yes   HYPERTENSION / HYPERLIPIDEMIA Continues on ASA and Metoprolol + Lipitor. Last saw cardiology 11/04/20, he has history of cardiac cath with stent to proximal RCA in April 2019.  Continues on Valtessa for hyperkalemia --- recent K+ 5.1, work-up showed WNL labs.  Never has used NTG.  Has history of aortic atherosclerosis noted on past imaging.  Denies any fatigue or chest pain today. Satisfied with current treatment? yes Duration of hypertension: chronic BP monitoring frequency: not checking BP range: not checking BP medication side effects: no Duration of hyperlipidemia: chronic Cholesterol medication side effects: no Cholesterol supplements: none Medication  compliance: good compliance Aspirin: yes Recent stressors: no Recurrent headaches: no Visual changes: no Palpitations: no Dyspnea: no Chest pain: no Lower extremity edema: no Dizzy/lightheaded: no  Relevant past medical, surgical, family and social history reviewed and updated as indicated. Interim medical history since our last visit reviewed. Allergies and medications reviewed and updated.  Review of Systems  Constitutional:  Negative for activity change, diaphoresis, fatigue and fever.  Respiratory:  Negative for cough, chest tightness, shortness of breath and wheezing.   Cardiovascular:  Negative for chest pain, palpitations and leg swelling.  Gastrointestinal: Negative.   Endocrine: Negative for polydipsia, polyphagia and polyuria.  Neurological: Negative.   Psychiatric/Behavioral: Negative.     Per HPI unless specifically indicated above     Objective:    BP 118/62 (BP Location: Left Arm, Patient Position: Sitting, Cuff Size: Normal)   Pulse 74   Temp 98 F (36.7 C) (Oral)   Wt 166 lb 3.2 oz (75.4 kg)   SpO2 97%   BMI 22.54 kg/m   Wt Readings from Last 3 Encounters:  11/18/20 166 lb 3.2 oz (75.4 kg)  11/04/20  165 lb 8 oz (75.1 kg)  08/19/20 166 lb (75.3 kg)    Physical Exam Vitals and nursing note reviewed.  Constitutional:      General: He is awake. He is not in acute distress.    Appearance: He is well-developed and well-groomed. He is not ill-appearing.  HENT:     Head: Normocephalic and atraumatic.     Right Ear: Hearing normal. No drainage.     Left Ear: Hearing normal. No drainage.  Eyes:     General: Lids are normal.        Right eye: No discharge.        Left eye: No discharge.     Conjunctiva/sclera: Conjunctivae normal.     Pupils: Pupils are equal, round, and reactive to light.  Neck:     Vascular: No carotid bruit.  Cardiovascular:     Rate and Rhythm: Normal rate and regular rhythm.     Heart sounds: Normal heart sounds, S1 normal and S2  normal. No murmur heard.   No gallop.  Pulmonary:     Effort: Pulmonary effort is normal. No accessory muscle usage or respiratory distress.     Breath sounds: Normal breath sounds.  Abdominal:     General: Bowel sounds are normal.     Palpations: Abdomen is soft.  Musculoskeletal:        General: Normal range of motion.     Cervical back: Normal range of motion and neck supple.     Right lower leg: No edema.     Left lower leg: No edema.  Skin:    General: Skin is warm and dry.  Neurological:     Mental Status: He is alert and oriented to person, place, and time.  Psychiatric:        Attention and Perception: Attention normal.        Mood and Affect: Mood normal.        Speech: Speech normal.        Behavior: Behavior normal. Behavior is cooperative.        Thought Content: Thought content normal.  Diabetic Foot Exam - Simple   Simple Foot Form Visual Inspection No deformities, no ulcerations, no other skin breakdown bilaterally: Yes Sensation Testing Intact to touch and monofilament testing bilaterally: Yes Pulse Check Posterior Tibialis and Dorsalis pulse intact bilaterally: Yes Comments Very good sensation.    Results for orders placed or performed in visit on 08/24/20  Protein, Urine, 24 hour  Result Value Ref Range   Protein, Ur 11.6 Not Estab. mg/dL   Protein, 24H Urine 209 (H) 30 - 150 mg/24 hr      Assessment & Plan:   Problem List Items Addressed This Visit       Cardiovascular and Mediastinum   Hypertension associated with diabetes ([place])    Chronic, ongoing.  BP at goal today on home reading.  Continue current medication regimen and collaboration with cardiology.  Adjust as needed.  Recommend he continue to monitor BP occasionally at home and document + focus on DASH diet.  BMP today.  Return in 3 months.        Relevant Orders   Basic metabolic panel   Bayer DCA Hb A1c Waived   Coronary artery disease involving native coronary artery with angina  pectoris (HCC)    Chronic, stable with no recent NTG use.  Continue current medication regimen and collaboration with cardiology.       Aortic atherosclerosis ([place])  Noted on past imaging, continue statin and ASA daily for prevention.         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 Stable with Lantus and Metformin use, will most likely need to maintain on long term Lantus as tolerates this well -- he is aware to reduce dosing if BS too low, suspect more Type 1.5 diabetic.  Continue to monitor BS at home and a post prandial check. Return in 3 months.       Relevant Orders   Basic metabolic panel   Bayer DCA Hb A1c Waived   Hyperlipidemia associated with type 2 diabetes mellitus (HCC)    Chronic, ongoing.  Continue current medication regimen and adjust as needed.  Current LDL well controlled.  Lipid panel today.       Relevant Orders   Bayer DCA Hb A1c Waived   Lipid Panel w/o Chol/HDL Ratio     Other   Hyperkalemia    Noted to be trending down with addition of Veltassa, last level 5.1.  Continue current medication regimen -- full work-up performed in April and all WNL.  Consider referral to nephrology or endocrinology as needed.  Discussed with patient.       Relevant Orders   Basic metabolic panel   Other Visit Diagnoses     Need for Tdap vaccination       Tdap today to update   Relevant Orders   Tdap vaccine greater than or equal to 7yo IM        Follow up plan: Return in about 3 months (around 02/18/2021) for Annual physical.

## 2020-11-18 NOTE — Patient Instructions (Signed)
Diabetes Mellitus and Nutrition, Adult When you have diabetes, or diabetes mellitus, it is very important to have healthy eating habits because your blood sugar (glucose) levels are greatly affected by what you eat and drink. Eating healthy foods in the right amounts, at about the same times every day, can help you:  Control your blood glucose.  Lower your risk of heart disease.  Improve your blood pressure.  Reach or maintain a healthy weight. What can affect my meal plan? Every person with diabetes is different, and each person has different needs for a meal plan. Your health care provider may recommend that you work with a dietitian to make a meal plan that is best for you. Your meal plan may vary depending on factors such as:  The calories you need.  The medicines you take.  Your weight.  Your blood glucose, blood pressure, and cholesterol levels.  Your activity level.  Other health conditions you have, such as heart or kidney disease. How do carbohydrates affect me? Carbohydrates, also called carbs, affect your blood glucose level more than any other type of food. Eating carbs naturally raises the amount of glucose in your blood. Carb counting is a method for keeping track of how many carbs you eat. Counting carbs is important to keep your blood glucose at a healthy level, especially if you use insulin or take certain oral diabetes medicines. It is important to know how many carbs you can safely have in each meal. This is different for every person. Your dietitian can help you calculate how many carbs you should have at each meal and for each snack. How does alcohol affect me? Alcohol can cause a sudden decrease in blood glucose (hypoglycemia), especially if you use insulin or take certain oral diabetes medicines. Hypoglycemia can be a life-threatening condition. Symptoms of hypoglycemia, such as sleepiness, dizziness, and confusion, are similar to symptoms of having too much  alcohol.  Do not drink alcohol if: ? Your health care provider tells you not to drink. ? You are pregnant, may be pregnant, or are planning to become pregnant.  If you drink alcohol: ? Do not drink on an empty stomach. ? Limit how much you use to:  0-1 drink a day for women.  0-2 drinks a day for men. ? Be aware of how much alcohol is in your drink. In the [place] one drink equals one 12 oz bottle of beer (355 mL), one 5 oz glass of wine (148 mL), or one 1 oz glass of hard liquor (44 mL). ? Keep yourself hydrated with water, diet soda, or unsweetened iced tea.  Keep in mind that regular soda, juice, and other mixers may contain a lot of sugar and must be counted as carbs. What are tips for following this plan? Reading food labels  Start by checking the serving size on the "Nutrition Facts" label of packaged foods and drinks. The amount of calories, carbs, fats, and other nutrients listed on the label is based on one serving of the item. Many items contain more than one serving per package.  Check the total grams (g) of carbs in one serving. You can calculate the number of servings of carbs in one serving by dividing the total carbs by 15. For example, if a food has 30 g of total carbs per serving, it would be equal to 2 servings of carbs.  Check the number of grams (g) of saturated fats and trans fats in one serving. Choose foods that have   a low amount or none of these fats.  Check the number of milligrams (mg) of salt (sodium) in one serving. Most people should limit total sodium intake to less than 2,300 mg per day.  Always check the nutrition information of foods labeled as "low-fat" or "nonfat." These foods may be higher in added sugar or refined carbs and should be avoided.  Talk to your dietitian to identify your daily goals for nutrients listed on the label. Shopping  Avoid buying canned, pre-made, or processed foods. These foods tend to be high in fat, sodium, and added  sugar.  Shop around the outside edge of the grocery store. This is where you will most often find fresh fruits and vegetables, bulk grains, fresh meats, and fresh dairy. Cooking  Use low-heat cooking methods, such as baking, instead of high-heat cooking methods like deep frying.  Cook using healthy oils, such as olive, canola, or sunflower oil.  Avoid cooking with butter, cream, or high-fat meats. Meal planning  Eat meals and snacks regularly, preferably at the same times every day. Avoid going long periods of time without eating.  Eat foods that are high in fiber, such as fresh fruits, vegetables, beans, and whole grains. Talk with your dietitian about how many servings of carbs you can eat at each meal.  Eat 4-6 oz (112-168 g) of lean protein each day, such as lean meat, chicken, fish, eggs, or tofu. One ounce (oz) of lean protein is equal to: ? 1 oz (28 g) of meat, chicken, or fish. ? 1 egg. ?  cup (62 g) of tofu.  Eat some foods each day that contain healthy fats, such as avocado, nuts, seeds, and fish.   What foods should I eat? Fruits Berries. Apples. Oranges. Peaches. Apricots. Plums. Grapes. Mango. Papaya. Pomegranate. Kiwi. Cherries. Vegetables Lettuce. Spinach. Leafy greens, including kale, chard, collard greens, and mustard greens. Beets. Cauliflower. Cabbage. Broccoli. Carrots. Green beans. Tomatoes. Peppers. Onions. Cucumbers. Brussels sprouts. Grains Whole grains, such as whole-wheat or whole-grain bread, crackers, tortillas, cereal, and pasta. Unsweetened oatmeal. Quinoa. Brown or wild rice. Meats and other proteins Seafood. Poultry without skin. Lean cuts of poultry and beef. Tofu. Nuts. Seeds. Dairy Low-fat or fat-free dairy products such as milk, yogurt, and cheese. The items listed above may not be a complete list of foods and beverages you can eat. Contact a dietitian for more information. What foods should I avoid? Fruits Fruits canned with  syrup. Vegetables Canned vegetables. Frozen vegetables with butter or cream sauce. Grains Refined white flour and flour products such as bread, pasta, snack foods, and cereals. Avoid all processed foods. Meats and other proteins Fatty cuts of meat. Poultry with skin. Breaded or fried meats. Processed meat. Avoid saturated fats. Dairy Full-fat yogurt, cheese, or milk. Beverages Sweetened drinks, such as soda or iced tea. The items listed above may not be a complete list of foods and beverages you should avoid. Contact a dietitian for more information. Questions to ask a health care provider  Do I need to meet with a diabetes educator?  Do I need to meet with a dietitian?  What number can I call if I have questions?  When are the best times to check my blood glucose? Where to find more information:  American Diabetes Association: diabetes.org  Academy of Nutrition and Dietetics: www.eatright.org  National Institute of Diabetes and Digestive and Kidney Diseases: www.niddk.nih.gov  Association of Diabetes Care and Education Specialists: www.diabeteseducator.org Summary  It is important to have healthy eating   habits because your blood sugar (glucose) levels are greatly affected by what you eat and drink.  A healthy meal plan will help you control your blood glucose and maintain a healthy lifestyle.  Your health care provider may recommend that you work with a dietitian to make a meal plan that is best for you.  Keep in mind that carbohydrates (carbs) and alcohol have immediate effects on your blood glucose levels. It is important to count carbs and to use alcohol carefully. This information is not intended to replace advice given to you by your health care provider. Make sure you discuss any questions you have with your health care provider. Document Revised: 03/24/2019 Document Reviewed: 03/24/2019 Elsevier Patient Education  2021 Elsevier Inc.  

## 2020-11-18 NOTE — Assessment & Plan Note (Signed)
Noted on past imaging, continue statin and ASA daily for prevention.

## 2020-11-18 NOTE — Assessment & Plan Note (Signed)
Chronic, stable with no recent NTG use.  Continue current medication regimen and collaboration with cardiology. 

## 2020-11-18 NOTE — Assessment & Plan Note (Signed)
Chronic, ongoing.  Continue current medication regimen and adjust as needed.  Current LDL well controlled.  Lipid panel today.

## 2020-11-18 NOTE — Assessment & Plan Note (Signed)
Noted to be trending down with addition of Veltassa, last level 5.1.  Continue current medication regimen -- full work-up performed in April and all WNL.  Consider referral to nephrology or endocrinology as needed.  Discussed with patient.

## 2020-11-18 NOTE — Assessment & Plan Note (Signed)
Chronic, ongoing with A1C continuing at goal today 6.1%, urine ALB last in April. Stable with Lantus and Metformin use, will most likely need to maintain on long term Lantus as tolerates this well -- he is aware to reduce dosing if BS too low, suspect more Type 1.5 diabetic.  Continue to monitor BS at home and a post prandial check. Return in 3 months.

## 2020-11-18 NOTE — Assessment & Plan Note (Signed)
Chronic, ongoing.  BP at goal today on home reading.  Continue current medication regimen and collaboration with cardiology.  Adjust as needed.  Recommend he continue to monitor BP occasionally at home and document + focus on DASH diet.  BMP today.  Return in 3 months.

## 2020-11-19 LAB — LIPID PANEL W/O CHOL/HDL RATIO
Cholesterol, Total: 118 mg/dL (ref 100–199)
HDL: 70 mg/dL (ref >39)
LDL Chol Calc (NIH): 33 mg/dL (ref 0–99)
Triglycerides: 70 mg/dL (ref 0–149)
VLDL Cholesterol Cal: 15 mg/dL (ref 5–40)

## 2020-11-19 LAB — BASIC METABOLIC PANEL
BUN/Creatinine Ratio: 18 (ref 10–24)
BUN: 15 mg/dL (ref 8–27)
CO2: 24 mmol/L (ref 20–29)
Calcium: 10 mg/dL (ref 8.6–10.2)
Chloride: 101 mmol/L (ref 96–106)
Creatinine, Ser: 0.85 mg/dL (ref 0.76–1.27)
Glucose: 92 mg/dL (ref 65–99)
Potassium: 5.2 mmol/L (ref 3.5–5.2)
Sodium: 140 mmol/L (ref 134–144)
eGFR: 92 mL/min/{1.73_m2} (ref >59)

## 2020-11-19 NOTE — Progress Notes (Signed)
Contacted via [facility] [place] morning Deundra, your labs have returned and everything continues to look great.  Continue you current medications.  Any questions? Keep being awesome!!  Thank you for allowing me to participate in your care.  I appreciate you. Kindest regards, Yvette Roark

## 2020-11-30 DIAGNOSIS — L570 Actinic keratosis: Secondary | ICD-10-CM | POA: Diagnosis not present

## 2020-11-30 DIAGNOSIS — L821 Other seborrheic keratosis: Secondary | ICD-10-CM | POA: Diagnosis not present

## 2020-12-01 DIAGNOSIS — E119 Type 2 diabetes mellitus without complications: Secondary | ICD-10-CM | POA: Diagnosis not present

## 2021-01-05 ENCOUNTER — Other Ambulatory Visit: Payer: Self-pay | Admitting: Nurse Practitioner

## 2021-01-25 DIAGNOSIS — M720 Palmar fascial fibromatosis [Dupuytren]: Secondary | ICD-10-CM | POA: Diagnosis not present

## 2021-02-02 ENCOUNTER — Other Ambulatory Visit: Payer: Self-pay | Admitting: Nurse Practitioner

## 2021-02-02 NOTE — Telephone Encounter (Signed)
Requested Prescriptions  Pending Prescriptions Disp Refills  . Lancets (ONETOUCH DELICA PLUS [id number]) [place] [Pharmacy Med Name: ONETOUCH DELICA PLUS [id number]] 100 each 1    Sig: USE TO CHECK BLOOD SUGAR 2-3 TIMES DAILY     Endocrinology: Diabetes - Testing Supplies Passed - 02/02/2021 10:17 AM      Passed - Valid encounter within last 12 months    Recent Outpatient Visits          2 months ago Type 2 diabetes mellitus with hyperglycemia, with long-term current use of insulin ([place])   [employer] [place], Jolene T, NP   5 months ago Type 2 diabetes mellitus with hyperglycemia, with long-term current use of insulin ([place])   [place], Jolene T, NP   8 months ago Type 2 diabetes mellitus with hyperglycemia, with long-term current use of insulin ([place])   [place], Jolene T, NP   11 months ago Type 2 diabetes mellitus with hyperglycemia, with long-term current use of insulin ([place])   [place], [place] T, NP   1 year ago Type 2 diabetes mellitus without complication, with long-term current use of insulin ([place])   [place], Barbaraann Faster, NP      Future Appointments            In 3 weeks Cannady, Barbaraann Faster, NP [facility]   In 1 month  [facility]           . ONETOUCH VERIO test strip [employer] Med Name: ONETOUCH VERIO STRIP] 100 each 1    Sig: USE TO [place] BLOOD SUGAR 2-3 TIMES PER DAY AS DIRECTED     Endocrinology: Diabetes - Testing Supplies Passed - 02/02/2021 10:17 AM      Passed - Valid encounter within last 12 months    Recent Outpatient Visits          2 months ago Type 2 diabetes mellitus with hyperglycemia, with long-term current use of insulin ([place])   [place], Jolene T, NP   5 months ago Type 2 diabetes mellitus with hyperglycemia, with long-term current use of insulin ([place])   [place], Jolene  T, NP   8 months ago Type 2 diabetes mellitus with hyperglycemia, with long-term current use of insulin ([place])   [place], Jolene T, NP   11 months ago Type 2 diabetes mellitus with hyperglycemia, with long-term current use of insulin ([place])   [place], [place] T, NP   1 year ago Type 2 diabetes mellitus without complication, with long-term current use of insulin ([place])   [place], Barbaraann Faster, NP      Future Appointments            In 3 weeks Cannady, Barbaraann Faster, NP [facility]   In 1 month  [facility]

## 2021-02-10 DIAGNOSIS — M79641 Pain in right hand: Secondary | ICD-10-CM | POA: Diagnosis not present

## 2021-02-10 DIAGNOSIS — M720 Palmar fascial fibromatosis [Dupuytren]: Secondary | ICD-10-CM | POA: Insufficient documentation

## 2021-02-16 ENCOUNTER — Encounter: Payer: Self-pay | Admitting: Nurse Practitioner

## 2021-02-16 DIAGNOSIS — M720 Palmar fascial fibromatosis [Dupuytren]: Secondary | ICD-10-CM | POA: Insufficient documentation

## 2021-02-23 ENCOUNTER — Ambulatory Visit (INDEPENDENT_AMBULATORY_CARE_PROVIDER_SITE_OTHER): Payer: Medicare Other | Admitting: Nurse Practitioner

## 2021-02-23 ENCOUNTER — Other Ambulatory Visit: Payer: Self-pay

## 2021-02-23 ENCOUNTER — Encounter: Payer: Self-pay | Admitting: Nurse Practitioner

## 2021-02-23 VITALS — BP 131/66 | HR 61 | Temp 97.8°F | Ht 72.5 in | Wt 167.8 lb

## 2021-02-23 DIAGNOSIS — Z0000 Encounter for general adult medical examination without abnormal findings: Secondary | ICD-10-CM

## 2021-02-23 DIAGNOSIS — E1159 Type 2 diabetes mellitus with other circulatory complications: Secondary | ICD-10-CM | POA: Diagnosis not present

## 2021-02-23 DIAGNOSIS — E785 Hyperlipidemia, unspecified: Secondary | ICD-10-CM

## 2021-02-23 DIAGNOSIS — E1169 Type 2 diabetes mellitus with other specified complication: Secondary | ICD-10-CM

## 2021-02-23 DIAGNOSIS — Z136 Encounter for screening for cardiovascular disorders: Secondary | ICD-10-CM | POA: Diagnosis not present

## 2021-02-23 DIAGNOSIS — Z794 Long term (current) use of insulin: Secondary | ICD-10-CM

## 2021-02-23 DIAGNOSIS — Z7189 Other specified counseling: Secondary | ICD-10-CM | POA: Diagnosis not present

## 2021-02-23 DIAGNOSIS — I152 Hypertension secondary to endocrine disorders: Secondary | ICD-10-CM | POA: Diagnosis not present

## 2021-02-23 DIAGNOSIS — I25119 Atherosclerotic heart disease of native coronary artery with unspecified angina pectoris: Secondary | ICD-10-CM

## 2021-02-23 DIAGNOSIS — E875 Hyperkalemia: Secondary | ICD-10-CM

## 2021-02-23 DIAGNOSIS — E1165 Type 2 diabetes mellitus with hyperglycemia: Secondary | ICD-10-CM | POA: Diagnosis not present

## 2021-02-23 DIAGNOSIS — M720 Palmar fascial fibromatosis [Dupuytren]: Secondary | ICD-10-CM

## 2021-02-23 DIAGNOSIS — N400 Enlarged prostate without lower urinary tract symptoms: Secondary | ICD-10-CM

## 2021-02-23 DIAGNOSIS — Z87891 Personal history of nicotine dependence: Secondary | ICD-10-CM | POA: Insufficient documentation

## 2021-02-23 LAB — MICROALBUMIN, URINE WAIVED
Creatinine, Urine Waived: 200 mg/dL (ref 10–300)
Microalb, Ur Waived: 30 mg/L — ABNORMAL HIGH (ref 0–19)
Microalb/Creat Ratio: <30 mg/g (ref <30)

## 2021-02-23 LAB — BAYER DCA HB A1C WAIVED: HB A1C (BAYER DCA - WAIVED): 6.2 % — ABNORMAL HIGH (ref 4.8–5.6)

## 2021-02-23 MED ORDER — METOPROLOL TARTRATE 25 MG PO TABS: 12.5000 mg | ORAL_TABLET | Freq: Two times a day (BID) | ORAL | 4 refills | Status: DC

## 2021-02-23 MED ORDER — METFORMIN HCL 1000 MG PO TABS: ORAL_TABLET | ORAL | 4 refills | Status: DC

## 2021-02-23 MED ORDER — VELTASSA 8.4 G PO PACK: PACK | ORAL | 5 refills | Status: DC

## 2021-02-23 NOTE — Assessment & Plan Note (Signed)
Chronic, ongoing.  BP at goal today on home reading.  Continue current medication regimen and collaboration with cardiology.  Adjust as needed.  Recommend he continue to monitor BP occasionally at home and document + focus on DASH diet.  CMP, TSH, CBC today.  Return in 3 months.

## 2021-02-23 NOTE — Patient Instructions (Signed)
Abdominal Aortic Aneurysm  An abdominal aortic aneurysm (AAA) is an aneurysm that occurs in the lower part of the aorta. The aorta is the main artery of the body, and it supplies blood from the heart to the rest of the body. An aneurysm is a bulge in an artery. Ananeurysm happens when blood pushes against a weakened or damaged artery wall. Most aneurysms do not cause symptoms, but some do cause problems. An AAA can cause two serious problems: It can enlarge and burst. It can cause blood to flow between the layers of the wall of the aorta through a tear (aortic dissection). These problems are medical emergencies. They can cause bleeding inside the body. If they are not diagnosed and treated right away, they can belife-threatening. What are the causes? The exact cause of this condition is not known. What increases the risk? The following factors may make you more likely to develop this condition: Being male and 60 years of age or older. Being of [place] descent. Using or having used nicotine or tobacco products. Having a family history of aneurysms. Having any of these conditions: Hardening of your arteries (arteriosclerosis). Inflammation of the walls of an artery (arteritis). Certain genetic conditions. Obesity. An infection in the wall of your aorta (infectious aortitis) caused by bacteria. High cholesterol. High blood pressure (hypertension). What are the signs or symptoms? Symptoms of this condition vary depending on the size of your aneurysm and how fast it is growing. Most aneurysms grow slowly and do not cause symptoms. When symptoms do occur, they may include: Severe pain in your abdomen, side, or lower back. Feeling full after eating only small amounts of food. Feeling a throbbing lump in your abdomen. Painful feet or toes, or discolored skin or sores on feet or toes. Constipation or trouble urinating. Symptoms of an AAA that has burst include: Severe pain in your  abdomen, side, or back that comes on suddenly. Nausea or vomiting. Feeling light-headed or fainting. How is this diagnosed? This condition may be diagnosed with: A physical exam to check for throbbing and to listen to blood flow in your abdomen. Tests, such as: Ultrasound. X-rays. CT scan. MRI. Angiograms. These tests check your arteries for damage or blockage. Because most AAAs that have not burst do not cause symptoms, they are oftenfound during exams for other conditions. How is this treated? Treatment for this condition depends on: The size of your aneurysm. How fast your aneurysm is growing. Your age. Risk factors for a burst AAA. If your aneurysm is smaller than 2 inches (5 cm), your health care provider may: Monitor it regularly to see if it is getting bigger. Depending on the size of the aneurysm, how fast it is growing, and your other risk factors, you may have an ultrasound to monitor it every 3-6 months, every year, or every few years. Give you medicines to control blood pressure, treat pain, or fight infection. If your aneurysm is larger than 2 inches (5 cm), your health care provider mayrepair it with surgery. Follow these instructions at home: Eating and drinking  Eat a heart-healthy diet. This includes plenty of fresh fruits and vegetables, whole grains, low-fat (lean) protein, and low-fat dairy products. Avoid foods that are high in saturated fat and cholesterol, such as red meat and some dairy products.  Lifestyle     Do not use any products that contain nicotine or tobacco, such as cigarettes, e-cigarettes, and chewing tobacco. If you need help quitting, ask your health care provider.   Stay physically active and exercise regularly. Talk with your health care provider about how often to exercise and which types of exercise are safe for you. Maintain a healthy weight. Alcohol use Do not drink alcohol if: Your health care provider tells you not to drink. You are  pregnant, may be pregnant, or are planning to become pregnant. If you drink alcohol: Limit how much you use to: 0-1 drink a day for women. 0-2 drinks a day for men. Be aware of how much alcohol is in your drink. In the [place] one drink equals one 12 oz bottle of beer (355 mL), one 5 oz glass of wine (148 mL), or one 1 oz glass of hard liquor (44 mL). General instructions Take over-the-counter and prescription medicines only as told by your health care provider. Keep your blood pressure within a normal range. Check it regularly, and ask your health care provider what your target blood pressure should be. Have your blood sugar (glucose) level and cholesterol levels checked regularly. Follow instructions on how to keep levels within normal limits. Avoid heavy lifting and activities that take a lot of effort. Ask what activities are safe for you. If you can, learn your family's health history. Keep all follow-up visits as told by your health care provider. This is important. Contact a health care provider if you have: Pain in your abdomen, side, or back. Throbbing in your abdomen. A fever. Get help right away if: You have sudden, severe pain in your abdomen, side, or back. You experience nausea or vomiting. You feel light-headed or you faint. Your heart beats fast when you stand. You have sweaty, clammy skin. You have shortness of breath. You have constipation or trouble urinating. These symptoms may represent a serious problem that is an emergency. Do not wait to see if the symptoms will go away. Get medical help right away. Call your local emergency services (911 in the [place] Do not drive yourself to the hospital. Summary An aneurysm is a bulge in an artery. An abdominal aortic aneurysm (AAA) is an aneurysm in the lower part of the aorta. An AAA can cause bleeding inside the body, and it can be life-threatening. Risk can increase if you are male, age 60 or older, and of [place]  descent, or if you have used nicotine or tobacco products and have a family history of aneurysms. Get help right away if you have symptoms of a burst AAA. This information is not intended to replace advice given to you by your health care provider. Make sure you discuss any questions you have with your healthcare provider. Document Revised: 01/30/2019 Document Reviewed: 01/30/2019 Elsevier Patient Education  2022 [employer].  

## 2021-02-23 NOTE — Assessment & Plan Note (Signed)
Noted to be trending down with addition of Veltassa, last level 5.1.  Continue current medication regimen -- full work-up performed in April and all WNL.  Consider referral to nephrology or endocrinology as needed.  Discussed with patient.

## 2021-02-23 NOTE — Assessment & Plan Note (Signed)
Chronic, ongoing with A1C continuing at goal last visit = 6.1%.  Recheck A1c and urine ALB today. 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  Continue to monitor BS at home and a post prandial check. Return in 3 months.

## 2021-02-23 NOTE — Assessment & Plan Note (Signed)
Reviewed with patient today == his HCP forms are in chart dated under media 11/04/2017.

## 2021-02-23 NOTE — Progress Notes (Signed)
BP 131/66   Pulse 61   Temp 97.8 F (36.6 C) (Oral)   Ht 6' 0.5" (1.842 m)   Wt 167 lb 12.8 oz (76.1 kg)   SpO2 95%   BMI 22.44 kg/m    Subjective:    Patient ID: Johnny Wolfe, male    DOB: Oct 25, 1945, 75 y.o.   MRN: [id number]  HPI: Johnny Wolfe is a 75 y.o. male presenting on 02/23/2021 for comprehensive medical examination. Current medical complaints include:none  He currently lives with: wife Interim Problems from his last visit: no  Is scheduled to have right hand surgery for Dupuytren's contracture on Monday with Dr. Greta Doom in [place].  DIABETES Last A1C 6.1% in July.  Continues on Metformin 1000 MG BID and Lantus 14 units.  Did reduce insulin to 13 units for short period, but sugars trended up and went back to 14.   Have tried GLP1 and SGLT, but these were not beneficial.  He lost weight with changes in medications and felt tired. Glutamic acid <5.0 and C peptide 2.1 in 2021 on check -- ?Type 1 1/2 vs Type 2. Hypoglycemic episodes: 49 x 1, ate something sweet to bring back up Polydipsia/polyuria: no Visual disturbance: no Chest pain: no Paresthesias: no Glucose Monitoring: yes             Accucheck frequency: Daily             Fasting glucose: 49 to 163 on chart July to October             Post prandial:              Evening:             Before meals: Taking Insulin?: no             Long acting insulin: Lantus 14 units             Short acting insulin: Blood Pressure Monitoring: daily Retinal Examination: Up to Date Foot Exam: Up to Date Pneumovax: Up to Date Influenza: Up to Date Aspirin: yes   HYPERTENSION / HYPERLIPIDEMIA Continues on ASA and Metoprolol + Lipitor. Last saw cardiology 11/04/20, he has history of cardiac cath with stent to proximal RCA in April 2019.  Continues on Valtessa for hyperkalemia --- work-up showed WNL labs and recent K+ stable.  Has never used NTG.  Has history of aortic atherosclerosis noted on past imaging.   Satisfied with  current treatment? yes Duration of hypertension: chronic BP monitoring frequency: not checking BP range: not checking BP medication side effects: no Duration of hyperlipidemia: chronic Cholesterol medication side effects: no Cholesterol supplements: none Medication compliance: good compliance Aspirin: yes Recent stressors: no Recurrent headaches: no Visual changes: no Palpitations: no Dyspnea: no Chest pain: no Lower extremity edema: no Dizzy/lightheaded: no  Functional Status Survey: Is the patient deaf or have difficulty hearing?: No Does the patient have difficulty seeing, even when wearing glasses/contacts?: No Does the patient have difficulty concentrating, remembering, or making decisions?: No Does the patient have difficulty walking or climbing stairs?: No Does the patient have difficulty dressing or bathing?: No Does the patient have difficulty doing errands alone such as visiting a doctor's office or shopping?: No  FALL RISK: Fall Risk  02/23/2021 02/23/2021 05/19/2020 03/11/2020 03/05/2019  Falls in the past year? 0 0 0 1 0  Comment - - - tripped, passed out due to low BP -  Number falls in past yr: 0 0 - 1 0  Injury with Fall? 0 1 - 1 0  Comment - - - broke cheek bone -  Risk for fall due to : No Fall Risks No Fall Risks - History of fall(s);Medication side effect -  Follow up Falls prevention discussed Falls evaluation completed - Falls evaluation completed;Education provided;Falls prevention discussed -   Depression Screen Depression screen [facility] 2/9 02/23/2021 05/19/2020 03/11/2020 03/05/2019 02/18/2019  Decreased Interest 0 0 0 0 0  Down, Depressed, Hopeless 0 0 0 0 0  PHQ - 2 Score 0 0 0 0 0  Altered sleeping 0 - - - 0  Tired, decreased energy 0 - - - 0  Change in appetite 0 - - - 0  Feeling bad or failure about yourself  0 - - - 0  Trouble concentrating 0 - - - 0  Moving slowly or fidgety/restless 0 - - - 0  Suicidal thoughts 0 - - - 0  PHQ-9 Score 0 - - -  0  Difficult doing work/chores Not difficult at all - - - Not difficult at all    Advanced Directives A voluntary discussion about advance care planning including the explanation and discussion of advance directives was extensively discussed  with the patient for 15 minutes with patient.  He does have papers scanned into chart on 11/04/2017.  Explanation about the health care proxy and Living will was reviewed and packet with forms with explanation of how to fill them out was given.  During this discussion, the patient was able to identify a health care proxy as his wife and sons and has papers scanned into chart.  Past Medical History:  Past Medical History:  Diagnosis Date   CAD (coronary artery disease)    a. 06/2017 Cardiac CT: Ca2+ score of 2310 - 95th%'ile;  b. 07/2017 Cath/PCI: LM nl, LAD 60p, 23m LCX 60p, OM2 40p, RCA dominant, 80p (3.5x15 [place]), 462m30d, EF 55-65%.   Cataract    [facility]   Essential hypertension    Hyperlipidemia    Syncope    Wears dentures    full upper and lower    Surgical History:  Past Surgical History:  Procedure Laterality Date   APPENDECTOMY     CARDIAC CATHETERIZATION     CATARACT EXTRACTION, BILATERAL     August 2017   CORONARY STENT INTERVENTION N/A 08/02/2017   Procedure: CORONARY STENT INTERVENTION;  Surgeon: ArWellington HampshireMD;  [facility];  Service: Cardiovascular;  Laterality: N/A;   LEFT HEART CATH AND CORONARY ANGIOGRAPHY Left 08/02/2017   Procedure: LEFT HEART CATH AND CORONARY ANGIOGRAPHY;  Surgeon: GoMinna MerrittsMD;  [facility];  Service: Cardiovascular;  Laterality: Left;   MASS EXCISION Left 07/04/2017   Procedure: REMOVAL TWO CYST EAR EAR LOBE AND EXTERNAL EAR;  Surgeon: McBeverly GustMD;  [facility] Service: ENT;  Laterality: Left;  LOCAL Diabetic - insulin and oral meds   [facility]  [facility]ications:   Current Outpatient Medications on File Prior to Visit  Medication Sig   aspirin EC 81 MG tablet Take 81 mg by mouth daily.   atorvastatin (LIPITOR) 40 MG tablet Take 1 tablet (40 mg total) by mouth daily. Due for follow-up in July.   glucose blood test strip OneTouch Verio test strips   Lancet Device MISC 1 Units by Does not apply route daily as needed.   Lancets ([facility]  DELICA PLUS [id number]) MISC OneTouch Delica Plus Lancet 33 gauge   LANTUS SOLOSTAR 100 UNIT/ML Solostar Pen INJECT SUBCUTANEOUSLY 16  UNITS DAILY   magnesium oxide (MAG-OX) 400 (240 Mg) MG tablet TAKE 1 TABLET BY MOUTH TWICE DAILY   metFORMIN (GLUCOPHAGE) 1000 MG tablet TAKE 1 TABLET BY MOUTH  TWICE DAILY WITH A MEAL   metoprolol tartrate (LOPRESSOR) 25 MG tablet Take 0.5 tablets (12.5 mg total) by mouth 2 (two) times daily.   nitroGLYCERIN (NITROSTAT) 0.4 MG SL tablet Place 1 tablet (0.4 mg total) under the tongue every 5 (five) minutes as needed for chest pain.   VELTASSA 8.4 g packet TAKE THE CONTENTS OF 1 PACKET DAILY AS DIRECTED   vitamin B-12 (CYANOCOBALAMIN) 1000 MCG tablet Take 500 mcg by mouth daily.    No current facility-administered medications on file prior to visit.    Allergies:  Allergies  Allergen Reactions   Neosporin [Bacitracin-Polymyxin B] Rash   Ace Inhibitors     Hyperkalemia   Neosporin [Neomycin-Bacitracin Zn-Polymyx]     Red rash     Social History:  Social History   Socioeconomic History   Marital status: Married    Spouse name: Not on file   Number of children: Not on file   Years of education: Not on file   Highest education level: Not on file  Occupational History   Not on file  Tobacco Use   Smoking status: Former    Packs/day: 1.00    Types: Cigarettes    Quit date: 10/27/2011    Years since quitting: 9.3   Smokeless tobacco: Never  Vaping Use   Vaping Use: Never used  Substance and Sexual Activity   Alcohol use: Yes    Alcohol/week: 7.0 - 11.0 standard drinks     Types: 1 Glasses of wine, 6 - 10 Shots of liquor per week    Comment: weekly   Drug use: No   Sexual activity: Not Currently  Other Topics Concern   Not on file  Social History Narrative   Not on file   Social Determinants of Health   Financial Resource Strain: Low Risk    Difficulty of Paying Living Expenses: Not hard at all  Food Insecurity: No Food Insecurity   Worried About Charity fundraiser in the Last Year: Never true   Ran Out of Food in the Last Year: Never true  Transportation Needs: No Transportation Needs   Lack of Transportation (Medical): No   Lack of Transportation (Non-Medical): No  Physical Activity: Inactive   Days of Exercise per Week: 0 days   Minutes of Exercise per Session: 0 min  Stress: No Stress Concern Present   Feeling of Stress : Not at all  Social Connections: Not on file  Intimate Partner Violence: Not on file   Social History   Tobacco Use  Smoking Status Former   Packs/day: 1.00   Types: Cigarettes   Quit date: 10/27/2011   Years since quitting: 9.3  Smokeless Tobacco Never   Social History   Substance and Sexual Activity  Alcohol Use Yes   Alcohol/week: 7.0 - 11.0 standard drinks   Types: 1 Glasses of wine, 6 - 10 Shots of liquor per week   Comment: weekly    Family History:  Family History  Problem Relation Age of Onset   Diabetes Mother    Arrhythmia Mother 24       Pacemaker implant    Cancer Father  lung   Lung cancer Sister    Kidney cancer Sister     Past medical history, surgical history, medications, allergies, family history and social history reviewed with patient today and changes made to appropriate areas of the chart.   ROS All other ROS negative except what is listed above and in the HPI.      Objective:    BP 131/66   Pulse 61   Temp 97.8 F (36.6 C) (Oral)   Ht 6' 0.5" (1.842 m)   Wt 167 lb 12.8 oz (76.1 kg)   SpO2 95%   BMI 22.44 kg/m   Wt Readings from Last 3 Encounters:  02/23/21 167  lb 12.8 oz (76.1 kg)  11/18/20 166 lb 3.2 oz (75.4 kg)  11/04/20 165 lb 8 oz (75.1 kg)    Physical Exam Vitals and nursing note reviewed.  Constitutional:      General: He is awake. He is not in acute distress.    Appearance: He is well-developed and well-groomed. He is not ill-appearing or toxic-appearing.  HENT:     Head: Normocephalic and atraumatic.     Right Ear: Hearing, tympanic membrane, ear canal and external ear normal. No drainage.     Left Ear: Hearing, tympanic membrane, ear canal and external ear normal. No drainage.     Nose: Nose normal.     Mouth/Throat:     Pharynx: Uvula midline.  Eyes:     General: Lids are normal.        Right eye: No discharge.        Left eye: No discharge.     Extraocular Movements: Extraocular movements intact.     Conjunctiva/sclera: Conjunctivae normal.     Pupils: Pupils are equal, round, and reactive to light.     Visual Fields: Right eye visual fields normal and left eye visual fields normal.  Neck:     Thyroid: No thyromegaly.     Vascular: No carotid bruit or JVD.     Trachea: Trachea normal.  Cardiovascular:     Rate and Rhythm: Normal rate and regular rhythm.     Heart sounds: Normal heart sounds, S1 normal and S2 normal. No murmur heard.   No gallop.  Pulmonary:     Effort: Pulmonary effort is normal. No accessory muscle usage or respiratory distress.     Breath sounds: Normal breath sounds.  Abdominal:     General: Bowel sounds are normal.     Palpations: Abdomen is soft. There is no hepatomegaly or splenomegaly.     Tenderness: There is no abdominal tenderness.  Musculoskeletal:        General: Normal range of motion.     Cervical back: Normal range of motion and neck supple.     Right lower leg: No edema.     Left lower leg: No edema.  Lymphadenopathy:     Head:     Right side of head: No submental, submandibular, tonsillar, preauricular or posterior auricular adenopathy.     Left side of head: No submental,  submandibular, tonsillar, preauricular or posterior auricular adenopathy.     Cervical: No cervical adenopathy.  Skin:    General: Skin is warm and dry.     Capillary Refill: Capillary refill takes less than 2 seconds.     Findings: No rash.  Neurological:     Mental Status: He is alert and oriented to person, place, and time.     Gait: Gait is intact.  Deep Tendon Reflexes: Reflexes are normal and symmetric.     Reflex Scores:      Brachioradialis reflexes are 2+ on the right side and 2+ on the left side.      Patellar reflexes are 2+ on the right side and 2+ on the left side. Psychiatric:        Attention and Perception: Attention normal.        Mood and Affect: Mood normal.        Speech: Speech normal.        Behavior: Behavior normal. Behavior is cooperative.        Thought Content: Thought content normal.        Cognition and Memory: Cognition normal.        Judgment: Judgment normal.    6CIT Screen 02/23/2021 03/11/2020 02/18/2019 11/04/2017  What Year? 0 points 0 points 0 points 0 points  What month? 0 points 0 points 0 points 0 points  What time? 0 points 0 points 0 points 0 points  Count back from 20 0 points 0 points 0 points 0 points  Months in reverse 0 points 0 points 0 points 0 points  Repeat phrase 2 points 2 points 0 points 0 points  Total Score 2 2 0 0   Results for orders placed or performed in visit on 16/10/96  Basic metabolic panel  Result Value Ref Range   Glucose 92 65 - 99 mg/dL   BUN 15 8 - 27 mg/dL   Creatinine, Ser 0.85 0.76 - 1.27 mg/dL   eGFR 92 >59 mL/min/1.73   BUN/Creatinine Ratio 18 10 - 24   Sodium 140 134 - 144 mmol/L   Potassium 5.2 3.5 - 5.2 mmol/L   Chloride 101 96 - 106 mmol/L   CO2 24 20 - 29 mmol/L   Calcium 10.0 8.6 - 10.2 mg/dL  Bayer DCA Hb A1c Waived  Result Value Ref Range   HB A1C (BAYER DCA - WAIVED) 6.1 <7.0 %  Lipid Panel w/o Chol/HDL Ratio  Result Value Ref Range   Cholesterol, Total 118 100 - 199 mg/dL    Triglycerides 70 0 - 149 mg/dL   HDL 70 >39 mg/dL   VLDL Cholesterol Cal 15 5 - 40 mg/dL   LDL Chol Calc (NIH) 33 0 - 99 mg/dL      Assessment & Plan:   Problem List Items Addressed This Visit       Cardiovascular and Mediastinum   Hypertension associated with diabetes ([place]) - Primary   Relevant Orders   Bayer DCA Hb A1c Waived   Microalbumin, Urine Waived   Comprehensive metabolic panel   CBC with Differential/Platelet   TSH     Endocrine   Type 2 diabetes mellitus with hyperglycemia (HCC)   Relevant Orders   Bayer DCA Hb A1c Waived   Hyperlipidemia associated with type 2 diabetes mellitus (HCC)   Relevant Orders   Bayer DCA Hb A1c Waived   Lipid Panel w/o Chol/HDL Ratio     Other   Hyperkalemia   Relevant Orders   Comprehensive metabolic panel   Other Visit Diagnoses     Benign prostatic hyperplasia without lower urinary tract symptoms       Relevant Orders   PSA   Hypomagnesemia       Relevant Orders   Magnesium   Screening for AAA (abdominal aortic aneurysm)       Relevant Orders   US AORTA MEDICARE SCREENING  Discussed aspirin prophylaxis for myocardial infarction prevention and decision was made to continue ASA  LABORATORY TESTING:  Health maintenance labs ordered today as discussed above.   The natural history of prostate cancer and ongoing controversy regarding screening and potential treatment outcomes of prostate cancer has been discussed with the patient. The meaning of a false positive PSA and a false negative PSA has been discussed. He indicates understanding of the limitations of this screening test and wishes to proceed with screening PSA testing.   IMMUNIZATIONS:   - Tdap: Tetanus vaccination status reviewed: last tetanus booster within 10 years. - Influenza: Up to date - Pneumovax: Up to date - Prevnar: Up to date - Zostavax vaccine: Up to date  SCREENING: - Colonoscopy: Up to date  Discussed with patient purpose of the  colonoscopy is to detect colon cancer at curable precancerous or early stages   - AAA Screening: Ordered today-- is a past smoker, smoked for 50 years, quit 10 years ago -Hearing Test: Not applicable  -Spirometry: Not applicable   PATIENT COUNSELING:    Sexuality: Discussed sexually transmitted diseases, partner selection, use of condoms, avoidance of unintended pregnancy  and contraceptive alternatives.   Advised to avoid cigarette smoking.  I discussed with the patient that most people either abstain from alcohol or drink within safe limits (<=14/week and <=4 drinks/occasion for males, <=7/weeks and <= 3 drinks/occasion for females) and that the risk for alcohol disorders and other health effects rises proportionally with the number of drinks per week and how often a drinker exceeds daily limits.  Discussed cessation/primary prevention of drug use and availability of treatment for abuse.   Diet: Encouraged to adjust caloric intake to maintain  or achieve ideal body weight, to reduce intake of dietary saturated fat and total fat, to limit sodium intake by avoiding high sodium foods and not adding table salt, and to maintain adequate dietary potassium and calcium preferably from fresh fruits, vegetables, and low-fat dairy products.    Stressed the importance of regular exercise  Injury prevention: Discussed safety belts, safety helmets, smoke detector, smoking near bedding or upholstery.   Dental health: Discussed importance of regular tooth brushing, flossing, and dental visits.   Follow up plan: NEXT PREVENTATIVE PHYSICAL DUE IN 1 YEAR. Return in about 3 months (around 05/26/2021) for T2DM, HTN/HLD.

## 2021-02-23 NOTE — Assessment & Plan Note (Signed)
Chronic, stable with no recent NTG use.  Continue current medication regimen and collaboration with cardiology. 

## 2021-02-23 NOTE — Assessment & Plan Note (Signed)
Upcoming surgery is scheduled for Monday.

## 2021-02-23 NOTE — Assessment & Plan Note (Signed)
Chronic, ongoing.  Continue current medication regimen and adjust as needed.  Current LDL well controlled.  Lipid panel today.

## 2021-02-23 NOTE — Assessment & Plan Note (Signed)
Smoker for 50 years, quit 10 years ago.  Discussed AAA screening which he has not had, will order and obtain.

## 2021-02-24 ENCOUNTER — Other Ambulatory Visit: Payer: Self-pay | Admitting: Nurse Practitioner

## 2021-02-24 DIAGNOSIS — E875 Hyperkalemia: Secondary | ICD-10-CM

## 2021-02-24 DIAGNOSIS — D649 Anemia, unspecified: Secondary | ICD-10-CM

## 2021-02-24 LAB — COMPREHENSIVE METABOLIC PANEL
ALT: 11 IU/L (ref 0–44)
AST: 12 IU/L (ref 0–40)
Albumin/Globulin Ratio: 1.9 (ref 1.2–2.2)
Albumin: 4.5 g/dL (ref 3.7–4.7)
Alkaline Phosphatase: 102 IU/L (ref 44–121)
BUN/Creatinine Ratio: 24 (ref 10–24)
BUN: 20 mg/dL (ref 8–27)
Bilirubin Total: 0.5 mg/dL (ref 0.0–1.2)
CO2: 25 mmol/L (ref 20–29)
Calcium: 9.5 mg/dL (ref 8.6–10.2)
Chloride: 100 mmol/L (ref 96–106)
Creatinine, Ser: 0.84 mg/dL (ref 0.76–1.27)
Globulin, Total: 2.4 g/dL (ref 1.5–4.5)
Glucose: 104 mg/dL — ABNORMAL HIGH (ref 70–99)
Potassium: 5.3 mmol/L — ABNORMAL HIGH (ref 3.5–5.2)
Sodium: 138 mmol/L (ref 134–144)
Total Protein: 6.9 g/dL (ref 6.0–8.5)
eGFR: 92 mL/min/{1.73_m2} (ref >59)

## 2021-02-24 LAB — CBC WITH DIFFERENTIAL/PLATELET
Basophils Absolute: 0.1 10*3/uL (ref 0.0–0.2)
Basos: 1 %
EOS (ABSOLUTE): 0.3 10*3/uL (ref 0.0–0.4)
Eos: 5 %
Hematocrit: 38.7 % (ref 37.5–51.0)
Hemoglobin: 12.6 g/dL — ABNORMAL LOW (ref 13.0–17.7)
Immature Grans (Abs): 0 10*3/uL (ref 0.0–0.1)
Immature Granulocytes: 0 %
Lymphocytes Absolute: 1.7 10*3/uL (ref 0.7–3.1)
Lymphs: 24 %
MCH: 31.3 pg (ref 26.6–33.0)
MCHC: 32.6 g/dL (ref 31.5–35.7)
MCV: 96 fL (ref 79–97)
Monocytes Absolute: 0.6 10*3/uL (ref 0.1–0.9)
Monocytes: 8 %
Neutrophils Absolute: 4.5 10*3/uL (ref 1.4–7.0)
Neutrophils: 62 %
Platelets: 201 10*3/uL (ref 150–450)
RBC: 4.02 x10E6/uL — ABNORMAL LOW (ref 4.14–5.80)
RDW: 13 % (ref 11.6–15.4)
WBC: 7.2 10*3/uL (ref 3.4–10.8)

## 2021-02-24 LAB — LIPID PANEL W/O CHOL/HDL RATIO
Cholesterol, Total: 128 mg/dL (ref 100–199)
HDL: 62 mg/dL (ref >39)
LDL Chol Calc (NIH): 52 mg/dL (ref 0–99)
Triglycerides: 65 mg/dL (ref 0–149)
VLDL Cholesterol Cal: 14 mg/dL (ref 5–40)

## 2021-02-24 LAB — TSH: TSH: 4.28 u[IU]/mL (ref 0.450–4.500)

## 2021-02-24 LAB — MAGNESIUM: Magnesium: 1.5 mg/dL — ABNORMAL LOW (ref 1.6–2.3)

## 2021-02-24 LAB — PSA: Prostate Specific Ag, Serum: 0.5 ng/mL (ref 0.0–4.0)

## 2021-02-24 NOTE — Progress Notes (Signed)
Lab appointment has been scheduled 

## 2021-02-24 NOTE — Progress Notes (Signed)
Contacted via MyChart   Good morning Johnny Wolfe, your labs have returned: - CBC shows a mildly low hemoglobin -- I would like to recheck this in 4 weeks via an outpatient lab visit please.  I will have staff call to schedule.  Any recent bleeding? Rectal bleeding? - Cholesterol levels are at goal - Thyroid and prostate labs normal - Magnesium remains mildly low, I recommend continue supplement daily - Potassium is mildly elevated at 5.3, continue your Veltassa daily and I will recheck this in 4 weeks as well.  Any questions? Keep being amazing!!  Thank you for allowing me to participate in your care.  I appreciate you. Kindest regards, Corita Allinson

## 2021-02-24 NOTE — Progress Notes (Signed)
Please schedule lab only visit for 4 weeks:)

## 2021-02-27 DIAGNOSIS — M720 Palmar fascial fibromatosis [Dupuytren]: Secondary | ICD-10-CM | POA: Diagnosis not present

## 2021-03-01 DIAGNOSIS — M25641 Stiffness of right hand, not elsewhere classified: Secondary | ICD-10-CM | POA: Diagnosis not present

## 2021-03-02 NOTE — Progress Notes (Signed)
Appointment scheduled for 03/22/2021.

## 2021-03-07 ENCOUNTER — Other Ambulatory Visit: Payer: Self-pay

## 2021-03-07 ENCOUNTER — Ambulatory Visit
Admission: RE | Admit: 2021-03-07 | Discharge: 2021-03-07 | Disposition: A | Discharge status: Home / Self Care | Payer: Medicare Other | Source: Ambulatory Visit | Attending: Nurse Practitioner | Admitting: Nurse Practitioner

## 2021-03-07 DIAGNOSIS — Z136 Encounter for screening for cardiovascular disorders: Secondary | ICD-10-CM | POA: Insufficient documentation

## 2021-03-07 DIAGNOSIS — E119 Type 2 diabetes mellitus without complications: Secondary | ICD-10-CM | POA: Diagnosis not present

## 2021-03-07 DIAGNOSIS — I700 Atherosclerosis of aorta: Secondary | ICD-10-CM | POA: Diagnosis not present

## 2021-03-08 NOTE — Progress Notes (Signed)
Contacted via MyChart   Good evening Johnny Wolfe, your screening has returned.  Good news, no aneurysm noted!!  There is some aortic atherosclerosis, which is seen on many images that we perform on patients, and is build up of plaque in aorta.  For this we recommend healthy diet and continuing your statin daily.  Any questions? Keep being amazing!!  Thank you for allowing me to participate in your care.  I appreciate you. Kindest regards, Jewel Mcafee

## 2021-03-13 ENCOUNTER — Ambulatory Visit: Payer: Medicare Other

## 2021-03-13 ENCOUNTER — Ambulatory Visit (INDEPENDENT_AMBULATORY_CARE_PROVIDER_SITE_OTHER): Payer: Medicare Other | Admitting: *Deleted

## 2021-03-13 VITALS — BP 116/69

## 2021-03-13 DIAGNOSIS — Z0000 Encounter for general adult medical examination without abnormal findings: Secondary | ICD-10-CM

## 2021-03-13 NOTE — Progress Notes (Signed)
Subjective:   Johnny Wolfe is a 75 y.o. male who presents for Medicare Annual/Subsequent preventive examination.  I connected with  Johnny Wolfe on 03/13/21 by a telephone enabled telemedicine application and verified that I am speaking with the correct person using two identifiers.   I discussed the limitations of evaluation and management by telemedicine. The patient expressed understanding and agreed to proceed.  Patient location: home  Provider location: tele-health  not in office    Review of Systems     Cardiac Risk Factors include: advanced age (>27men, >52 women);diabetes mellitus;hypertension;male gender     Objective:    Today's Vitals   03/13/21 1213  BP: 116/69   There is no height or weight on file to calculate BMI.  Advanced Directives 03/13/2021 03/11/2020 05/12/2019 03/05/2019 11/04/2017 08/02/2017 08/02/2017  Does Patient Have a Medical Advance Directive? Yes Yes No Yes Yes - No  Type of Paramedic of [place];Living will - Living will;Healthcare Power of Attorney Living will;Healthcare Power of Attorney - -  Does patient want to make changes to medical advance directive? - - - - - - -  Copy of [place] in Chart? Yes - validated most recent copy scanned in chart (See row information) Yes - validated most recent copy scanned in chart (See row information) - Yes - validated most recent copy scanned in chart (See row information) No - copy requested - -  Would patient like information on creating a medical advance directive? - - No - Patient declined - - No - Patient declined -    Current Medications (verified) Outpatient Encounter Medications as of 03/13/2021  Medication Sig   aspirin EC 81 MG tablet Take 81 mg by mouth daily.   atorvastatin (LIPITOR) 40 MG tablet Take 1 tablet (40 mg total) by mouth daily. Due for follow-up in July.   glucose blood test strip OneTouch Verio test strips    Lancet Device MISC 1 Units by Does not apply route daily as needed.   Lancets (ONETOUCH DELICA PLUS [id number]) MISC OneTouch Delica Plus Lancet 33 gauge   LANTUS SOLOSTAR 100 UNIT/ML Solostar Pen INJECT SUBCUTANEOUSLY 16  UNITS DAILY   magnesium oxide (MAG-OX) 400 (240 Mg) MG tablet TAKE 1 TABLET BY MOUTH TWICE DAILY   metFORMIN (GLUCOPHAGE) 1000 MG tablet TAKE 1 TABLET BY MOUTH  TWICE DAILY WITH A MEAL   metoprolol tartrate (LOPRESSOR) 25 MG tablet Take 0.5 tablets (12.5 mg total) by mouth 2 (two) times daily.   nitroGLYCERIN (NITROSTAT) 0.4 MG SL tablet Place 1 tablet (0.4 mg total) under the tongue every 5 (five) minutes as needed for chest pain.   patiromer (VELTASSA) 8.4 g packet TAKE THE CONTENTS OF 1 PACKET DAILY AS DIRECTED   vitamin B-12 (CYANOCOBALAMIN) 1000 MCG tablet Take 500 mcg by mouth daily.    No facility-administered encounter medications on file as of 03/13/2021.    Allergies (verified) Neosporin [bacitracin-polymyxin b], Ace inhibitors, and Neosporin [neomycin-bacitracin zn-polymyx]   History: Past Medical History:  Diagnosis Date   CAD (coronary artery disease)    a. 06/2017 Cardiac CT: Ca2+ score of 2310 - 95th%'ile;  b. 07/2017 Cath/PCI: LM nl, LAD 60p, 28m, LCX 60p, OM2 40p, RCA dominant, 80p (3.5x15 [place] DES), 83m, 30d, EF 55-65%.   Cataract    Diabetes mellitus without complication ([place])    Essential hypertension    Hyperlipidemia    Syncope    Wears dentures    full upper  and lower   Past Surgical History:  Procedure Laterality Date   APPENDECTOMY     CARDIAC CATHETERIZATION     CATARACT EXTRACTION, BILATERAL     August 2017   CORONARY STENT INTERVENTION N/A 08/02/2017   Procedure: CORONARY STENT INTERVENTION;  Surgeon: Wellington Hampshire, MD;  Location: [facility];  Service: Cardiovascular;  Laterality: N/A;   LEFT HEART CATH AND CORONARY ANGIOGRAPHY Left 08/02/2017   Procedure: LEFT HEART CATH AND CORONARY ANGIOGRAPHY;  Surgeon: Minna Merritts, MD;  Location: [facility];  Service: Cardiovascular;  Laterality: Left;   MASS EXCISION Left 07/04/2017   Procedure: REMOVAL TWO CYST EAR EAR LOBE AND EXTERNAL EAR;  Surgeon: Beverly Gust, MD;  Location: [facility];  Service: ENT;  Laterality: Left;  LOCAL Diabetic - insulin and oral meds   upper jaw Right 06/2019   at [facility] History  Problem Relation Age of Onset   Diabetes Mother    Arrhythmia Mother 57       Pacemaker implant    Cancer Father        lung   Lung cancer Sister    Kidney cancer Sister    Social History   Socioeconomic History   Marital status: Married    Spouse name: Not on file   Number of children: Not on file   Years of education: Not on file   Highest education level: Not on file  Occupational History   Not on file  Tobacco Use   Smoking status: Former    Packs/day: 1.00    Types: Cigarettes    Quit date: 10/27/2011    Years since quitting: 9.3   Smokeless tobacco: Never  Vaping Use   Vaping Use: Never used  Substance and Sexual Activity   Alcohol use: Yes    Alcohol/week: 7.0 - 11.0 standard drinks    Types: 1 Glasses of wine, 6 - 10 Shots of liquor per week    Comment: weekly   Drug use: No   Sexual activity: Not Currently  Other Topics Concern   Not on file  Social History Narrative   Not on file   Social Determinants of Health   Financial Resource Strain: Low Risk    Difficulty of Paying Living Expenses: Not hard at all  Food Insecurity: No Food Insecurity   Worried About Charity fundraiser in the Last Year: Never true   Ran Out of Food in the Last Year: Never true  Transportation Needs: No Transportation Needs   Lack of Transportation (Medical): No   Lack of Transportation (Non-Medical): No  Physical Activity: Inactive   Days of Exercise per Week: 0 days   Minutes of Exercise per Session: 0 min  Stress: No Stress Concern Present   Feeling of Stress : Not at all  Social Connections: Moderately  Isolated   Frequency of Communication with Friends and Family: Once a week   Frequency of Social Gatherings with Friends and Family: More than three times a week   Attends Religious Services: Never   Marine scientist or Organizations: No   Attends Music therapist: Never   Marital Status: Married    Tobacco Counseling Counseling given: Not Answered   Clinical Intake:  Pre-visit preparation completed: Yes  Pain : No/denies pain     Nutritional Risks: None Diabetes: Yes CBG done?: No Did pt. bring in CBG monitor from home?: No  How often do you need  to have someone help you when you read instructions, pamphlets, or other written materials from your doctor or pharmacy?: 1 - Never  Diabetic?yes    Nutrition Risk Assessment:  Has the patient had any N/V/D within the last 2 months?  No  Does the patient have any non-healing wounds?  No  Has the patient had any unintentional weight loss or weight gain?  No   Diabetes:  Is the patient diabetic?  Yes  If diabetic, was a CBG obtained today?  No  Did the patient bring in their glucometer from home?  No  How often do you monitor your CBG's? 1x a day.   Financial Strains and Diabetes Management:  Are you having any financial strains with the device, your supplies or your medication? No .  Does the patient want to be seen by Chronic Care Management for management of their diabetes?  No  Would the patient like to be referred to a Nutritionist or for Diabetic Management?  No   Diabetic Exams:  Diabetic Eye Exam: Completed . Overdue for diabetic eye exam. Pt has been advised about the importance in completing this exam Diabetic Foot Exam: Completed . Pt has been advised about the importance in completing this exam.  Interpreter Needed?: No  Information entered by :: Leroy Kennedy LPN   Activities of Daily Living In your present state of health, do you have any difficulty performing the following activities:  03/13/2021 02/23/2021  Hearing? Y N  Vision? N N  Difficulty concentrating or making decisions? N N  Walking or climbing stairs? N N  Dressing or bathing? N N  Doing errands, shopping? N N  Preparing Food and eating ? N -  Using the Toilet? N -  In the past six months, have you accidently leaked urine? N -  Do you have problems with loss of bowel control? N -  Managing your Medications? N -  Managing your Finances? N -  Some recent data might be hidden    Patient Care Team: Venita Lick, NP as PCP - General (Nurse Practitioner) Minna Merritts, MD as PCP - Cardiology (Cardiology)  Indicate any recent Medical Services you may have received from other than [facility] providers in the past year (date may be approximate).     Assessment:   This is a routine wellness examination for [place].  Hearing/Vision screen Hearing Screening - Comments:: No hearing aids Right hearing loss Vision Screening - Comments:: [place] Up to date  Dietary issues and exercise activities discussed:     Goals Addressed             This Visit's Progress    Patient Stated       Maintain current lifestyle       Depression Screen PHQ 2/9 Scores 03/13/2021 02/23/2021 05/19/2020 03/11/2020 03/05/2019 02/18/2019 11/04/2017  PHQ - 2 Score 0 0 0 0 0 0 0  PHQ- 9 Score - 0 - - - 0 -    Fall Risk Fall Risk  03/13/2021 02/23/2021 02/23/2021 05/19/2020 03/11/2020  Falls in the past year? 0 0 0 0 1  Comment - - - - tripped, passed out due to low BP  Number falls in past yr: 0 0 0 - 1  Injury with Fall? 0 0 1 - 1  Comment - - - - broke cheek bone  Risk for fall due to : - No Fall Risks No Fall Risks - History of fall(s);Medication side effect  Follow up  Falls evaluation completed;Falls prevention discussed Falls prevention discussed Falls evaluation completed - Falls evaluation completed;Education provided;Falls prevention discussed    FALL RISK PREVENTION PERTAINING TO THE HOME:  Any  stairs in or around the home? No  If so, are there any without handrails? No  Home free of loose throw rugs in walkways, pet beds, electrical cords, etc? No  Adequate lighting in your home to reduce risk of falls? No   ASSISTIVE DEVICES UTILIZED TO PREVENT FALLS:  Life alert? No  Use of a cane, walker or w/c? No  Grab bars in the bathroom? Yes  Shower chair or bench in shower? Yes  Elevated toilet seat or a handicapped toilet? No   TIMED UP AND GO:  Was the test performed? No .    Cognitive Function:  Normal cognitive status assessed by direct observation by this Nurse Health Advisor. No abnormalities found.       6CIT Screen 02/23/2021 03/11/2020 02/18/2019 11/04/2017  What Year? 0 points 0 points 0 points 0 points  What month? 0 points 0 points 0 points 0 points  What time? 0 points 0 points 0 points 0 points  Count back from 20 0 points 0 points 0 points 0 points  Months in reverse 0 points 0 points 0 points 0 points  Repeat phrase 2 points 2 points 0 points 0 points  Total Score 2 2 0 0    Immunizations Immunization History  Administered Date(s) Administered   Fluad Quad(high Dose 65+) 01/28/2019, 02/16/2020   Influenza, High Dose Seasonal PF 05/10/2016, 03/15/2017, 01/07/2018   Influenza-Unspecified 03/15/2017, 02/10/2021   Moderna SARS-COV2 Booster Vaccination 08/18/2020   Moderna Sars-Covid-2 Vaccination 06/12/2019, 07/11/2019, 02/22/2020, 02/18/2021   Pneumococcal Conjugate-13 10/30/2013   Pneumococcal Polysaccharide-23 06/10/2017   Td 10/31/2010   Tdap 11/18/2020   Zoster Recombinat (Shingrix) 05/02/2018, 07/21/2018   Zoster, Live 10/30/2013    TDAP status: Up to date  Flu Vaccine status: Up to date  Pneumococcal vaccine status: Up to date  Covid-19 vaccine status: Completed vaccines  Qualifies for Shingles Vaccine? No   Zostavax completed Yes   Shingrix Completed?: Yes  Screening Tests Health Maintenance  Topic Date Due   COVID-19 Vaccine (5 -  Booster for Moderna series) 04/15/2021   OPHTHALMOLOGY EXAM  04/18/2021   HEMOGLOBIN A1C  08/24/2021   FOOT EXAM  11/18/2021   URINE MICROALBUMIN  02/23/2022   COLONOSCOPY (Pts 45-20yrs Insurance coverage will need to be confirmed)  10/28/2024   TETANUS/TDAP  11/19/2030   Pneumonia Vaccine 32+ Years old  Completed   INFLUENZA VACCINE  Completed   Hepatitis C Screening  Completed   Zoster Vaccines- Shingrix  Completed   HPV VACCINES  Aged Out    Health Maintenance  There are no preventive care reminders to display for this patient.  Colorectal cancer screening: Type of screening: Colonoscopy. Completed 2016. Repeat every 10 years  Lung Cancer Screening: (Low Dose CT Chest recommended if Age 34-80 years, 30 pack-year currently smoking OR have quit w/in 15years.) does not qualify.   Lung Cancer Screening Referral:   Additional Screening:  Hepatitis C Screening: does not qualify; Completed 2019  Vision Screening: Recommended annual ophthalmology exams for early detection of glaucoma and other disorders of the eye. Is the patient up to date with their annual eye exam?  Yes  Who is the provider or what is the name of the office in which the patient attends annual eye exams? [facility] If pt is not established with  a provider, would they like to be referred to a provider to establish care? No .   Dental Screening: Recommended annual dental exams for proper oral hygiene  Community Resource Referral / Chronic Care Management: CRR required this visit?  No   CCM required this visit?  No      Plan:     I have personally reviewed and noted the following in the patient's chart:   Medical and social history Use of alcohol, tobacco or illicit drugs  Current medications and supplements including opioid prescriptions. Patient is not currently taking opioid prescriptions. Functional ability and status Nutritional status Physical activity Advanced directives List of other  physicians Hospitalizations, surgeries, and ER visits in previous 12 months Vitals Screenings to include cognitive, depression, and falls Referrals and appointments  In addition, I have reviewed and discussed with patient certain preventive protocols, quality metrics, and best practice recommendations. A written personalized care plan for preventive services as well as general preventive health recommendations were provided to patient.     Leroy Kennedy, LPN   06/77/0340   Nurse Notes:

## 2021-03-22 ENCOUNTER — Other Ambulatory Visit: Payer: Medicare Other

## 2021-03-22 ENCOUNTER — Other Ambulatory Visit: Payer: Self-pay

## 2021-03-22 DIAGNOSIS — E875 Hyperkalemia: Secondary | ICD-10-CM | POA: Diagnosis not present

## 2021-03-22 DIAGNOSIS — D649 Anemia, unspecified: Secondary | ICD-10-CM | POA: Diagnosis not present

## 2021-03-23 LAB — CBC WITH DIFFERENTIAL/PLATELET
Basophils Absolute: 0.1 10*3/uL (ref 0.0–0.2)
Basos: 1 %
EOS (ABSOLUTE): 0.3 10*3/uL (ref 0.0–0.4)
Eos: 4 %
Hematocrit: 39 % (ref 37.5–51.0)
Hemoglobin: 12.7 g/dL — ABNORMAL LOW (ref 13.0–17.7)
Immature Grans (Abs): 0 10*3/uL (ref 0.0–0.1)
Immature Granulocytes: 0 %
Lymphocytes Absolute: 1.6 10*3/uL (ref 0.7–3.1)
Lymphs: 25 %
MCH: 30.9 pg (ref 26.6–33.0)
MCHC: 32.6 g/dL (ref 31.5–35.7)
MCV: 95 fL (ref 79–97)
Monocytes Absolute: 0.5 10*3/uL (ref 0.1–0.9)
Monocytes: 8 %
Neutrophils Absolute: 3.8 10*3/uL (ref 1.4–7.0)
Neutrophils: 62 %
Platelets: 212 10*3/uL (ref 150–450)
RBC: 4.11 x10E6/uL — ABNORMAL LOW (ref 4.14–5.80)
RDW: 13.1 % (ref 11.6–15.4)
WBC: 6.3 10*3/uL (ref 3.4–10.8)

## 2021-03-23 LAB — IRON,TIBC AND FERRITIN PANEL
Ferritin: 44 ng/mL (ref 30–400)
Iron Saturation: 22 % (ref 15–55)
Iron: 82 ug/dL (ref 38–169)
Total Iron Binding Capacity: 377 ug/dL (ref 250–450)
UIBC: 295 ug/dL (ref 111–343)

## 2021-03-23 LAB — POTASSIUM: Potassium: 4.8 mmol/L (ref 3.5–5.2)

## 2021-03-23 LAB — MAGNESIUM: Magnesium: 1.6 mg/dL (ref 1.6–2.3)

## 2021-03-23 NOTE — Progress Notes (Signed)
Contacted via [facility] to your family and you!!  You labs have returned, potassium and magnesium levels are now in normal range, continue supplements and diet focus.  CBC continues to show mildly low hemoglobin, but is trending up, and hematocrit normal.  Iron level normal.  We will recheck next visit, ensure good iron rich food intake daily and alert me if you notice any blood in stool, increased fatigue, or shortness of breath.  Any questions? Keep being awesome!!  Thank you for allowing me to participate in your care.  I appreciate you. Kindest regards, Ramell Wacha

## 2021-03-29 DIAGNOSIS — L570 Actinic keratosis: Secondary | ICD-10-CM | POA: Diagnosis not present

## 2021-04-06 ENCOUNTER — Other Ambulatory Visit: Payer: Self-pay

## 2021-04-06 DIAGNOSIS — E1165 Type 2 diabetes mellitus with hyperglycemia: Secondary | ICD-10-CM

## 2021-04-06 DIAGNOSIS — Z794 Long term (current) use of insulin: Secondary | ICD-10-CM

## 2021-04-06 MED ORDER — GLUCOSE BLOOD VI STRP: ORAL_STRIP | 12 refills | Status: DC

## 2021-04-06 MED ORDER — ONETOUCH VERIO W/DEVICE KIT: 1.0000 | PACK | Freq: Every day | 0 refills | Status: DC

## 2021-04-06 NOTE — Telephone Encounter (Signed)
Received fax prescription request via [facility] requesting new prescription for One Touch Verio device and supplies. New prescription was sent per requested to [facility].

## 2021-04-12 ENCOUNTER — Other Ambulatory Visit: Payer: Self-pay | Admitting: Nurse Practitioner

## 2021-04-12 DIAGNOSIS — Z794 Long term (current) use of insulin: Secondary | ICD-10-CM

## 2021-04-12 DIAGNOSIS — E1165 Type 2 diabetes mellitus with hyperglycemia: Secondary | ICD-10-CM

## 2021-04-13 NOTE — Telephone Encounter (Signed)
A duplicate refill request was sent from [place] from [facility] for the One Touch Verio w/Device Kit. I called to see why a duplicate request was sent.   I was told to disregard this request that the pt had put in another request for the same rx.    This rx was shipped yesterday 04/12/2021.  So I have refused this refill request since it was shipped yesterday.

## 2021-04-20 DIAGNOSIS — E119 Type 2 diabetes mellitus without complications: Secondary | ICD-10-CM | POA: Diagnosis not present

## 2021-04-20 LAB — HM DIABETES EYE EXAM

## 2021-05-26 ENCOUNTER — Encounter: Payer: Self-pay | Admitting: Nurse Practitioner

## 2021-05-26 ENCOUNTER — Other Ambulatory Visit: Payer: Self-pay

## 2021-05-26 ENCOUNTER — Ambulatory Visit (INDEPENDENT_AMBULATORY_CARE_PROVIDER_SITE_OTHER): Payer: Medicare HMO | Admitting: Nurse Practitioner

## 2021-05-26 VITALS — BP 125/70 | HR 65 | Temp 98.1°F | Ht 72.5 in | Wt 172.2 lb

## 2021-05-26 DIAGNOSIS — Z794 Long term (current) use of insulin: Secondary | ICD-10-CM | POA: Diagnosis not present

## 2021-05-26 DIAGNOSIS — I152 Hypertension secondary to endocrine disorders: Secondary | ICD-10-CM | POA: Diagnosis not present

## 2021-05-26 DIAGNOSIS — E1159 Type 2 diabetes mellitus with other circulatory complications: Secondary | ICD-10-CM | POA: Diagnosis not present

## 2021-05-26 DIAGNOSIS — I25119 Atherosclerotic heart disease of native coronary artery with unspecified angina pectoris: Secondary | ICD-10-CM

## 2021-05-26 DIAGNOSIS — E785 Hyperlipidemia, unspecified: Secondary | ICD-10-CM

## 2021-05-26 DIAGNOSIS — E875 Hyperkalemia: Secondary | ICD-10-CM

## 2021-05-26 DIAGNOSIS — I700 Atherosclerosis of aorta: Secondary | ICD-10-CM

## 2021-05-26 DIAGNOSIS — E1169 Type 2 diabetes mellitus with other specified complication: Secondary | ICD-10-CM | POA: Diagnosis not present

## 2021-05-26 DIAGNOSIS — E1165 Type 2 diabetes mellitus with hyperglycemia: Secondary | ICD-10-CM

## 2021-05-26 LAB — MICROALBUMIN, URINE WAIVED
Creatinine, Urine Waived: 50 mg/dL (ref 10–300)
Microalb, Ur Waived: 10 mg/L (ref 0–19)

## 2021-05-26 LAB — BAYER DCA HB A1C WAIVED: HB A1C (BAYER DCA - WAIVED): 6.2 % — ABNORMAL HIGH (ref 4.8–5.6)

## 2021-05-26 NOTE — Assessment & Plan Note (Signed)
Chronic, stable with no recent NTG use.  Continue current medication regimen and collaboration with cardiology. 

## 2021-05-26 NOTE — Assessment & Plan Note (Signed)
Noted to be trending down with addition of Veltassa, last level 4.8.  Continue current medication regimen -- full work-up performed in April 2022 and all WNL.  Consider referral to nephrology or endocrinology as needed.  Discussed with patient.

## 2021-05-26 NOTE — Patient Instructions (Signed)

## 2021-05-26 NOTE — Assessment & Plan Note (Signed)
Noted on past imaging, continue statin and ASA daily for prevention.

## 2021-05-26 NOTE — Assessment & Plan Note (Addendum)
Chronic, ongoing with A1C continuing at goal 6.2%%.  Urine ALB 10 today (January 2023).  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  Continue to monitor BS at home and a post prandial check. Return in 3 months.

## 2021-05-26 NOTE — Assessment & Plan Note (Signed)
Chronic, ongoing.  Continue current medication regimen and adjust as needed.  Current LDL well controlled.  Lipid panel today.

## 2021-05-26 NOTE — Assessment & Plan Note (Signed)
Chronic, ongoing.  BP at goal today in office.  Continue current medication regimen and collaboration with cardiology.  Adjust as needed.  Recommend he continue to monitor BP occasionally at home and document + focus on DASH diet.  CMP, CBC, and urine ALB today.  Return in 3 months.

## 2021-05-26 NOTE — Progress Notes (Signed)
BP 125/70    Pulse 65    Temp 98.1 F (36.7 C) (Oral)    Ht 6' 0.5" (1.842 m)    Wt 172 lb 3.2 oz (78.1 kg)    SpO2 94%    BMI 23.03 kg/m    Subjective:    Patient ID: Johnny Wolfe, male    DOB: [birth year], 76 y.o.   MRN: [id number]  HPI: Johnny Wolfe is a 76 y.o. male  Chief Complaint  Patient presents with   Diabetes   Hyperlipidemia   Hypertension   DIABETES Last A1c 6.2% in October.  Continues on Metformin 1000 MG BID and Lantus 14 units.  He is very strict with his diet and medication regimen.  Have tried GLP1 and SGLT, but this was not beneficial -- he lost weight with changes in medications and felt tired. Glutamic acid <5.0 and C peptide 2.1 in 2021 on check -- ?Type 1 1/2 vs Type 2. Hypoglycemic episodes: 50 this morning -- this is the first time he had a low, he ate a candy and returned to normal Polydipsia/polyuria: no Visual disturbance: no Chest pain: no Paresthesias: no Glucose Monitoring: yes             Accucheck frequency: Daily             Fasting glucose: on average >100, 153 is the highest it has been             Post prandial:              Evening:             Before meals: Taking Insulin?: no             Long acting insulin: Lantus 14 units             Short acting insulin: Blood Pressure Monitoring: daily Retinal Examination: Up to Date Foot Exam: Up to Date Pneumovax: Up to Date Influenza: Up to Date Aspirin: yes   HYPERTENSION / HYPERLIPIDEMIA Continues on ASA and Metoprolol + Lipitor. Last saw cardiology 11/04/20. Has history of cardiac cath with stent to proximal RCA in April 2019.  Continues on Valtessa for hyperkalemia --- recent K+ 4.8, work-up for past elevations showed WNL labs.    Never has used NTG.  Has history of aortic atherosclerosis noted on past imaging.   Satisfied with current treatment? yes Duration of hypertension: chronic BP monitoring frequency: not checking BP range: not checking BP medication side effects: no Duration  of hyperlipidemia: chronic Cholesterol medication side effects: no Cholesterol supplements: none Medication compliance: good compliance Aspirin: yes Recent stressors: no Recurrent headaches: no Visual changes: no Palpitations: no Dyspnea: no Chest pain: no Lower extremity edema: no Dizzy/lightheaded: no  Relevant past medical, surgical, family and social history reviewed and updated as indicated. Interim medical history since our last visit reviewed. Allergies and medications reviewed and updated.  Review of Systems  Constitutional:  Negative for activity change, diaphoresis, fatigue and fever.  Respiratory:  Negative for cough, chest tightness, shortness of breath and wheezing.   Cardiovascular:  Negative for chest pain, palpitations and leg swelling.  Gastrointestinal: Negative.   Endocrine: Negative for polydipsia, polyphagia and polyuria.  Neurological: Negative.   Psychiatric/Behavioral: Negative.     Per HPI unless specifically indicated above     Objective:    BP 125/70    Pulse 65    Temp 98.1 F (36.7 C) (Oral)    Ht 6' 0.5" (1.842 m)  Wt 172 lb 3.2 oz (78.1 kg)    SpO2 94%    BMI 23.03 kg/m   Wt Readings from Last 3 Encounters:  05/26/21 172 lb 3.2 oz (78.1 kg)  02/23/21 167 lb 12.8 oz (76.1 kg)  11/18/20 166 lb 3.2 oz (75.4 kg)    Physical Exam Vitals and nursing note reviewed.  Constitutional:      General: He is awake. He is not in acute distress.    Appearance: He is well-developed and well-groomed. He is not ill-appearing.  HENT:     Head: Normocephalic and atraumatic.     Right Ear: Hearing normal. No drainage.     Left Ear: Hearing normal. No drainage.  Eyes:     General: Lids are normal.        Right eye: No discharge.        Left eye: No discharge.     Conjunctiva/sclera: Conjunctivae normal.     Pupils: Pupils are equal, round, and reactive to light.  Neck:     Vascular: No carotid bruit.  Cardiovascular:     Rate and Rhythm: Normal rate  and regular rhythm.     Heart sounds: Normal heart sounds, S1 normal and S2 normal. No murmur heard.   No gallop.  Pulmonary:     Effort: Pulmonary effort is normal. No accessory muscle usage or respiratory distress.     Breath sounds: Normal breath sounds.  Abdominal:     General: Bowel sounds are normal.     Palpations: Abdomen is soft.  Musculoskeletal:        General: Normal range of motion.     Cervical back: Normal range of motion and neck supple.     Right lower leg: No edema.     Left lower leg: No edema.  Skin:    General: Skin is warm and dry.  Neurological:     Mental Status: He is alert and oriented to person, place, and time.  Psychiatric:        Attention and Perception: Attention normal.        Mood and Affect: Mood normal.        Speech: Speech normal.        Behavior: Behavior normal. Behavior is cooperative.        Thought Content: Thought content normal.   Results for orders placed or performed in visit on 05/23/21  HM DIABETES EYE EXAM  Result Value Ref Range   HM Diabetic Eye Exam No Retinopathy No Retinopathy      Assessment & Plan:   Problem List Items Addressed This Visit       Cardiovascular and Mediastinum   Aortic atherosclerosis ([place])    Noted on past imaging, continue statin and ASA daily for prevention.      Relevant Orders   Lipid Panel w/o Chol/HDL Ratio   Coronary artery disease involving native coronary artery with angina pectoris (HCC)    Chronic, stable with no recent NTG use.  Continue current medication regimen and collaboration with cardiology.      Relevant Orders   Comprehensive metabolic panel   Lipid Panel w/o Chol/HDL Ratio   Hypertension associated with diabetes (HCC)    Chronic, ongoing.  BP at goal today in office.  Continue current medication regimen and collaboration with cardiology.  Adjust as needed.  Recommend he continue to monitor BP occasionally at home and document + focus on DASH diet.  CMP, CBC, and urine ALB  today.  Return in  3 months.       Relevant Orders   Bayer DCA Hb A1c Waived   Microalbumin, Urine Waived   Comprehensive metabolic panel   CBC with Differential/Platelet     Endocrine   Hyperlipidemia associated with type 2 diabetes mellitus (HCC)    Chronic, ongoing.  Continue current medication regimen and adjust as needed.  Current LDL well controlled.  Lipid panel today.      Relevant Orders   Bayer DCA Hb A1c Waived   Comprehensive metabolic panel   Lipid Panel w/o Chol/HDL Ratio   Type 2 diabetes mellitus with hyperglycemia (HCC) - Primary    Chronic, ongoing with A1C continuing at goal 6.2%%.  Urine ALB 10 today (January 2023).  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  Continue to monitor BS at home and a post prandial check. Return in 3 months.      Relevant Orders   Bayer DCA Hb A1c Waived   Microalbumin, Urine Waived     Other   Hyperkalemia    Noted to be trending down with addition of Veltassa, last level 4.8.  Continue current medication regimen -- full work-up performed in April 2022 and all WNL.  Consider referral to nephrology or endocrinology as needed.  Discussed with patient.      Relevant Orders   Comprehensive metabolic panel   Magnesium     Follow up plan: Return in about 3 months (around 08/24/2021) for T2DM, HTN/HLD.

## 2021-05-27 ENCOUNTER — Other Ambulatory Visit: Payer: Self-pay | Admitting: Nurse Practitioner

## 2021-05-27 DIAGNOSIS — E875 Hyperkalemia: Secondary | ICD-10-CM

## 2021-05-27 LAB — CBC WITH DIFFERENTIAL/PLATELET
Basophils Absolute: 0.1 10*3/uL (ref 0.0–0.2)
Basos: 1 %
EOS (ABSOLUTE): 0.3 10*3/uL (ref 0.0–0.4)
Eos: 3 %
Hematocrit: 42 % (ref 37.5–51.0)
Hemoglobin: 13.9 g/dL (ref 13.0–17.7)
Immature Grans (Abs): 0 10*3/uL (ref 0.0–0.1)
Immature Granulocytes: 0 %
Lymphocytes Absolute: 1.6 10*3/uL (ref 0.7–3.1)
Lymphs: 19 %
MCH: 32.1 pg (ref 26.6–33.0)
MCHC: 33.1 g/dL (ref 31.5–35.7)
MCV: 97 fL (ref 79–97)
Monocytes Absolute: 0.6 10*3/uL (ref 0.1–0.9)
Monocytes: 7 %
Neutrophils Absolute: 5.8 10*3/uL (ref 1.4–7.0)
Neutrophils: 70 %
Platelets: 222 10*3/uL (ref 150–450)
RBC: 4.33 x10E6/uL (ref 4.14–5.80)
RDW: 12.8 % (ref 11.6–15.4)
WBC: 8.3 10*3/uL (ref 3.4–10.8)

## 2021-05-27 LAB — COMPREHENSIVE METABOLIC PANEL
ALT: 11 IU/L (ref 0–44)
AST: 12 IU/L (ref 0–40)
Albumin/Globulin Ratio: 1.9 (ref 1.2–2.2)
Albumin: 4.6 g/dL (ref 3.7–4.7)
Alkaline Phosphatase: 109 IU/L (ref 44–121)
BUN/Creatinine Ratio: 21 (ref 10–24)
BUN: 19 mg/dL (ref 8–27)
Bilirubin Total: 0.5 mg/dL (ref 0.0–1.2)
CO2: 25 mmol/L (ref 20–29)
Calcium: 10.3 mg/dL — ABNORMAL HIGH (ref 8.6–10.2)
Chloride: 98 mmol/L (ref 96–106)
Creatinine, Ser: 0.89 mg/dL (ref 0.76–1.27)
Globulin, Total: 2.4 g/dL (ref 1.5–4.5)
Glucose: 78 mg/dL (ref 70–99)
Potassium: 5.8 mmol/L — ABNORMAL HIGH (ref 3.5–5.2)
Sodium: 136 mmol/L (ref 134–144)
Total Protein: 7 g/dL (ref 6.0–8.5)
eGFR: 90 mL/min/{1.73_m2} (ref >59)

## 2021-05-27 LAB — LIPID PANEL W/O CHOL/HDL RATIO
Cholesterol, Total: 126 mg/dL (ref 100–199)
HDL: 66 mg/dL (ref >39)
LDL Chol Calc (NIH): 44 mg/dL (ref 0–99)
Triglycerides: 80 mg/dL (ref 0–149)
VLDL Cholesterol Cal: 16 mg/dL (ref 5–40)

## 2021-05-27 LAB — MAGNESIUM: Magnesium: 1.6 mg/dL (ref 1.6–2.3)

## 2021-05-27 NOTE — Progress Notes (Signed)
Contacted via [facility]  -- needs 2 week lab visit only please Good morning Johnny Wolfe, your labs have returned.  Potassium level has crept up a little again, I would like to recheck this in 2 weeks via outpatient labs again.  Continue Veltassa, I have written a letter to insurance to continue working on coverage for this, plus focus heavily on diet.  Calcium mildly up, will recheck this too.  Magnesium level normal. CBC normal with no anemia.  Cholesterol levels are at goal.  Any questions? Keep being awesome!!  Thank you for allowing me to participate in your care.  I appreciate you. Kindest regards, Lanay Zinda

## 2021-05-29 NOTE — Progress Notes (Signed)
Lmom for patient to call back to schedule lab only visit.

## 2021-06-12 ENCOUNTER — Other Ambulatory Visit: Payer: Self-pay

## 2021-06-12 ENCOUNTER — Other Ambulatory Visit: Payer: Medicare HMO

## 2021-06-12 DIAGNOSIS — E875 Hyperkalemia: Secondary | ICD-10-CM

## 2021-06-13 LAB — PTH, INTACT AND CALCIUM
Calcium: 10 mg/dL (ref 8.6–10.2)
PTH: 23 pg/mL (ref 15–65)

## 2021-06-13 LAB — POTASSIUM: Potassium: 4.8 mmol/L (ref 3.5–5.2)

## 2021-06-13 NOTE — Progress Notes (Signed)
Contacted via MyChart   Good morning Carlson, your labs have returned with exception of parathyroid which is still pending. I will alert you if this is concerning.  Overall though calcium has come down to normal range and potassium trended down one whole point.  So looking better right now:)  Any questions? Keep being stellar!!  Thank you for allowing me to participate in your care.  I appreciate you. Kindest regards, Tate Jerkins

## 2021-08-20 NOTE — Patient Instructions (Signed)
Hyperkalemia Hyperkalemia is when you have too much of a mineral called potassium in your blood. If there is too much potassium in your blood, it can affect how your heart works. Potassium is normally removed from your body by your kidneys. What are the causes? Taking in too much potassium. This can happen if: You use salt substitutes. You take potassium supplements. You eat too many foods that are high in potassium if you have kidney disease. Your body not being able to get rid of potassium. This can happen if: Your kidneys are not working properly. You are taking certain medicines. You have a condition called Addison's disease. You have kidney stones. You are on treatment to clean your blood (dialysis) and you skip a treatment. Your cells releasing a high amount of potassium into the blood. This can happen if: You have a muscle injury. You have very bad burns or infections. You have problems with your blood plasma. This can be caused by diabetes that is not well controlled. What increases the risk? Drinking too much alcohol. Using drugs a lot. What are the signs or symptoms? In many cases, there are no symptoms. But, when your potassium level becomes high enough, you may have symptoms such as: An irregular heartbeat. A very slow heartbeat. Feeling like you may vomit (nauseous). Tiredness. Confusion. Tingling of your skin. Numbness of your hands or feet. Muscle cramps. Muscle weakness. Not being able to move (paralysis). How is this treated? Treatment depends on how bad your condition is. Treatment may need to be done in the hospital. It may include: Receiving a fluid with sugar (glucose) in it through an IV tube, along with insulin. Taking medicines. Having treatment to clean your blood. Taking calcium. Follow these instructions at home:  Take over-the-counter and prescription medicines only as told by your doctor. Do not take any of the following unless your doctor says it  is okay: Supplements. Natural food products. Herbs. Vitamins. If you drink alcohol, limit how much you have as told by your doctor. Do not use illegal drugs. If you need help quitting, ask your doctor. If you have kidney disease, you may need to follow a low-potassium diet. A food expert (dietitian) can help you. Keep all follow-up visits. Contact a doctor if: Your heartbeat is not regular or is very slow. You feel dizzy. You feel weak. You feel like you may vomit. You have tingling in your hands or feet. You lose feeling in your hands or feet. Get help right away if: You are short of breath. You have chest pain. You faint. You cannot move your muscles. These symptoms may be an emergency. Get help right away. Call 911. Do not wait to see if the symptoms will go away. Do not drive yourself to the hospital. Summary Hyperkalemia is when you have too much potassium in your blood. Take over-the-counter and prescription medicines only as told by your doctor. Limit how much alcohol you have as told by your doctor. Contact a doctor if your heartbeat is not regular. This information is not intended to replace advice given to you by your health care provider. Make sure you discuss any questions you have with your health care provider. Document Revised: 12/29/2020 Document Reviewed: 12/29/2020 Elsevier Patient Education  2023 Elsevier Inc.    

## 2021-08-24 ENCOUNTER — Encounter: Payer: Self-pay | Admitting: Nurse Practitioner

## 2021-08-24 ENCOUNTER — Ambulatory Visit (INDEPENDENT_AMBULATORY_CARE_PROVIDER_SITE_OTHER): Payer: Medicare HMO | Admitting: Nurse Practitioner

## 2021-08-24 VITALS — BP 110/66 | HR 70 | Temp 97.8°F | Ht 72.5 in | Wt 171.4 lb

## 2021-08-24 DIAGNOSIS — E875 Hyperkalemia: Secondary | ICD-10-CM | POA: Diagnosis not present

## 2021-08-24 DIAGNOSIS — I152 Hypertension secondary to endocrine disorders: Secondary | ICD-10-CM | POA: Diagnosis not present

## 2021-08-24 DIAGNOSIS — E1169 Type 2 diabetes mellitus with other specified complication: Secondary | ICD-10-CM

## 2021-08-24 DIAGNOSIS — I700 Atherosclerosis of aorta: Secondary | ICD-10-CM

## 2021-08-24 DIAGNOSIS — I25119 Atherosclerotic heart disease of native coronary artery with unspecified angina pectoris: Secondary | ICD-10-CM

## 2021-08-24 DIAGNOSIS — E1165 Type 2 diabetes mellitus with hyperglycemia: Secondary | ICD-10-CM

## 2021-08-24 DIAGNOSIS — E1159 Type 2 diabetes mellitus with other circulatory complications: Secondary | ICD-10-CM | POA: Diagnosis not present

## 2021-08-24 DIAGNOSIS — E785 Hyperlipidemia, unspecified: Secondary | ICD-10-CM | POA: Diagnosis not present

## 2021-08-24 DIAGNOSIS — Z794 Long term (current) use of insulin: Secondary | ICD-10-CM

## 2021-08-24 LAB — BAYER DCA HB A1C WAIVED: HB A1C (BAYER DCA - WAIVED): 6 % — ABNORMAL HIGH (ref 4.8–5.6)

## 2021-08-24 NOTE — Progress Notes (Signed)
? ?BP 110/66   Pulse 70   Temp 97.8 ?F (36.6 ?C) (Oral)   Ht 6' 0.5" (1.842 m)   Wt 171 lb 6.4 oz (77.7 kg)   SpO2 96%   BMI 22.93 kg/m?   ? ?Subjective:  ? ? Patient ID: Johnny Wolfe, male    DOB: 06/21/1945, 76 y.o.   MRN: [id number] ? ?HPI: ?Johnny Wolfe is a 76 y.o. male ? ?Chief Complaint  ?Patient presents with  ? Diabetes  ? Hyperlipidemia  ? Hypertension  ? ?DIABETES ?Last A1c 6.2% January.  Continues on Metformin 1000 MG BID and Lantus 14 units.  He is very strict with his diet and medication regimen. ? ?In past tried GLP1 and SGLT, but this was not beneficial -- he lost weight with changes in medications and felt tired. Glutamic acid <5.0 and C peptide 2.1 in 2021 on check -- ?Type 1 1/2 vs Type 2. ?Hypoglycemic episodes: none ?Polydipsia/polyuria: no ?Visual disturbance: no ?Chest pain: no ?Paresthesias: no ?Glucose Monitoring: yes ?            Accucheck frequency: Daily ?            Fasting glucose: on average <130 ?            Post prandial:  ?            Evening: ?            Before meals: ?Taking Insulin?: no ?            Long acting insulin: Lantus 14 units ?            Short acting insulin: ?Blood Pressure Monitoring: daily ?Retinal Examination: Up to Date - [place] ?Foot Exam: Up to Date ?Pneumovax: Up to Date ?Influenza: Up to Date ?Aspirin: yes ?  ?HYPERTENSION / HYPERLIPIDEMIA ?Taking ASA and Metoprolol + Lipitor. Saw cardiology 11/04/20 -- returns yearly. Has history of cardiac cath with stent to proximal RCA in April 2019.  Continues on Valtessa for hyperkalemia --- recent K+ 4.8, work-up for past elevations showed WNL labs.   ? ?Never has used NTG.  History of aortic atherosclerosis noted on past imaging.   ?Satisfied with current treatment? yes ?Duration of hypertension: chronic ?BP monitoring frequency: not checking ?BP range: not checking ?BP medication side effects: no ?Duration of hyperlipidemia: chronic ?Cholesterol medication side effects: no ?Cholesterol supplements:  none ?Medication compliance: good compliance ?Aspirin: yes ?Recent stressors: no ?Recurrent headaches: no ?Visual changes: no ?Palpitations: no ?Dyspnea: no ?Chest pain: no ?Lower extremity edema: no ?Dizzy/lightheaded: no ? ?Relevant past medical, surgical, family and social history reviewed and updated as indicated. Interim medical history since our last visit reviewed. ?Allergies and medications reviewed and updated. ? ?Review of Systems  ?Constitutional:  Negative for activity change, diaphoresis, fatigue and fever.  ?Respiratory:  Negative for cough, chest tightness, shortness of breath and wheezing.   ?Cardiovascular:  Negative for chest pain, palpitations and leg swelling.  ?Gastrointestinal: Negative.   ?Endocrine: Negative for polydipsia, polyphagia and polyuria.  ?Neurological: Negative.   ?Psychiatric/Behavioral: Negative.    ? ?Per HPI unless specifically indicated above ? ?   ?Objective:  ?  ?BP 110/66   Pulse 70   Temp 97.8 ?F (36.6 ?C) (Oral)   Ht 6' 0.5" (1.842 m)   Wt 171 lb 6.4 oz (77.7 kg)   SpO2 96%   BMI 22.93 kg/m?   ?Wt Readings from Last 3 Encounters:  ?08/24/21 171 lb 6.4 oz (77.7  kg)  ?05/26/21 172 lb 3.2 oz (78.1 kg)  ?02/23/21 167 lb 12.8 oz (76.1 kg)  ?  ?Physical Exam ?Vitals and nursing note reviewed.  ?Constitutional:   ?   General: He is awake. He is not in acute distress. ?   Appearance: He is well-developed and well-groomed. He is not ill-appearing.  ?HENT:  ?   Head: Normocephalic and atraumatic.  ?   Right Ear: Hearing normal. No drainage.  ?   Left Ear: Hearing normal. No drainage.  ?Eyes:  ?   General: Lids are normal.     ?   Right eye: No discharge.     ?   Left eye: No discharge.  ?   Conjunctiva/sclera: Conjunctivae normal.  ?   Pupils: Pupils are equal, round, and reactive to light.  ?Neck:  ?   Vascular: No carotid bruit.  ?Cardiovascular:  ?   Rate and Rhythm: Normal rate and regular rhythm.  ?   Heart sounds: Normal heart sounds, S1 normal and S2 normal. No murmur  heard. ?  No gallop.  ?Pulmonary:  ?   Effort: Pulmonary effort is normal. No accessory muscle usage or respiratory distress.  ?   Breath sounds: Normal breath sounds.  ?Abdominal:  ?   General: Bowel sounds are normal.  ?   Palpations: Abdomen is soft.  ?Musculoskeletal:     ?   General: Normal range of motion.  ?   Cervical back: Normal range of motion and neck supple.  ?   Right lower leg: No edema.  ?   Left lower leg: No edema.  ?Skin: ?   General: Skin is warm and dry.  ?Neurological:  ?   Mental Status: He is alert and oriented to person, place, and time.  ?Psychiatric:     ?   Attention and Perception: Attention normal.     ?   Mood and Affect: Mood normal.     ?   Speech: Speech normal.     ?   Behavior: Behavior normal. Behavior is cooperative.     ?   Thought Content: Thought content normal.  ? ?Results for orders placed or performed in visit on 08/24/21  ?Bayer DCA Hb A1c Waived  ?Result Value Ref Range  ? HB A1C (BAYER DCA - WAIVED) 6.0 (H) 4.8 - 5.6 %  ? ?   ?Assessment & Plan:  ? ?Problem List Items Addressed This Visit   ? ?  ? Cardiovascular and Mediastinum  ? Aortic atherosclerosis ([place])  ?  Noted on past imaging, continue statin and ASA daily for prevention. ? ?  ?  ? Relevant Orders  ? Basic metabolic panel  ? 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  Chronic, stable with no recent NTG use.  Continue current medication regimen and collaboration with cardiology. ? ?  ?  ? Relevant Orders  ? Basic metabolic panel  ? Hypertension associated with diabetes ([place])  ?  Chronic, ongoing.  BP at goal today in office.  Continue current medication regimen and collaboration with cardiology.  Adjust as needed.  Recommend he continue to monitor BP occasionally at home and document + focus on DASH diet.  LABS: BMP today.  Return in 3 months.  ? ?  ?  ? Relevant Orders  ? Basic metabolic panel  ? Bayer DCA Hb A1c Waived (Completed)  ?  ? Endocrine  ? Hyperlipidemia  associated with type 2 diabetes mellitus (  HCC)  ?  Chronic, ongoing.  Continue current medication regimen and adjust as needed.  Current LDL well controlled.  Lipid panel up to date. ? ?  ?  ? Relevant Orders  ? Bayer DCA Hb A1c Waived (Completed)  ? 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  Chronic, ongoing with A1C continuing at goal 6%.  Urine ALB 10 (January 2023) -- can not take ACE or ARB due to elevation in K+ with these.  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  Continue to monitor BS at home and a post prandial check. Return in 3 months. ? ?  ?  ? Relevant Orders  ? Basic metabolic panel  ? Bayer DCA Hb A1c Waived (Completed)  ?  ? Other  ? Hyperkalemia  ?  Noted to be trending down with addition of Veltassa, last level 4.8.  Continue current medication regimen -- full work-up performed in April 2022 and all WNL.  Consider referral to nephrology or endocrinology as needed.  Discussed with patient. ? ?  ?  ? Relevant Orders  ? Basic metabolic panel  ?  ? ?Follow up plan: ?Return in about 3 months (around 11/23/2021) for T2DM, HTN/HLD, CAD.  ?

## 2021-08-24 NOTE — Assessment & Plan Note (Signed)
Chronic, ongoing with A1C continuing at goal 6%.  Urine ALB 10 (January 2023) -- can not take ACE or ARB due to elevation in K+ with these.  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  Continue to monitor BS at home and a post prandial check. Return in 3 months. ?

## 2021-08-24 NOTE — Assessment & Plan Note (Signed)
Chronic, ongoing.  BP at goal today in office.  Continue current medication regimen and collaboration with cardiology.  Adjust as needed.  Recommend he continue to monitor BP occasionally at home and document + focus on DASH diet.  LABS: BMP today.  Return in 3 months.  ?

## 2021-08-24 NOTE — Assessment & Plan Note (Signed)
Noted to be trending down with addition of Veltassa, last level 4.8.  Continue current medication regimen -- full work-up performed in April 2022 and all WNL.  Consider referral to nephrology or endocrinology as needed.  Discussed with patient. ?

## 2021-08-24 NOTE — Assessment & Plan Note (Signed)
Chronic, ongoing.  Continue current medication regimen and adjust as needed.  Current LDL well controlled.  Lipid panel up to date. ?

## 2021-08-24 NOTE — Assessment & Plan Note (Signed)
Chronic, stable with no recent NTG use.  Continue current medication regimen and collaboration with cardiology. 

## 2021-08-24 NOTE — Assessment & Plan Note (Signed)
Noted on past imaging, continue statin and ASA daily for prevention. ?

## 2021-08-25 LAB — BASIC METABOLIC PANEL
BUN/Creatinine Ratio: 17 (ref 10–24)
BUN: 18 mg/dL (ref 8–27)
CO2: 26 mmol/L (ref 20–29)
Calcium: 9.5 mg/dL (ref 8.6–10.2)
Chloride: 100 mmol/L (ref 96–106)
Creatinine, Ser: 1.08 mg/dL (ref 0.76–1.27)
Glucose: 79 mg/dL (ref 70–99)
Potassium: 5.1 mmol/L (ref 3.5–5.2)
Sodium: 142 mmol/L (ref 134–144)
eGFR: 72 mL/min/{1.73_m2} (ref >59)

## 2021-08-25 NOTE — Progress Notes (Signed)
Contacted via [place] ? ? ?Good morning Johnny Wolfe, I come bearing good news -- your potassium level is in normal range today.  Upper normal, but normal.  Continue current medications and diet focus for this.  Kidneys continue to looked fantastic too, creatinine and eGFR.  Any questions? ?Keep being amazing!!  Thank you for allowing me to participate in your care.  I appreciate you. ?Kindest regards, ?Davionne Dowty ?

## 2021-09-20 ENCOUNTER — Other Ambulatory Visit: Payer: Self-pay | Admitting: Nurse Practitioner

## 2021-09-21 NOTE — Telephone Encounter (Signed)
Requested medication (s) are due for refill today: yes  Requested medication (s) are on the active medication list: yes    Last refill: 02/23/21  #30  5 refills  Future visit scheduled   yes 11/24/21  Notes to clinic:Off protocol, please review. Thank you  Requested Prescriptions  Pending Prescriptions Disp Refills   VELTASSA 8.4 g packet [Pharmacy Med Name: VELTASSA 8.4 GM PACK] 30 each 5    Sig: TAKE THE CONTENTS OF 1 PACKET DAILY AS DIRECTED     Off-Protocol Failed - 09/20/2021 10:24 AM      Failed - Medication not assigned to a protocol, review manually.      Passed - Valid encounter within last 12 months    Recent Outpatient Visits           4 weeks ago Type 2 diabetes mellitus with hyperglycemia, with long-term current use of insulin ([place])   [place], [place] T, NP   3 months ago Type 2 diabetes mellitus with hyperglycemia, with long-term current use of insulin ([place])   [place], Jolene T, NP   7 months ago Type 2 diabetes mellitus with hyperglycemia, with long-term current use of insulin ([place])   [place], Jolene T, NP   10 months ago Type 2 diabetes mellitus with hyperglycemia, with long-term current use of insulin ([place])   [place], [place] T, NP   1 year ago Type 2 diabetes mellitus with hyperglycemia, with long-term current use of insulin ([place])   [place], Barbaraann Faster, NP       Future Appointments             In 2 months [place], Barbaraann Faster, NP [employer]

## 2021-10-23 ENCOUNTER — Other Ambulatory Visit: Payer: Self-pay | Admitting: Nurse Practitioner

## 2021-11-09 DIAGNOSIS — C4442 Squamous cell carcinoma of skin of scalp and neck: Secondary | ICD-10-CM | POA: Diagnosis not present

## 2021-11-09 DIAGNOSIS — D485 Neoplasm of uncertain behavior of skin: Secondary | ICD-10-CM | POA: Diagnosis not present

## 2021-11-19 NOTE — Patient Instructions (Signed)

## 2021-11-24 ENCOUNTER — Encounter: Payer: Self-pay | Admitting: Nurse Practitioner

## 2021-11-24 ENCOUNTER — Ambulatory Visit (INDEPENDENT_AMBULATORY_CARE_PROVIDER_SITE_OTHER): Payer: Medicare HMO | Admitting: Nurse Practitioner

## 2021-11-24 VITALS — BP 113/68 | HR 65 | Temp 97.5°F | Ht 72.5 in | Wt 170.6 lb

## 2021-11-24 DIAGNOSIS — I700 Atherosclerosis of aorta: Secondary | ICD-10-CM

## 2021-11-24 DIAGNOSIS — E875 Hyperkalemia: Secondary | ICD-10-CM

## 2021-11-24 DIAGNOSIS — E1159 Type 2 diabetes mellitus with other circulatory complications: Secondary | ICD-10-CM | POA: Diagnosis not present

## 2021-11-24 DIAGNOSIS — I152 Hypertension secondary to endocrine disorders: Secondary | ICD-10-CM | POA: Diagnosis not present

## 2021-11-24 DIAGNOSIS — E1169 Type 2 diabetes mellitus with other specified complication: Secondary | ICD-10-CM

## 2021-11-24 DIAGNOSIS — I25119 Atherosclerotic heart disease of native coronary artery with unspecified angina pectoris: Secondary | ICD-10-CM

## 2021-11-24 DIAGNOSIS — E1165 Type 2 diabetes mellitus with hyperglycemia: Secondary | ICD-10-CM | POA: Diagnosis not present

## 2021-11-24 DIAGNOSIS — E785 Hyperlipidemia, unspecified: Secondary | ICD-10-CM | POA: Diagnosis not present

## 2021-11-24 DIAGNOSIS — Z794 Long term (current) use of insulin: Secondary | ICD-10-CM

## 2021-11-24 LAB — BAYER DCA HB A1C WAIVED: HB A1C (BAYER DCA - WAIVED): 6.6 % — ABNORMAL HIGH (ref 4.8–5.6)

## 2021-11-24 NOTE — Progress Notes (Signed)
BP 113/68   Pulse 65   Temp (!) 97.5 F (36.4 C) (Oral)   Ht 6' 0.5" (1.842 m)   Wt 170 lb 9.6 oz (77.4 kg)   SpO2 92%   BMI 22.82 kg/m    Subjective:    Patient ID: Johnny Wolfe, male    DOB: 10/04/1945, 77 y.o.   MRN: [id number]  HPI: Johnny Wolfe is a 76 y.o. male  Chief Complaint  Patient presents with   Diabetes   Hyperlipidemia   Hypertension   Coronary Artery Disease   DIABETES A1c 6% April.  Continues on Metformin 1000 MG BID and Lantus 14 units.    In past tried GLP1 and SGLT, but this was not beneficial -- he lost weight with changes in medications and felt tired. Glutamic acid <5.0 and C peptide 2.1 in 2021 on check -- ?Type 1 1/2 vs Type 2. Hypoglycemic episodes: none Polydipsia/polyuria: no Visual disturbance: no Chest pain: no Paresthesias: no Glucose Monitoring: yes             Accucheck frequency: Daily             Fasting glucose: on average 100 in the morning, 71 this morning this is as low as it has been             Post prandial:              Evening:             Before meals: Taking Insulin?: no             Long acting insulin: Lantus 14 units             Short acting insulin: Blood Pressure Monitoring: daily Retinal Examination: Up to Date - [facility] Foot Exam: Up to Date Pneumovax: Up to Date Influenza: Up to Date Aspirin: yes   HYPERTENSION / HYPERLIPIDEMIA Taking ASA and Metoprolol + Lipitor. Sees cardiology with last visit on 11/04/20 -- returns yearly. Has history of cardiac cath with stent to proximal RCA in April 2019.  Continues on Valtessa for hyperkalemia --- recent K+ 5.1, work-up for past elevations showed WNL labs.  Never has used NTG.   Satisfied with current treatment? yes Duration of hypertension: chronic BP monitoring frequency: not checking BP range: not checking BP medication side effects: no Duration of hyperlipidemia: chronic Cholesterol medication side effects: no Cholesterol supplements: none Medication  compliance: good compliance Aspirin: yes Recent stressors: no Recurrent headaches: no Visual changes: no Palpitations: no Dyspnea: no Chest pain: no Lower extremity edema: no Dizzy/lightheaded: no  Relevant past medical, surgical, family and social history reviewed and updated as indicated. Interim medical history since our last visit reviewed. Allergies and medications reviewed and updated.  Review of Systems  Constitutional:  Negative for activity change, diaphoresis, fatigue and fever.  Respiratory:  Negative for cough, chest tightness, shortness of breath and wheezing.   Cardiovascular:  Negative for chest pain, palpitations and leg swelling.  Gastrointestinal: Negative.   Endocrine: Negative for polydipsia, polyphagia and polyuria.  Neurological: Negative.   Psychiatric/Behavioral: Negative.      Per HPI unless specifically indicated above     Objective:    BP 113/68   Pulse 65   Temp (!) 97.5 F (36.4 C) (Oral)   Ht 6' 0.5" (1.842 m)   Wt 170 lb 9.6 oz (77.4 kg)   SpO2 92%   BMI 22.82 kg/m   Wt Readings from Last 3 Encounters:  11/24/21  170 lb 9.6 oz (77.4 kg)  08/24/21 171 lb 6.4 oz (77.7 kg)  05/26/21 172 lb 3.2 oz (78.1 kg)    Physical Exam Vitals and nursing note reviewed.  Constitutional:      General: He is awake. He is not in acute distress.    Appearance: He is well-developed and well-groomed. He is not ill-appearing.  HENT:     Head: Normocephalic and atraumatic.     Right Ear: Hearing normal. No drainage.     Left Ear: Hearing normal. No drainage.  Eyes:     General: Lids are normal.        Right eye: No discharge.        Left eye: No discharge.     Conjunctiva/sclera: Conjunctivae normal.     Pupils: Pupils are equal, round, and reactive to light.  Neck:     Vascular: No carotid bruit.  Cardiovascular:     Rate and Rhythm: Normal rate and regular rhythm.     Heart sounds: Normal heart sounds, S1 normal and S2 normal. No murmur heard.     No gallop.  Pulmonary:     Effort: Pulmonary effort is normal. No accessory muscle usage or respiratory distress.     Breath sounds: Normal breath sounds.  Abdominal:     General: Bowel sounds are normal.     Palpations: Abdomen is soft.  Musculoskeletal:        General: Normal range of motion.     Cervical back: Normal range of motion and neck supple.     Right lower leg: No edema.     Left lower leg: No edema.  Skin:    General: Skin is warm and dry.  Neurological:     Mental Status: He is alert and oriented to person, place, and time.  Psychiatric:        Attention and Perception: Attention normal.        Mood and Affect: Mood normal.        Speech: Speech normal.        Behavior: Behavior normal. Behavior is cooperative.        Thought Content: Thought content normal.    Diabetic Foot Exam - Simple   Simple Foot Form Visual Inspection No deformities, no ulcerations, no other skin breakdown bilaterally: Yes Sensation Testing Intact to touch and monofilament testing bilaterally: Yes Pulse Check Posterior Tibialis and Dorsalis pulse intact bilaterally: Yes Comments     Results for orders placed or performed in visit on 11/24/21  Bayer DCA Hb A1c Waived  Result Value Ref Range   HB A1C (BAYER DCA - WAIVED) 6.6 (H) 4.8 - 5.6 %      Assessment & Plan:   Problem List Items Addressed This Visit       Cardiovascular and Mediastinum   Coronary artery disease involving native coronary artery with angina pectoris (HCC)    Chronic, stable with no recent NTG use.  Continue current medication regimen and collaboration with cardiology.      Hypertension associated with diabetes ([place])    Chronic, stable with BP at goal today in office.  Continue current medication regimen and collaboration with cardiology.  Adjust as needed.  Recommend he continue to monitor BP occasionally at home and document + focus on DASH diet.  Potassium check today.  Return in 3 months.       Relevant  Orders   Bayer DCA Hb A1c Waived (Completed)     Endocrine   Hyperlipidemia  associated with type 2 diabetes mellitus (HCC)    Chronic, stable.  Continue current medication regimen and adjust as needed.  Current LDL well controlled.  Lipid panel today.      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  Urine ALB 10 (January 2023).  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  Continue to monitor BS at home and a post prandial check.  Return in 3 months.      Relevant Orders   Bayer DCA Hb A1c Waived (Completed)     Other   Hyperkalemia    Noted to be trending down with addition of Veltassa, last level 5.1.  Continue current medication regimen -- full work-up performed in April 2022 and all WNL.  Consider referral to nephrology or endocrinology as needed.  Discussed with patient.      Relevant Orders   Potassium     Follow up plan: Return in about 3 months (around 02/24/2022) for Annual physical.

## 2021-11-24 NOTE — Assessment & Plan Note (Signed)
Chronic, stable with BP at goal today in office.  Continue current medication regimen and collaboration with cardiology.  Adjust as needed.  Recommend he continue to monitor BP occasionally at home and document + focus on DASH diet.  Potassium check today.  Return in 3 months.

## 2021-11-24 NOTE — Assessment & Plan Note (Signed)
Noted to be trending down with addition of Veltassa, last level 5.1.  Continue current medication regimen -- full work-up performed in April 2022 and all WNL.  Consider referral to nephrology or endocrinology as needed.  Discussed with patient.

## 2021-11-24 NOTE — Assessment & Plan Note (Signed)
Chronic, stable with A1c remaining stable 6.6%, mild trend up.  Urine ALB 10 (January 2023).  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  Continue to monitor BS at home and a post prandial check.  Return in 3 months.

## 2021-11-24 NOTE — Assessment & Plan Note (Signed)
Chronic, stable with no recent NTG use.  Continue current medication regimen and collaboration with cardiology. 

## 2021-11-24 NOTE — Assessment & Plan Note (Signed)
Chronic, stable.  Continue current medication regimen and adjust as needed.  Current LDL well controlled.  Lipid panel today. 

## 2021-11-25 LAB — LIPID PANEL W/O CHOL/HDL RATIO
Cholesterol, Total: 125 mg/dL (ref 100–199)
HDL: 75 mg/dL (ref >39)
LDL Chol Calc (NIH): 36 mg/dL (ref 0–99)
Triglycerides: 69 mg/dL (ref 0–149)
VLDL Cholesterol Cal: 14 mg/dL (ref 5–40)

## 2021-11-25 LAB — POTASSIUM: Potassium: 4.7 mmol/L (ref 3.5–5.2)

## 2021-11-25 NOTE — Progress Notes (Signed)
Contacted via MyChart   Good morning Johnny Wolfe, your labs have returned and all remains stable.  Potassium continues to be in normal range with medication on board, I would continue this and continue diet focus as well.  Any questions? Keep being amazing!!  Thank you for allowing me to participate in your care.  I appreciate you. Kindest regards, Malon [place]

## 2021-12-21 DIAGNOSIS — D044 Carcinoma in situ of skin of scalp and neck: Secondary | ICD-10-CM | POA: Diagnosis not present

## 2021-12-21 DIAGNOSIS — C4442 Squamous cell carcinoma of skin of scalp and neck: Secondary | ICD-10-CM | POA: Diagnosis not present

## 2022-01-25 ENCOUNTER — Other Ambulatory Visit: Payer: Self-pay | Admitting: Nurse Practitioner

## 2022-01-25 NOTE — Telephone Encounter (Signed)
Requested medication (s) are due for refill today:yes  Requested medication (s) are on the active medication list: yes  Last refill:  02/23/21  Future visit scheduled: yes  Notes to clinic:  Unable to refill per protocol, last refill by another provider.  Historical provider, routing for review.     Requested Prescriptions  Pending Prescriptions Disp Refills   Lancets (ONETOUCH DELICA PLUS [id number]) [place] [Pharmacy Med Name: ONETOUCH DELICA PLUS [id number]] 100 each     Sig: USE TO [place] BLOOD SUGAR 2-3 TIMES DAILY     Endocrinology: Diabetes - Testing Supplies Passed - 01/25/2022 11:44 AM      Passed - Valid encounter within last 12 months    Recent Outpatient Visits           2 months ago Type 2 diabetes mellitus with hyperglycemia, with long-term current use of insulin ([place])   [place], Jolene T, NP   5 months ago Type 2 diabetes mellitus with hyperglycemia, with long-term current use of insulin ([place])   [place], Jolene T, NP   8 months ago Type 2 diabetes mellitus with hyperglycemia, with long-term current use of insulin ([place])   [place], Jolene T, NP   11 months ago Type 2 diabetes mellitus with hyperglycemia, with long-term current use of insulin ([place])   [place], [place] T, NP   1 year ago Type 2 diabetes mellitus with hyperglycemia, with long-term current use of insulin ([place])   [place], Barbaraann Faster, NP       Future Appointments             In 1 week Gollan, Kathlene November, MD [place]. [place]   In 1 month Cannady, [place] T, NP [facility]            Signed Prescriptions Disp Refills   atorvastatin (LIPITOR) 40 MG tablet 90 tablet 0    Sig: TAKE 1 TABLET BY MOUTH DAILY. DUE FOR FOLLOW UP IN JULY     Cardiovascular:  Antilipid - Statins Failed - 01/25/2022 11:44 AM      Failed - Lipid  Panel in normal range within the last 12 months    Cholesterol, Total  Date Value Ref Range Status  11/24/2021 125 100 - 199 mg/dL Final   Cholesterol Piccolo, Waived  Date Value Ref Range Status  05/14/2019 126 <200 mg/dL Final    Comment:                            Desirable                <200                         Borderline High      200- 239                         High                     >239    LDL Chol Calc (NIH)  Date Value Ref Range Status  11/24/2021 36 0 - 99 mg/dL Final   HDL  Date Value Ref Range Status  11/24/2021 75 >39 mg/dL Final   Triglycerides  Date Value Ref Range Status  11/24/2021 69 0 - 149 mg/dL Final   Triglycerides Piccolo,Waived  Date Value Ref Range Status  05/14/2019 113 <150 mg/dL Final    Comment:                            Normal                   <150                         Borderline High     150 - 199                         High                200 - 499                         Very High                >499          Passed - Patient is not pregnant      Passed - Valid encounter within last 12 months    Recent Outpatient Visits           2 months ago Type 2 diabetes mellitus with hyperglycemia, with long-term current use of insulin ([place])   [place], Jolene T, NP   5 months ago Type 2 diabetes mellitus with hyperglycemia, with long-term current use of insulin ([place])   [place], Jolene T, NP   8 months ago Type 2 diabetes mellitus with hyperglycemia, with long-term current use of insulin ([place])   [place], Jolene T, NP   11 months ago Type 2 diabetes mellitus with hyperglycemia, with long-term current use of insulin ([place])   [place], [place] T, NP   1 year ago Type 2 diabetes mellitus with hyperglycemia, with long-term current use of insulin ([place])   [place], Barbaraann Faster, NP       Future Appointments              In 1 week Gollan, Kathlene November, MD [place]. [facility]   In 1 month Cannady, Barbaraann Faster, NP [facility]

## 2022-01-25 NOTE — Telephone Encounter (Signed)
Requested Prescriptions  Pending Prescriptions Disp Refills  . atorvastatin (LIPITOR) 40 MG tablet [Pharmacy Med Name: ATORVASTATIN CALCIUM 40 MG TAB] 90 tablet 0    Sig: TAKE 1 TABLET BY MOUTH DAILY. DUE FOR FOLLOW UP IN JULY     Cardiovascular:  Antilipid - Statins Failed - 01/25/2022 11:44 AM      Failed - Lipid Panel in normal range within the last 12 months    Cholesterol, Total  Date Value Ref Range Status  11/24/2021 125 100 - 199 mg/dL Final   Cholesterol Piccolo, Waived  Date Value Ref Range Status  05/14/2019 126 <200 mg/dL Final    Comment:                            Desirable                <200                         Borderline High      200- 239                         High                     >239    LDL Chol Calc (NIH)  Date Value Ref Range Status  11/24/2021 36 0 - 99 mg/dL Final   HDL  Date Value Ref Range Status  11/24/2021 75 >39 mg/dL Final   Triglycerides  Date Value Ref Range Status  11/24/2021 69 0 - 149 mg/dL Final   Triglycerides Piccolo,Waived  Date Value Ref Range Status  05/14/2019 113 <150 mg/dL Final    Comment:                            Normal                   <150                         Borderline High     150 - 199                         High                200 - 499                         Very High                >499          Passed - Patient is not pregnant      Passed - Valid encounter within last 12 months    Recent Outpatient Visits          2 months ago Type 2 diabetes mellitus with hyperglycemia, with long-term current use of insulin ([place])   [place], Jolene T, NP   5 months ago Type 2 diabetes mellitus with hyperglycemia, with long-term current use of insulin ([place])   [place], Jolene T, NP   8 months ago Type 2 diabetes mellitus with hyperglycemia, with long-term current use of insulin ([place])   [employer], [place] T, NP   11 months ago  Type 2  diabetes mellitus with hyperglycemia, with long-term current use of insulin ([place])   [place], [place] T, NP   1 year ago Type 2 diabetes mellitus with hyperglycemia, with long-term current use of insulin ([place])   [place], Barbaraann Faster, NP      Future Appointments            In 1 week Gollan, Kathlene November, MD [place]. [place]   In 1 month Cannady, Barbaraann Faster, NP [facility]           . Lancets (ONETOUCH DELICA PLUS [id number]) [place] [Pharmacy Med Name: ONETOUCH DELICA PLUS [id number]] 100 each     Sig: USE TO CHECK BLOOD SUGAR 2-3 TIMES DAILY     Endocrinology: Diabetes - Testing Supplies Passed - 01/25/2022 11:44 AM      Passed - Valid encounter within last 12 months    Recent Outpatient Visits          2 months ago Type 2 diabetes mellitus with hyperglycemia, with long-term current use of insulin ([place])   [place], Jolene T, NP   5 months ago Type 2 diabetes mellitus with hyperglycemia, with long-term current use of insulin ([place])   [place], Jolene T, NP   8 months ago Type 2 diabetes mellitus with hyperglycemia, with long-term current use of insulin ([place])   [place], Jolene T, NP   11 months ago Type 2 diabetes mellitus with hyperglycemia, with long-term current use of insulin ([place])   [place], [place] T, NP   1 year ago Type 2 diabetes mellitus with hyperglycemia, with long-term current use of insulin ([place])   [place], Barbaraann Faster, NP      Future Appointments            In 1 week Gollan, Kathlene November, MD [place]. [facility]   In 1 month Cannady, Barbaraann Faster, NP [facility]

## 2022-02-03 NOTE — Progress Notes (Unsigned)
Cardiology Office Note  Date:  02/05/2022   ID:  Johnny [facility]fe, DOB 18-Dec-1945, MRN [id number]  PCP:  Johnny Lick, NP   Chief Complaint  Patient presents with   Other    12 month f/u no complaints today. Meds reviewed verbally with pt.    HPI:  [name] is a 76 yo old gentleman with past medical history of Diabetes,  Hyperkalemia on valtessa Hypertension Hyperlipidemia Smoker quit 2013 ETOH Coronary calcium score of 2310. Cardiac cath 08/02/2017 Severe proximal RCA disease, with stent placement Moderate proximal LAD and LCX disease Mild mid LAD, RCA disease, Vessels are heavily calcified. Who presents for follow-up of his coronary disease, stent to Johnny RCA  Last office visit with myself July 2022  Follow-up today active, denies any chest pain or shortness of breath concerning for angina  Reports blood pressure well controlled Dizzy sometimes when getting up to stand from a sitting position ,rare Active, traveling  [place] work reviewed A1c 6.6  likes to travel, Traveling to [place]  EKG personally reviewed by myself on todays visit NSR rate 64 bpm no significant ST or T wave changes  Other past medical history reviewed Echo at [facility] Johnny left ventricle is normal in size with normal wall thickness. Johnny left ventricular systolic function is normal, LVEF is visually estimated at > 55%. Johnny right ventricle is normal in size, with normal systolic function.  Recent trip to [place], some hypersensitive to light Weight back up 10 pounds, Prior visit, had lost 20 pounds Less off orthostasis Has had med changes, off jardiance/ozympic  Fall, seen in [facility] ER 05/12/2019  tripped on part of his rug, falling and striking Johnny right side of his face on Johnny floor. facial fracture requiring ORIF by Dr. Mardee [facility] with [facility] at [facility].   FAll, 06/18/2019, in [facility] ER   syncopal episode at home which resulted in an injury to his chin and jaw got up tonight and felt dizzy and is not  quite certain what happened but woke up on Johnny floor with an odd and "full" feeling in his right ear and some bleeding under his chin. Transferred to [facility] Has IVF 2 1/2 days  Cardiac cath 08/02/2017 Severe proximal RCA disease, with stent placement Moderate proximal LAD and LCX disease Mild mid LAD, RCA disease, Vessels are heavily calcified. Normal EF estimated at 55%  Coronary calcium score of 2310. This was 95 percentile for age and sex matched control. high calcium score.  severe heavy three-vessel disease  mild aortic atherosclerosis  Syncope: 05/21/2017 On [employer] to bed 7pm Woke up on Johnny floor Remembered went to bathroom, woke up on floor, dark in room Then went to bathroom again,  Feeling for light Woke up again in Johnny bathtub, little knot on head, Got up out of bathroom,  Little dizzy,  Sugars were 59  Father died 81s cancer Mother Obese, DM, cad, stent age 24, now age 6, now pacer Brother CAD, PCi  PMH:   has a past medical history of CAD (coronary artery disease), Cataract, Diabetes mellitus without complication ([place]), Essential hypertension, Hyperlipidemia, Syncope, and Wears dentures.  PSH:    Past Surgical History:  Procedure Laterality Date   APPENDECTOMY     CARDIAC CATHETERIZATION     CATARACT EXTRACTION, BILATERAL     August 2017   CORONARY STENT INTERVENTION N/A 08/02/2017   Procedure: CORONARY STENT INTERVENTION;  Surgeon: Johnny Hampshire, MD;  Location: [facility];  Service: Cardiovascular;  Laterality: N/A;   LEFT HEART CATH AND CORONARY ANGIOGRAPHY Left 08/02/2017   Procedure: LEFT HEART CATH AND CORONARY ANGIOGRAPHY;  Surgeon: Johnny Merritts, MD;  Location: [facility];  Service: Cardiovascular;  Laterality: Left;   MASS EXCISION Left 07/04/2017   Procedure: REMOVAL TWO CYST EAR EAR LOBE AND EXTERNAL EAR;  Surgeon: Beverly Gust, MD;  Location: [facility];  Service: [facility];  Laterality: Left;   LOCAL Diabetic - insulin and oral meds   MOHS SURGERY     head   upper jaw Right 06/2019   [facility]    Current Outpatient Medications  Medication Sig Dispense Refill   aspirin EC 81 MG tablet Take 81 mg by mouth daily.     atorvastatin (LIPITOR) 40 MG tablet TAKE 1 TABLET BY MOUTH DAILY. DUE FOR FOLLOW UP IN JULY 90 tablet 0   Blood Glucose Monitoring Suppl (ONETOUCH VERIO) w/Device KIT 1 Device by Does not apply route daily. 1 kit 0   glucose blood test strip OneTouch Verio test strips 100 each 12   Lancet Device MISC 1 Units by Does not apply route daily as needed. 100 each 11   Lancets (ONETOUCH DELICA PLUS [id number]) MISC USE TO CHECK BLOOD SUGAR 2-3 TIMES DAILY 100 each 4   LANTUS SOLOSTAR 100 UNIT/ML Solostar Pen INJECT SUBCUTANEOUSLY 16  UNITS DAILY 15 mL 3   magnesium oxide (MAG-OX) 400 (240 Mg) MG tablet TAKE 1 TABLET BY MOUTH TWICE DAILY 180 tablet 0   metFORMIN (GLUCOPHAGE) 1000 MG tablet TAKE 1 TABLET BY MOUTH  TWICE DAILY WITH A MEAL 180 tablet 4   metoprolol tartrate (LOPRESSOR) 25 MG tablet Take 0.5 tablets (12.5 mg total) by mouth 2 (two) times daily. 90 tablet 4   VELTASSA 8.4 g packet TAKE Johnny CONTENTS OF 1 PACKET DAILY AS DIRECTED 30 each 5   vitamin B-12 (CYANOCOBALAMIN) 1000 MCG tablet Take 500 mcg by mouth daily.      nitroGLYCERIN (NITROSTAT) 0.4 MG SL tablet Place 1 tablet (0.4 mg total) under Johnny tongue every 5 (five) minutes as needed for chest pain. 25 tablet 0   No current facility-administered medications for this visit.     Allergies:   Neosporin [bacitracin-polymyxin b], Ace inhibitors, and Neosporin [neomycin-bacitracin zn-polymyx]   Social History:  Johnny patient  reports that he quit smoking about 10 years ago. His smoking use included cigarettes. He smoked an average of 1 pack per day. He has never used smokeless tobacco. He reports current alcohol use of about 7.0 - 11.0 standard drinks of alcohol per week. He reports that he does not use drugs.    Family History:   family history includes Arrhythmia (age of onset: 56) in his mother; Cancer in his father; Diabetes in his mother; Kidney cancer in his sister; Lung cancer in his sister.    Review of Systems: Review of Systems  Constitutional: Negative.   HENT: Negative.    Respiratory: Negative.    Cardiovascular: Negative.   Gastrointestinal: Negative.   Musculoskeletal: Negative.   Neurological: Negative.   Psychiatric/Behavioral: Negative.    All other systems reviewed and are negative.   PHYSICAL EXAM: VS:  BP 120/60 (BP Location: Left Arm, Patient Position: Sitting, Cuff Size: Normal)   Pulse 66   Ht 6' (1.829 m)   Wt 169 lb (76.7 kg)   SpO2 97%   BMI 22.92 kg/m  , BMI Body mass index is 22.92 kg/m. Constitutional:  oriented to person, place, and time.  No distress.  HENT:  Head: Grossly normal Eyes:  no discharge. No scleral icterus.  Neck: No JVD, no carotid bruits  Cardiovascular: Regular rate and rhythm, no murmurs appreciated Pulmonary/Chest: Clear to auscultation bilaterally, no wheezes or rails Abdominal: Soft.  no distension.  no tenderness.  Musculoskeletal: Normal range of motion Neurological:  normal muscle tone. Coordination normal. No atrophy Skin: Skin warm and dry Psychiatric: normal affect, pleasant  Recent Labs: 02/23/2021: TSH 4.280 05/26/2021: ALT 11; Hemoglobin 13.9; Magnesium 1.6; Platelets 222 08/24/2021: BUN 18; Creatinine, Ser 1.08; Sodium 142 11/24/2021: Potassium 4.7    Lipid Panel [facility] Results  Component Value Date   CHOL 125 11/24/2021   HDL 75 11/24/2021   LDLCALC 36 11/24/2021   TRIG 69 11/24/2021    Wt Readings from Last 3 Encounters:  02/05/22 169 lb (76.7 kg)  11/24/21 170 lb 9.6 oz (77.4 kg)  08/24/21 171 lb 6.4 oz (77.7 kg)     ASSESSMENT AND PLAN:  Type 2 diabetes mellitus without complication, with long-term current use of insulin (HCC) Managed by primary care,  A1c of 6.6, in general numbers are  stable Weight stable  Syncope and collapse -  Occasional orthostasis symptoms with standing, Recommend hydration  CAD, Stable angina cardiac catheterization April 2019  Stent to Johnny proximal RCA Currently with no symptoms of angina. No further workup at this time. Continue current medication regimen.  High cholesterol -  Cholesterol is at goal on Johnny current lipid regimen. No changes to Johnny medications were made.  Hyperkalemia avoiding ACE inhibitors ARB On valtessa, potassium level stable    Total encounter time more than 30 minutes  Greater than 50% was spent in counseling and coordination of care with Johnny patient    Orders Placed This Encounter  Procedures   EKG 12-Lead      Signed, Esmond Plants, M.D., Ph.D. 02/05/2022  [place], [place]

## 2022-02-05 ENCOUNTER — Telehealth: Payer: Self-pay | Admitting: Cardiovascular Disease

## 2022-02-05 ENCOUNTER — Ambulatory Visit: Payer: Medicare HMO | Attending: Cardiovascular Disease | Admitting: Cardiovascular Disease

## 2022-02-05 ENCOUNTER — Encounter: Payer: Self-pay | Admitting: Cardiovascular Disease

## 2022-02-05 VITALS — BP 120/60 | HR 66 | Ht 72.0 in | Wt 169.0 lb

## 2022-02-05 DIAGNOSIS — I700 Atherosclerosis of aorta: Secondary | ICD-10-CM | POA: Diagnosis not present

## 2022-02-05 DIAGNOSIS — I25119 Atherosclerotic heart disease of native coronary artery with unspecified angina pectoris: Secondary | ICD-10-CM

## 2022-02-05 DIAGNOSIS — E1159 Type 2 diabetes mellitus with other circulatory complications: Secondary | ICD-10-CM | POA: Diagnosis not present

## 2022-02-05 DIAGNOSIS — E1169 Type 2 diabetes mellitus with other specified complication: Secondary | ICD-10-CM | POA: Diagnosis not present

## 2022-02-05 DIAGNOSIS — E785 Hyperlipidemia, unspecified: Secondary | ICD-10-CM

## 2022-02-05 DIAGNOSIS — I152 Hypertension secondary to endocrine disorders: Secondary | ICD-10-CM

## 2022-02-05 MED ORDER — METOPROLOL TARTRATE 25 MG PO TABS: 12.5000 mg | ORAL_TABLET | Freq: Two times a day (BID) | ORAL | 3 refills | Status: DC

## 2022-02-05 MED ORDER — NITROGLYCERIN 0.4 MG SL SUBL: 0.4000 mg | SUBLINGUAL_TABLET | SUBLINGUAL | 2 refills | Status: AC | PRN

## 2022-02-05 MED ORDER — NITROGLYCERIN 0.4 MG SL SUBL: 0.4000 mg | SUBLINGUAL_TABLET | SUBLINGUAL | 0 refills | Status: DC | PRN

## 2022-02-05 MED ORDER — ATORVASTATIN CALCIUM 40 MG PO TABS: 40.0000 mg | ORAL_TABLET | Freq: Every day | ORAL | 3 refills | Status: DC

## 2022-02-05 NOTE — Telephone Encounter (Signed)
Pt c/o medication issue:  1. Name of Medication: metoprolol tartrate (LOPRESSOR) 25 MG tablet  2. How are you currently taking this medication (dosage and times per day)? As prescribed   3. Are you having a reaction (difficulty breathing--STAT)?   4. What is your medication issue? Pharmacy is calling to get clarification on this medication. The quantity changed to 180 and they would like to make sure that patient is still to only take half tablet daily. Please advise.

## 2022-02-05 NOTE — Telephone Encounter (Signed)
Spoke with pharmacy and confirmed dosage and updated script sent. She read back instructions, quantity, and frequency.

## 2022-02-05 NOTE — Patient Instructions (Signed)
Medication Instructions:  No changes  If you need a refill on your cardiac medications before your next appointment, please call your pharmacy.   Lab work: No new labs needed  Testing/Procedures: No new testing needed  Follow-Up: At [facility], you and your health needs are our priority.  As part of our continuing mission to provide you with exceptional heart care, we have created designated Provider Care Teams.  These Care Teams include your primary Cardiologist (physician) and Advanced Practice Providers (APPs -  Physician Assistants and Nurse Practitioners) who all work together to provide you with the care you need, when you need it.  You will need a follow up appointment in 12 months  Providers on your designated Care Team:   Christopher Berge, NP Ryan Dunn, PA-C Cadence Furth, PA-C  COVID-19 Vaccine Information can be found at: [email or web address] For questions related to vaccine distribution or appointments, please email [email or web address] or call [phone].   

## 2022-02-19 DIAGNOSIS — Z859 Personal history of malignant neoplasm, unspecified: Secondary | ICD-10-CM | POA: Diagnosis not present

## 2022-02-19 DIAGNOSIS — L905 Scar conditions and fibrosis of skin: Secondary | ICD-10-CM | POA: Diagnosis not present

## 2022-02-26 ENCOUNTER — Ambulatory Visit: Payer: Medicare HMO | Admitting: Nurse Practitioner

## 2022-03-01 ENCOUNTER — Encounter: Payer: Medicare HMO | Admitting: Nurse Practitioner

## 2022-03-04 NOTE — Patient Instructions (Signed)
Diabetes Mellitus Basics  Diabetes mellitus, or diabetes, is a long-term (chronic) disease. It occurs when the body does not properly use sugar (glucose) that is released from food after you eat. Diabetes mellitus may be caused by one or both of these problems: Your pancreas does not make enough of a hormone called insulin. Your body does not react in a normal way to the insulin that it makes. Insulin lets glucose enter cells in your body. This gives you energy. If you have diabetes, glucose cannot get into cells. This causes high blood glucose (hyperglycemia). How to treat and manage diabetes You may need to take insulin or other diabetes medicines daily to keep your glucose in balance. If you are prescribed insulin, you will learn how to give yourself insulin by injection. You may need to adjust the amount of insulin you take based on the foods that you eat. You will need to check your blood glucose levels using a glucose monitor as told by your health care provider. The readings can help determine if you have low or high blood glucose. Generally, you should have these blood glucose levels: Before meals (preprandial): 80-130 mg/dL (4.4-7.2 mmol/L). After meals (postprandial): below 180 mg/dL (10 mmol/L). Hemoglobin A1c (HbA1c) level: less than 7%. Your health care provider will set treatment goals for you. Keep all follow-up visits. This is important. Follow these instructions at home: Diabetes medicines Take your diabetes medicines every day as told by your health care provider. List your diabetes medicines here: Name of medicine: ______________________________ Amount (dose): _______________ Time (a.m./p.m.): _______________ Notes: ___________________________________ Name of medicine: ______________________________ Amount (dose): _______________ Time (a.m./p.m.): _______________ Notes: ___________________________________ Name of medicine: ______________________________ Amount (dose):  _______________ Time (a.m./p.m.): _______________ Notes: ___________________________________ Insulin If you use insulin, list the types of insulin you use here: Insulin type: ______________________________ Amount (dose): _______________ Time (a.m./p.m.): _______________Notes: ___________________________________ Insulin type: ______________________________ Amount (dose): _______________ Time (a.m./p.m.): _______________ Notes: ___________________________________ Insulin type: ______________________________ Amount (dose): _______________ Time (a.m./p.m.): _______________ Notes: ___________________________________ Insulin type: ______________________________ Amount (dose): _______________ Time (a.m./p.m.): _______________ Notes: ___________________________________ Insulin type: ______________________________ Amount (dose): _______________ Time (a.m./p.m.): _______________ Notes: ___________________________________ Managing blood glucose  Check your blood glucose levels using a glucose monitor as told by your health care provider. Write down the times that you check your glucose levels here: Time: _______________ Notes: ___________________________________ Time: _______________ Notes: ___________________________________ Time: _______________ Notes: ___________________________________ Time: _______________ Notes: ___________________________________ Time: _______________ Notes: ___________________________________ Time: _______________ Notes: ___________________________________  Low blood glucose Low blood glucose (hypoglycemia) is when glucose is at or below 70 mg/dL (3.9 mmol/L). Symptoms may include: Feeling: Hungry. Sweaty and clammy. Irritable or easily upset. Dizzy. Sleepy. Having: A fast heartbeat. A headache. A change in your vision. Numbness around the mouth, lips, or tongue. Having trouble with: Moving (coordination). Sleeping. Treating low blood glucose To treat low blood  glucose, eat or drink something containing sugar right away. If you can think clearly and swallow safely, follow the 15:15 rule: Take 15 grams of a fast-acting carb (carbohydrate), as told by your health care provider. Some fast-acting carbs are: Glucose tablets: take 3-4 tablets. Hard candy: eat 3-5 pieces. Fruit juice: drink 4 oz (120 mL). Regular (not diet) soda: drink 4-6 oz (120-180 mL). Honey or sugar: eat 1 Tbsp (15 mL). Check your blood glucose levels 15 minutes after you take the carb. If your glucose is still at or below 70 mg/dL (3.9 mmol/L), take 15 grams of a carb again. If your glucose does not go above 70 mg/dL (3.9 mmol/L) after   3 tries, get help right away. After your glucose goes back to normal, eat a meal or a snack within 1 hour. Treating very low blood glucose If your glucose is at or below 54 mg/dL (3 mmol/L), you have very low blood glucose (severe hypoglycemia). This is an emergency. Do not wait to see if the symptoms will go away. Get medical help right away. Call your local emergency services (911 in the [place] Do not drive yourself to the hospital. Questions to ask your health care provider Should I talk with a diabetes educator? What equipment will I need to care for myself at home? What diabetes medicines do I need? When should I take them? How often do I need to check my blood glucose levels? What number can I call if I have questions? When is my follow-up visit? Where can I find a support group for people with diabetes? Where to find more information American Diabetes Association: www.diabetes.org Association of Diabetes [employer] and Education Specialists: www.diabeteseducator.org Contact a health care provider if: Your blood glucose is at or above 240 mg/dL (13.3 mmol/L) for 2 days in a row. You have been sick or have had a fever for 2 days or more, and you are not getting better. You have any of these problems for more than 6 hours: You cannot eat or  drink. You feel nauseous. You vomit. You have diarrhea. Get help right away if: Your blood glucose is lower than 54 mg/dL (3 mmol/L). You get confused. You have trouble thinking clearly. You have trouble breathing. These symptoms may represent a serious problem that is an emergency. Do not wait to see if the symptoms will go away. Get medical help right away. Call your local emergency services (911 in the [place] Do not drive yourself to the hospital. Summary Diabetes mellitus is a chronic disease that occurs when the body does not properly use sugar (glucose) that is released from food after you eat. Take insulin and diabetes medicines as told. Check your blood glucose every day, as often as told. Keep all follow-up visits. This is important. This information is not intended to replace advice given to you by your health care provider. Make sure you discuss any questions you have with your health care provider. Document Revised: 08/18/2019 Document Reviewed: 08/18/2019 [employer] Patient Education  2023 [employer]  

## 2022-03-07 ENCOUNTER — Encounter: Payer: Self-pay | Admitting: Nurse Practitioner

## 2022-03-07 ENCOUNTER — Ambulatory Visit (INDEPENDENT_AMBULATORY_CARE_PROVIDER_SITE_OTHER): Payer: Medicare HMO | Admitting: Nurse Practitioner

## 2022-03-07 VITALS — BP 97/63 | HR 74 | Temp 97.6°F | Ht 72.5 in | Wt 169.6 lb

## 2022-03-07 DIAGNOSIS — E1159 Type 2 diabetes mellitus with other circulatory complications: Secondary | ICD-10-CM

## 2022-03-07 DIAGNOSIS — Z794 Long term (current) use of insulin: Secondary | ICD-10-CM

## 2022-03-07 DIAGNOSIS — E1165 Type 2 diabetes mellitus with hyperglycemia: Secondary | ICD-10-CM | POA: Diagnosis not present

## 2022-03-07 DIAGNOSIS — Z0000 Encounter for general adult medical examination without abnormal findings: Secondary | ICD-10-CM | POA: Diagnosis not present

## 2022-03-07 DIAGNOSIS — I152 Hypertension secondary to endocrine disorders: Secondary | ICD-10-CM | POA: Diagnosis not present

## 2022-03-07 DIAGNOSIS — E875 Hyperkalemia: Secondary | ICD-10-CM

## 2022-03-07 DIAGNOSIS — I25119 Atherosclerotic heart disease of native coronary artery with unspecified angina pectoris: Secondary | ICD-10-CM | POA: Diagnosis not present

## 2022-03-07 DIAGNOSIS — N400 Enlarged prostate without lower urinary tract symptoms: Secondary | ICD-10-CM | POA: Diagnosis not present

## 2022-03-07 DIAGNOSIS — I700 Atherosclerosis of aorta: Secondary | ICD-10-CM | POA: Diagnosis not present

## 2022-03-07 DIAGNOSIS — E785 Hyperlipidemia, unspecified: Secondary | ICD-10-CM | POA: Diagnosis not present

## 2022-03-07 DIAGNOSIS — E1169 Type 2 diabetes mellitus with other specified complication: Secondary | ICD-10-CM | POA: Diagnosis not present

## 2022-03-07 DIAGNOSIS — Z23 Encounter for immunization: Secondary | ICD-10-CM

## 2022-03-07 LAB — BAYER DCA HB A1C WAIVED: HB A1C (BAYER DCA - WAIVED): 6.9 % — ABNORMAL HIGH (ref 4.8–5.6)

## 2022-03-07 NOTE — Assessment & Plan Note (Signed)
Chronic, stable.  Noted to be trending down with addition of V[place].  Continue current medication regimen -- full work-up performed in April 2022 and all WNL.  Consider referral to nephrology or endocrinology as needed.  Discussed with patient.

## 2022-03-07 NOTE — Assessment & Plan Note (Signed)
Chronic, stable.  Continue current medication regimen and adjust as needed.  Current LDL well controlled.  Lipid panel today.

## 2022-03-07 NOTE — Assessment & Plan Note (Signed)
Chronic, stable.  BP on lower side today, at home he reports normal readings.  Continue current medication regimen and collaboration with cardiology.  Adjust as needed.  Recommend he continue to monitor BP occasionally at home and document + focus on DASH diet.  LABS: CBC, CMP, TSH.  Return in 3 months.

## 2022-03-07 NOTE — Progress Notes (Signed)
BP 97/63   Pulse 74   Temp 97.6 F (36.4 C) (Oral)   Ht 6' 0.5" (1.842 m)   Wt 169 lb 9.6 oz (76.9 kg)   SpO2 96%   BMI 22.69 kg/m    Subjective:    Patient ID: Johnny Wolfe, male    DOB: Sep 14, 1945, 76 y.o.   MRN: [id number]  HPI: Johnny Wolfe is a 76 y.o. male presenting on 03/07/2022 for comprehensive medical examination. Current medical complaints include:none  He currently lives with: wife Interim Problems from his last visit: no  Is scheduled to have right hand surgery for Dupuytren's contracture on Monday with Dr. Greta Doom in [place].  DIABETES Last A1c 6.6% in July.  Continues on Metformin 1000 MG BID and Lantus 14 units.  Have tried GLP1 and SGLT, but these were not beneficial -- he lost weight and felt increased fatigue with these medications.    Glutamic acid <5.0 and C peptide 2.1 in 2021 on check -- ?Type 1 1/2 vs Type 2. Hypoglycemic episodes: x 1 below 70, no further Polydipsia/polyuria: no Visual disturbance: no Chest pain: no Paresthesias: no Glucose Monitoring: yes             Accucheck frequency: Daily             Fasting glucose: 100 range, occasional above (one 150 in morning)             Post prandial:              Evening:             Before meals: Taking Insulin?: no             Long acting insulin: Lantus 14 units             Short acting insulin: Blood Pressure Monitoring: daily Retinal Examination: Up to Date Foot Exam: Up to Date Pneumovax: Up to Date Influenza: Up to Date Aspirin: yes   HYPERTENSION / HYPERLIPIDEMIA Continues on ASA and Metoprolol + Lipitor. Last saw cardiology 02/05/22, he has history of cardiac cath with stent to proximal RCA in April 2019 -- no changes recent visit.  Continues on Veltassa for hyperkalemia --- work-up showed WNL labs and recent K+ stable.    Has never used NTG.  Has history of aortic atherosclerosis noted on past imaging.   Satisfied with current treatment? yes Duration of hypertension: chronic BP  monitoring frequency: not checking BP range: not checking BP medication side effects: no Duration of hyperlipidemia: chronic Cholesterol medication side effects: no Cholesterol supplements: none Medication compliance: good compliance Aspirin: yes Recent stressors: no Recurrent headaches: no Visual changes: no Palpitations: no Dyspnea: no Chest pain: no Lower extremity edema: no Dizzy/lightheaded: no The ASCVD Risk score (Arnett DK, et al., 2019) failed to calculate for the following reasons:   The valid total cholesterol range is 130 to 320 mg/dL  Functional Status Survey: Is the patient deaf or have difficulty hearing?: No Does the patient have difficulty seeing, even when wearing glasses/contacts?: No Does the patient have difficulty concentrating, remembering, or making decisions?: No Does the patient have difficulty walking or climbing stairs?: No Does the patient have difficulty dressing or bathing?: No Does the patient have difficulty doing errands alone such as visiting a doctor's office or shopping?: No  FALL RISK:    03/07/2022    2:10 PM 11/24/2021    8:57 AM 08/24/2021    9:13 AM 03/13/2021   12:17 PM 02/23/2021  8:20 AM  Fall Risk   Falls in the past year? 0 0 0 0 0  Number falls in past yr: 0 0 0 0 0  Injury with Fall? 0 0 0 0 0  Risk for fall due to : No Fall Risks No Fall Risks No Fall Risks  No Fall Risks  Follow up Falls evaluation completed Falls evaluation completed Falls evaluation completed Falls evaluation completed;Falls prevention discussed Falls prevention discussed   Depression Screen    03/07/2022    2:13 PM 11/24/2021    8:58 AM 08/24/2021    9:12 AM 05/26/2021    9:48 AM 03/13/2021   12:14 PM  Depression screen PHQ 2/9  Decreased Interest 0 0 0 0 0  Down, Depressed, Hopeless 0 0 0 0 0  PHQ - 2 Score 0 0 0 0 0  Altered sleeping 0 0 0 0   Tired, decreased energy 0 0 0 0   Change in appetite 0 0 0 1   Feeling bad or failure about yourself   0 0 0 0   Trouble concentrating 0 0 0 0   Moving slowly or fidgety/restless 0 0 0 0   Suicidal thoughts 0 0 0 0   PHQ-9 Score 0 0 0 1   Difficult doing work/chores Not difficult at all Not difficult at all Not difficult at all  Not difficult at all    Advanced Directives A voluntary discussion about advance care planning including the explanation and discussion of advance directives was extensively discussed  with the patient for 15 minutes with patient.  He does have papers scanned into chart on 11/04/2017.  Explanation about the health care proxy and Living will was reviewed and packet with forms with explanation of how to fill them out was given.  During this discussion, the patient was able to identify a health care proxy as his wife and sons and has papers scanned into chart.  Past Medical History:  Past Medical History:  Diagnosis Date   CAD (coronary artery disease)    a. 06/2017 Cardiac CT: Ca2+ score of 2310 - 95th%'ile;  b. 07/2017 Cath/PCI: LM nl, LAD 60p, 66m LCX 60p, OM2 40p, RCA dominant, 80p (3.5x15 [place]), 451m30d, EF 55-65%.   Cataract    [facility]   Essential hypertension    Hyperlipidemia    Syncope    Wears dentures    full upper and lower    Surgical History:  Past Surgical History:  Procedure Laterality Date   APPENDECTOMY     CARDIAC CATHETERIZATION     CATARACT EXTRACTION, BILATERAL     August 2017   CORONARY STENT INTERVENTION N/A 08/02/2017   Procedure: CORONARY STENT INTERVENTION;  Surgeon: ArWellington HampshireMD;  [facility];  Service: Cardiovascular;  Laterality: N/A;   LEFT HEART CATH AND CORONARY ANGIOGRAPHY Left 08/02/2017   Procedure: LEFT HEART CATH AND CORONARY ANGIOGRAPHY;  Surgeon: GoMinna MerrittsMD;  [facility];  Service: Cardiovascular;  Laterality: Left;   MASS EXCISION Left 07/04/2017   Procedure: REMOVAL TWO CYST EAR EAR LOBE AND EXTERNAL EAR;  Surgeon:  McBeverly GustMD;  [place] [facility] Service: ENT;  Laterality: Left;  LOCAL Diabetic - insulin and oral meds   MOHS SURGERY     [facility]  Medications:  Current Outpatient Medications on File Prior to Visit  Medication Sig   aspirin EC 81 MG tablet Take 81 mg by mouth daily.   atorvastatin (LIPITOR) 40 MG tablet Take 1 tablet (40 mg total) by mouth daily.   Blood Glucose Monitoring Suppl (ONETOUCH VERIO) w/Device KIT 1 Device by Does not apply route daily.   glucose blood test strip OneTouch Verio test strips   Lancet Device MISC 1 Units by Does not apply route daily as needed.   Lancets (ONETOUCH DELICA PLUS GURKYH06C) MISC USE TO CHECK BLOOD SUGAR 2-3 TIMES DAILY   LANTUS SOLOSTAR 100 UNIT/ML Solostar Pen INJECT SUBCUTANEOUSLY 16  UNITS DAILY   magnesium oxide (MAG-OX) 400 (240 Mg) MG tablet TAKE 1 TABLET BY MOUTH TWICE DAILY   metFORMIN (GLUCOPHAGE) 1000 MG tablet TAKE 1 TABLET BY MOUTH  TWICE DAILY WITH A MEAL   metoprolol tartrate (LOPRESSOR) 25 MG tablet Take 0.5 tablets (12.5 mg total) by mouth 2 (two) times daily.   nitroGLYCERIN (NITROSTAT) 0.4 MG SL tablet Place 1 tablet (0.4 mg total) under the tongue every 5 (five) minutes as needed for chest pain.   VELTASSA 8.4 g packet TAKE THE CONTENTS OF 1 PACKET DAILY AS DIRECTED   vitamin B-12 (CYANOCOBALAMIN) 1000 MCG tablet Take 500 mcg by mouth daily.    No current facility-administered medications on file prior to visit.    Allergies:  Allergies  Allergen Reactions   Neosporin [Bacitracin-Polymyxin B] Rash   Ace Inhibitors     Hyperkalemia   Neosporin [Neomycin-Bacitracin Zn-Polymyx]     Red rash     Social History:  Social History   Socioeconomic History   Marital status: Married    Spouse name: Not on file   Number of children: Not on file   Years of education: Not on file   Highest education level: Not on file  Occupational History   Not on file  Tobacco Use    Smoking status: Former    Packs/day: 1.00    Types: Cigarettes    Quit date: 10/27/2011    Years since quitting: 10.3   Smokeless tobacco: Never  Vaping Use   Vaping Use: Never used  Substance and Sexual Activity   Alcohol use: Yes    Alcohol/week: 7.0 - 11.0 standard drinks of alcohol    Types: 1 Glasses of wine, 6 - 10 Shots of liquor per week    Comment: weekly   Drug use: No   Sexual activity: Not Currently  Other Topics Concern   Not on file  Social History Narrative   Not on file   Social Determinants of Health   Financial Resource Strain: Low Risk  (03/13/2021)   Overall Financial Resource Strain (CARDIA)    Difficulty of Paying Living Expenses: Not hard at all  Food Insecurity: No Food Insecurity (03/13/2021)   Hunger Vital Sign    Worried About Running Out of Food in the Last Year: Never true    Ran Out of Food in the Last Year: Never true  Transportation Needs: No Transportation Needs (03/13/2021)   PRAPARE - Hydrologist (Medical): No    Lack of Transportation (Non-Medical): No  Physical Activity: Inactive (03/13/2021)   Exercise Vital Sign    Days of Exercise per Week: 0 days    Minutes of Exercise per Session: 0 min  Stress: No Stress Concern Present (03/13/2021)   [place]    Feeling of Stress : Not at all  Social Connections: Moderately  Isolated (03/13/2021)   Social Connection and Isolation Panel [NHANES]    Frequency of Communication with Friends and Family: Once a week    Frequency of Social Gatherings with Friends and Family: More than three times a week    Attends Religious Services: Never    Marine scientist or Organizations: No    Attends Archivist Meetings: Never    Marital Status: Married  Human resources officer Violence: Not At Risk (03/13/2021)   Humiliation, Afraid, Rape, and Kick questionnaire    Fear of Current or Ex-Partner: No     Emotionally Abused: No    Physically Abused: No    Sexually Abused: No   Social History   Tobacco Use  Smoking Status Former   Packs/day: 1.00   Types: Cigarettes   Quit date: 10/27/2011   Years since quitting: 10.3  Smokeless Tobacco Never   Social History   Substance and Sexual Activity  Alcohol Use Yes   Alcohol/week: 7.0 - 11.0 standard drinks of alcohol   Types: 1 Glasses of wine, 6 - 10 Shots of liquor per week   Comment: weekly    Family History:  Family History  Problem Relation Age of Onset   Diabetes Mother    Arrhythmia Mother 16       Pacemaker implant    Cancer Father        lung   Lung cancer Sister    Kidney cancer Sister     Past medical history, surgical history, medications, allergies, family history and social history reviewed with patient today and changes made to appropriate areas of the chart.   ROS All other ROS negative except what is listed above and in the HPI.      Objective:    BP 97/63   Pulse 74   Temp 97.6 F (36.4 C) (Oral)   Ht 6' 0.5" (1.842 m)   Wt 169 lb 9.6 oz (76.9 kg)   SpO2 96%   BMI 22.69 kg/m   Wt Readings from Last 3 Encounters:  03/07/22 169 lb 9.6 oz (76.9 kg)  02/05/22 169 lb (76.7 kg)  11/24/21 170 lb 9.6 oz (77.4 kg)    Physical Exam Vitals and nursing note reviewed.  Constitutional:      General: He is awake. He is not in acute distress.    Appearance: He is well-developed and well-groomed. He is not ill-appearing or toxic-appearing.  HENT:     Head: Normocephalic and atraumatic.     Right Ear: Hearing, tympanic membrane, ear canal and external ear normal. No drainage.     Left Ear: Hearing, tympanic membrane, ear canal and external ear normal. No drainage.     Nose: Nose normal.     Mouth/Throat:     Pharynx: Uvula midline.  Eyes:     General: Lids are normal.        Right eye: No discharge.        Left eye: No discharge.     Extraocular Movements: Extraocular movements intact.      Conjunctiva/sclera: Conjunctivae normal.     Pupils: Pupils are equal, round, and reactive to light.     Visual Fields: Right eye visual fields normal and left eye visual fields normal.  Neck:     Thyroid: No thyromegaly.     Vascular: No carotid bruit or JVD.     Trachea: Trachea normal.  Cardiovascular:     Rate and Rhythm: Normal rate and regular rhythm.  Heart sounds: Normal heart sounds, S1 normal and S2 normal. No murmur heard.    No gallop.  Pulmonary:     Effort: Pulmonary effort is normal. No accessory muscle usage or respiratory distress.     Breath sounds: Normal breath sounds.  Abdominal:     General: Bowel sounds are normal.     Palpations: Abdomen is soft. There is no hepatomegaly or splenomegaly.     Tenderness: There is no abdominal tenderness.  Musculoskeletal:        General: Normal range of motion.     Cervical back: Normal range of motion and neck supple.     Right lower leg: No edema.     Left lower leg: No edema.  Lymphadenopathy:     Head:     Right side of head: No submental, submandibular, tonsillar, preauricular or posterior auricular adenopathy.     Left side of head: No submental, submandibular, tonsillar, preauricular or posterior auricular adenopathy.     Cervical: No cervical adenopathy.  Skin:    General: Skin is warm and dry.     Capillary Refill: Capillary refill takes less than 2 seconds.     Findings: No rash.  Neurological:     Mental Status: He is alert and oriented to person, place, and time.     Gait: Gait is intact.     Deep Tendon Reflexes: Reflexes are normal and symmetric.     Reflex Scores:      Brachioradialis reflexes are 2+ on the right side and 2+ on the left side.      Patellar reflexes are 2+ on the right side and 2+ on the left side. Psychiatric:        Attention and Perception: Attention normal.        Mood and Affect: Mood normal.        Speech: Speech normal.        Behavior: Behavior normal. Behavior is cooperative.         Thought Content: Thought content normal.        Cognition and Memory: Cognition normal.        Judgment: Judgment normal.       03/07/2022    2:46 PM 02/23/2021    8:41 AM 03/11/2020    1:06 PM 02/18/2019    1:17 PM 11/04/2017   10:41 AM  6CIT Screen  What Year? 0 points 0 points 0 points 0 points 0 points  What month? 0 points 0 points 0 points 0 points 0 points  What time? 0 points 0 points 0 points 0 points 0 points  Count back from 20 0 points 0 points 0 points 0 points 0 points  Months in reverse 0 points 0 points 0 points 0 points 0 points  Repeat phrase 0 points 2 points 2 points 0 points 0 points  Total Score 0 points 2 points 2 points 0 points 0 points   Results for orders placed or performed in visit on 03/07/22  Bayer DCA Hb A1c Waived  Result Value Ref Range   HB A1C (BAYER DCA - WAIVED) 6.9 (H) 4.8 - 5.6 %      Assessment & Plan:   Problem List Items Addressed This Visit       Cardiovascular and Mediastinum   Aortic atherosclerosis (HCC) (Chronic)    Chronic.  Noted on past imaging, continue statin and ASA daily for prevention.      Relevant Orders   Comprehensive metabolic panel  Lipid Panel w/o Chol/HDL Ratio   Coronary artery disease involving native coronary artery with angina pectoris (HCC) (Chronic)    Chronic, stable with no recent NTG use.  Continue current medication regimen and collaboration with cardiology.      Relevant Orders   Comprehensive metabolic panel   Lipid Panel w/o Chol/HDL Ratio   Hypertension associated with diabetes (HCC) (Chronic)    Chronic, stable.  BP on lower side today, at home he reports normal readings.  Continue current medication regimen and collaboration with cardiology.  Adjust as needed.  Recommend he continue to monitor BP occasionally at home and document + focus on DASH diet.  LABS: CBC, CMP, TSH.  Return in 3 months.       Relevant Orders   Bayer DCA Hb A1c Waived (Completed)   CBC with  Differential/Platelet   TSH     Endocrine   Hyperlipidemia associated with type 2 diabetes mellitus (HCC) (Chronic)    Chronic, stable.  Continue current medication regimen and adjust as needed.  Current LDL well controlled.  Lipid panel today.      Relevant Orders   Bayer DCA Hb A1c Waived (Completed)   Comprehensive metabolic panel   Lipid Panel w/o Chol/HDL Ratio   Type 2 diabetes mellitus with hyperglycemia (HCC) - Primary (Chronic)    Chronic, stable with A1c remaining stable at 6.9%, mild trend up.  Urine ALB 10 (January 2023).  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  Continue to monitor BS at home and a post prandial check.  Return in 3 months.      Relevant Orders   Bayer DCA Hb A1c Waived (Completed)     Other   Hyperkalemia (Chronic)    Chronic, stable.  Noted to be trending down with addition of Veltassa.  Continue current medication regimen -- full work-up performed in April 2022 and all WNL.  Consider referral to nephrology or endocrinology as needed.  Discussed with patient.      Relevant Orders   Comprehensive metabolic panel   Magnesium   Other Visit Diagnoses     Benign prostatic hyperplasia without lower urinary tract symptoms       PSA on labs today.   Relevant Orders   PSA   Encounter for annual physical exam       Annual physical today with labs and health maintenance reviewed, discussed with patient.       Discussed aspirin prophylaxis for myocardial infarction prevention and decision was made to continue ASA  LABORATORY TESTING:  Health maintenance labs ordered today as discussed above.   The natural history of prostate cancer and ongoing controversy regarding screening and potential treatment outcomes of prostate cancer has been discussed with the patient. The meaning of a false  positive PSA and a false negative PSA has been discussed. He indicates understanding of the limitations of this screening test and wishes to proceed with screening PSA testing.  IMMUNIZATIONS:   - Tdap: Tetanus vaccination status reviewed: last tetanus booster within 10 years. - Influenza: Up to date - Pneumovax: Up to date - Prevnar: Up to date - Zostavax vaccine: Up to date  SCREENING: - Colonoscopy: Up to date  Discussed with patient purpose of the colonoscopy is to detect colon cancer at curable precancerous or early stages   - AAA  Screening: 03/07/2021 -- negative -Hearing Test: Not applicable  -Spirometry: Not applicable   PATIENT COUNSELING:    Sexuality: Discussed sexually transmitted diseases, partner selection, use of condoms, avoidance of unintended pregnancy  and contraceptive alternatives.   Advised to avoid cigarette smoking.  I discussed with the patient that most people either abstain from alcohol or drink within safe limits (<=14/week and <=4 drinks/occasion for males, <=7/weeks and <= 3 drinks/occasion for females) and that the risk for alcohol disorders and other health effects rises proportionally with the number of drinks per week and how often a drinker exceeds daily limits.  Discussed cessation/primary prevention of drug use and availability of treatment for abuse.   Diet: Encouraged to adjust caloric intake to maintain  or achieve ideal body weight, to reduce intake of dietary saturated fat and total fat, to limit sodium intake by avoiding high sodium foods and not adding table salt, and to maintain adequate dietary potassium and calcium preferably from fresh fruits, vegetables, and low-fat dairy products.    Stressed the importance of regular exercise  Injury prevention: Discussed safety belts, safety helmets, smoke detector, smoking near bedding or upholstery.   Dental health: Discussed importance of regular tooth brushing, flossing, and dental visits.    Follow up plan: NEXT PREVENTATIVE PHYSICAL DUE IN 1 YEAR. Return in about 3 months (around 06/07/2022) for T2DM, HTN/HLD, CAD.

## 2022-03-07 NOTE — Assessment & Plan Note (Signed)
Chronic, stable with no recent NTG use.  Continue current medication regimen and collaboration with cardiology.

## 2022-03-07 NOTE — Assessment & Plan Note (Signed)
Chronic.  Noted on past imaging, continue statin and ASA daily for prevention.

## 2022-03-07 NOTE — Assessment & Plan Note (Signed)
Chronic, stable with A1c remaining stable at 6.9%, mild trend up.  Urine ALB 10 (January 2023).  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  Continue to monitor BS at home and a post prandial check.  Return in 3 months.

## 2022-03-08 ENCOUNTER — Other Ambulatory Visit: Payer: Self-pay | Admitting: Nurse Practitioner

## 2022-03-08 ENCOUNTER — Encounter: Payer: Self-pay | Admitting: Nurse Practitioner

## 2022-03-08 DIAGNOSIS — E039 Hypothyroidism, unspecified: Secondary | ICD-10-CM

## 2022-03-08 DIAGNOSIS — D649 Anemia, unspecified: Secondary | ICD-10-CM

## 2022-03-08 LAB — COMPREHENSIVE METABOLIC PANEL
ALT: 10 IU/L (ref 0–44)
AST: 11 IU/L (ref 0–40)
Albumin/Globulin Ratio: 1.9 (ref 1.2–2.2)
Albumin: 4.5 g/dL (ref 3.8–4.8)
Alkaline Phosphatase: 98 IU/L (ref 44–121)
BUN/Creatinine Ratio: 21 (ref 10–24)
BUN: 23 mg/dL (ref 8–27)
Bilirubin Total: 0.9 mg/dL (ref 0.0–1.2)
CO2: 23 mmol/L (ref 20–29)
Calcium: 9.8 mg/dL (ref 8.6–10.2)
Chloride: 101 mmol/L (ref 96–106)
Creatinine, Ser: 1.09 mg/dL (ref 0.76–1.27)
Globulin, Total: 2.4 g/dL (ref 1.5–4.5)
Glucose: 74 mg/dL (ref 70–99)
Potassium: 5 mmol/L (ref 3.5–5.2)
Sodium: 140 mmol/L (ref 134–144)
Total Protein: 6.9 g/dL (ref 6.0–8.5)
eGFR: 71 mL/min/{1.73_m2} (ref >59)

## 2022-03-08 LAB — LIPID PANEL W/O CHOL/HDL RATIO
Cholesterol, Total: 136 mg/dL (ref 100–199)
HDL: 76 mg/dL (ref >39)
LDL Chol Calc (NIH): 45 mg/dL (ref 0–99)
Triglycerides: 76 mg/dL (ref 0–149)
VLDL Cholesterol Cal: 15 mg/dL (ref 5–40)

## 2022-03-08 LAB — CBC WITH DIFFERENTIAL/PLATELET
Basophils Absolute: 0.1 10*3/uL (ref 0.0–0.2)
Basos: 1 %
EOS (ABSOLUTE): 0.2 10*3/uL (ref 0.0–0.4)
Eos: 3 %
Hematocrit: 38 % (ref 37.5–51.0)
Hemoglobin: 12.9 g/dL — ABNORMAL LOW (ref 13.0–17.7)
Immature Grans (Abs): 0 10*3/uL (ref 0.0–0.1)
Immature Granulocytes: 0 %
Lymphocytes Absolute: 1.5 10*3/uL (ref 0.7–3.1)
Lymphs: 19 %
MCH: 32.2 pg (ref 26.6–33.0)
MCHC: 33.9 g/dL (ref 31.5–35.7)
MCV: 95 fL (ref 79–97)
Monocytes Absolute: 0.5 10*3/uL (ref 0.1–0.9)
Monocytes: 6 %
Neutrophils Absolute: 5.8 10*3/uL (ref 1.4–7.0)
Neutrophils: 71 %
Platelets: 221 10*3/uL (ref 150–450)
RBC: 4.01 x10E6/uL — ABNORMAL LOW (ref 4.14–5.80)
RDW: 13.1 % (ref 11.6–15.4)
WBC: 8.1 10*3/uL (ref 3.4–10.8)

## 2022-03-08 LAB — PSA: Prostate Specific Ag, Serum: 0.5 ng/mL (ref 0.0–4.0)

## 2022-03-08 LAB — MAGNESIUM: Magnesium: 1.7 mg/dL (ref 1.6–2.3)

## 2022-03-08 LAB — TSH: TSH: 4.53 u[IU]/mL — ABNORMAL HIGH (ref 0.450–4.500)

## 2022-03-08 NOTE — Progress Notes (Signed)
Appointment has been made

## 2022-03-08 NOTE — Progress Notes (Signed)
Contacted via [place] -- needs lab only visit in 4 weeks please:) Good afternoon Johnny Wolfe, your labs have returned and are overall stable with exception of CBC showing mildly reduce hemoglobin again and thyroid level (TSH) mildly elevated (meaning thyroid could be sluggish).  Are you taking an supplement called Biotin?  Let me know.  I would like to recheck these two labs in 4 weeks, my staff will call to schedule. - Kidney function, creatinine and eGFR, remains normal, as is liver function, AST and ALT. Potassium and magnesium levels stable. - Cholesterol labs at goal and Prostate lab normal.  Any questions? Keep being amazing!!  Thank you for allowing me to participate in your care.  I appreciate you. Kindest regards, Michaeljoseph Revolorio

## 2022-03-14 DIAGNOSIS — L905 Scar conditions and fibrosis of skin: Secondary | ICD-10-CM | POA: Diagnosis not present

## 2022-03-14 DIAGNOSIS — Z859 Personal history of malignant neoplasm, unspecified: Secondary | ICD-10-CM | POA: Diagnosis not present

## 2022-03-26 ENCOUNTER — Other Ambulatory Visit: Payer: Self-pay | Admitting: Nurse Practitioner

## 2022-03-27 ENCOUNTER — Telehealth: Payer: Self-pay | Admitting: Nurse Practitioner

## 2022-03-27 NOTE — Telephone Encounter (Addendum)
Pt returned office call to schedule AWV. Pt says that he would like to decline. He isn't interested in having visit this year.

## 2022-03-27 NOTE — Telephone Encounter (Signed)
Copied from [place] [id number]. Topic: Medicare AWV >> Mar 27, 2022 10:29 AM Josephina Gip wrote: Reason for CRM: Left message for patient to call back and schedule the Medicare Annual Wellness Visit (AWV) virtually or by telephone.  Last AWV 03/13/21  Please schedule at anytime with [facility]-Nurse Health Advisor.  30 minute appointment  Any questions, please call me at [phone]

## 2022-03-27 NOTE — Telephone Encounter (Signed)
Requested medication (s) are due for refill today - yes  Requested medication (s) are on the active medication list -yes  Future visit scheduled -yes  Last refill: 09/21/21 8.4g 30 each 5RF  Notes to clinic: medication not assigned protocol- provider review   Requested Prescriptions  Pending Prescriptions Disp Refills   VELTASSA 8.4 g packet [Pharmacy Med Name: VELTASSA 8.4 GM PACK] 30 each 5    Sig: TAKE THE CONTENTS OF 1 PACKET DAILY AS DIRECTED     Off-Protocol Failed - 03/26/2022 11:16 AM      Failed - Medication not assigned to a protocol, review manually.      Passed - Valid encounter within last 12 months    Recent Outpatient Visits           2 weeks ago Type 2 diabetes mellitus with hyperglycemia, with long-term current use of insulin ([place])   [place], Jolene T, NP   4 months ago Type 2 diabetes mellitus with hyperglycemia, with long-term current use of insulin ([place])   [place], Jolene T, NP   7 months ago Type 2 diabetes mellitus with hyperglycemia, with long-term current use of insulin ([place])   [place], Jolene T, NP   10 months ago Type 2 diabetes mellitus with hyperglycemia, with long-term current use of insulin ([place])   [place], [place] T, NP   1 year ago Type 2 diabetes mellitus with hyperglycemia, with long-term current use of insulin ([place])   [place], Barbaraann Faster, NP       Future Appointments             In 2 months Cannady, Barbaraann Faster, NP [facility]               Requested Prescriptions  Pending Prescriptions Disp Refills   VELTASSA 8.4 g packet [Pharmacy Med Name: VELTASSA 8.4 GM PACK] 30 each 5    Sig: TAKE THE CONTENTS OF 1 PACKET DAILY AS DIRECTED     Off-Protocol Failed - 03/26/2022 11:16 AM      Failed - Medication not assigned to a protocol, review manually.      Passed - Valid encounter within last 12  months    Recent Outpatient Visits           2 weeks ago Type 2 diabetes mellitus with hyperglycemia, with long-term current use of insulin ([place])   [place], Jolene T, NP   4 months ago Type 2 diabetes mellitus with hyperglycemia, with long-term current use of insulin ([place])   [place], Jolene T, NP   7 months ago Type 2 diabetes mellitus with hyperglycemia, with long-term current use of insulin ([place])   [place], Jolene T, NP   10 months ago Type 2 diabetes mellitus with hyperglycemia, with long-term current use of insulin ([place])   [place], [place] T, NP   1 year ago Type 2 diabetes mellitus with hyperglycemia, with long-term current use of insulin ([place])   [place], Barbaraann Faster, NP       Future Appointments             In 2 months Cannady, Barbaraann Faster, NP [facility]

## 2022-04-05 ENCOUNTER — Other Ambulatory Visit: Payer: Medicare HMO

## 2022-04-05 DIAGNOSIS — D649 Anemia, unspecified: Secondary | ICD-10-CM | POA: Diagnosis not present

## 2022-04-05 DIAGNOSIS — E039 Hypothyroidism, unspecified: Secondary | ICD-10-CM | POA: Diagnosis not present

## 2022-04-06 LAB — TSH: TSH: 4.2 u[IU]/mL (ref 0.450–4.500)

## 2022-04-06 LAB — CBC WITH DIFFERENTIAL/PLATELET
Basophils Absolute: 0.1 10*3/uL (ref 0.0–0.2)
Basos: 1 %
EOS (ABSOLUTE): 0.3 10*3/uL (ref 0.0–0.4)
Eos: 4 %
Hematocrit: 39.5 % (ref 37.5–51.0)
Hemoglobin: 12.9 g/dL — ABNORMAL LOW (ref 13.0–17.7)
Immature Grans (Abs): 0 10*3/uL (ref 0.0–0.1)
Immature Granulocytes: 0 %
Lymphocytes Absolute: 1.3 10*3/uL (ref 0.7–3.1)
Lymphs: 21 %
MCH: 32.3 pg (ref 26.6–33.0)
MCHC: 32.7 g/dL (ref 31.5–35.7)
MCV: 99 fL — ABNORMAL HIGH (ref 79–97)
Monocytes Absolute: 0.5 10*3/uL (ref 0.1–0.9)
Monocytes: 8 %
Neutrophils Absolute: 3.8 10*3/uL (ref 1.4–7.0)
Neutrophils: 66 %
Platelets: 203 10*3/uL (ref 150–450)
RBC: 4 x10E6/uL — ABNORMAL LOW (ref 4.14–5.80)
RDW: 12.9 % (ref 11.6–15.4)
WBC: 6 10*3/uL (ref 3.4–10.8)

## 2022-04-06 LAB — IRON AND TIBC
Iron Saturation: 18 % (ref 15–55)
Iron: 67 ug/dL (ref 38–169)
Total Iron Binding Capacity: 379 ug/dL (ref 250–450)
UIBC: 312 ug/dL (ref 111–343)

## 2022-04-06 LAB — FERRITIN: Ferritin: 37 ng/mL (ref 30–400)

## 2022-04-06 LAB — T4, FREE: Free T4: 1.25 ng/dL (ref 0.82–1.77)

## 2022-04-06 NOTE — Progress Notes (Signed)
Contacted via [place] afternoon Keefe.  Your labs continue to show very mild low hemoglobin, but normal iron.  I recommend continued focus on iron rich foods in diet and we will watch levels.  Thyroid labs are normal this check.  Great news!!  Any questions? Keep being wonderful!!  Thank you for allowing me to participate in your care.  I appreciate you. Kindest regards, Emely Fahy

## 2022-04-25 ENCOUNTER — Other Ambulatory Visit: Payer: Self-pay | Admitting: Nurse Practitioner

## 2022-04-25 DIAGNOSIS — E1165 Type 2 diabetes mellitus with hyperglycemia: Secondary | ICD-10-CM

## 2022-04-26 DIAGNOSIS — H518 Other specified disorders of binocular movement: Secondary | ICD-10-CM | POA: Diagnosis not present

## 2022-04-26 LAB — HM DIABETES EYE EXAM

## 2022-04-27 NOTE — Telephone Encounter (Signed)
Requested Prescriptions  Pending Prescriptions Disp Refills   ONETOUCH VERIO test strip [Pharmacy Med Name: ONETOUCH VERIO STRIP] 100 each 0    Sig: USE TO CHECK BLOOD SUGAR 2-3 TIMES PER DAY AS DIRECTED     Endocrinology: Diabetes - Testing Supplies Passed - 04/25/2022 12:04 PM      Passed - Valid encounter within last 12 months    Recent Outpatient Visits           1 month ago Type 2 diabetes mellitus with hyperglycemia, with long-term current use of insulin ([place])   [place], Jolene T, NP   5 months ago Type 2 diabetes mellitus with hyperglycemia, with long-term current use of insulin ([place])   [place], Jolene T, NP   8 months ago Type 2 diabetes mellitus with hyperglycemia, with long-term current use of insulin ([place])   [place], Jolene T, NP   11 months ago Type 2 diabetes mellitus with hyperglycemia, with long-term current use of insulin ([place])   [place], Ehrhardt T, NP   1 year ago Type 2 diabetes mellitus with hyperglycemia, with long-term current use of insulin ([place])   [place], Barbaraann Faster, NP       Future Appointments             In 1 month Cannady, Barbaraann Faster, NP [facility]

## 2022-05-03 ENCOUNTER — Encounter: Payer: Self-pay | Admitting: Nurse Practitioner

## 2022-05-14 ENCOUNTER — Other Ambulatory Visit: Payer: Self-pay | Admitting: Nurse Practitioner

## 2022-05-15 NOTE — Telephone Encounter (Signed)
Requested Prescriptions  Pending Prescriptions Disp Refills   metFORMIN (GLUCOPHAGE) 1000 MG tablet [Pharmacy Med Name: METFORMIN HCL 1000 MG TAB] 180 tablet 1    Sig: TAKE 1 TABLET BY MOUTH TWICE DAILY WITH MEALS.     Endocrinology:  Diabetes - Biguanides Failed - 05/14/2022 12:52 PM      Failed - B12 Level in normal range and within 720 days    Vitamin B-12  Date Value Ref Range Status  06/25/2019 1,921 (H) 232 - 1,245 pg/mL Final         Passed - Cr in normal range and within 360 days    Creatinine, Ser  Date Value Ref Range Status  03/07/2022 1.09 0.76 - 1.27 mg/dL Final         Passed - HBA1C is between 0 and 7.9 and within 180 days    HB A1C (BAYER DCA - WAIVED)  Date Value Ref Range Status  03/07/2022 6.9 (H) 4.8 - 5.6 % Final    Comment:             Prediabetes: 5.7 - 6.4          Diabetes: >6.4          Glycemic control for adults with diabetes: <7.0          Passed - eGFR in normal range and within 360 days    GFR calc Af Amer  Date Value Ref Range Status  06/16/2020 103 >59 mL/min/1.73 Final    Comment:    **In accordance with recommendations from the NKF-ASN Task force,**   [facility] is in the process of updating its eGFR calculation to the   2021 CKD-EPI creatinine equation that estimates kidney function   without a race variable.    GFR calc non Af Amer  Date Value Ref Range Status  06/16/2020 89 >59 mL/min/1.73 Final   eGFR  Date Value Ref Range Status  03/07/2022 71 >59 mL/min/1.73 Final         Passed - Valid encounter within last 6 months    Recent Outpatient Visits           2 months ago Type 2 diabetes mellitus with hyperglycemia, with long-term current use of insulin ([place])   [place], Jolene T, NP   5 months ago Type 2 diabetes mellitus with hyperglycemia, with long-term current use of insulin ([place])   [place], Jolene T, NP   8 months ago Type 2 diabetes mellitus with hyperglycemia, with  long-term current use of insulin ([place])   [place], Jolene T, NP   11 months ago Type 2 diabetes mellitus with hyperglycemia, with long-term current use of insulin ([place])   [place], [place] T, NP   1 year ago Type 2 diabetes mellitus with hyperglycemia, with long-term current use of insulin ([place])   [place], Daphne T, NP       Future Appointments             In 3 weeks Cannady, Barbaraann Faster, NP [facility]            Passed - CBC within normal limits and completed in the last 12 months    WBC  Date Value Ref Range Status  04/05/2022 6.0 3.4 - 10.8 x10E3/uL Final  06/18/2019 10.2 4.0 - 10.5 K/uL Final   RBC  Date Value Ref Range Status  04/05/2022 4.00 (L) 4.14 - 5.80 x10E6/uL Final  06/18/2019 4.24 4.22 - 5.81 MIL/uL Final   Hemoglobin  Date Value Ref Range Status  04/05/2022 12.9 (L) 13.0 - 17.7 g/dL Final   Hematocrit  Date Value Ref Range Status  04/05/2022 39.5 37.5 - 51.0 % Final   MCHC  Date Value Ref Range Status  04/05/2022 32.7 31.5 - 35.7 g/dL Final  06/18/2019 32.1 30.0 - 36.0 g/dL Final   [facility]  Date Value Ref Range Status  04/05/2022 32.3 26.6 - 33.0 pg Final  06/18/2019 31.6 26.0 - 34.0 pg Final   MCV  Date Value Ref Range Status  04/05/2022 99 (H) 79 - 97 fL Final   No results found for: "PLTCOUNTKUC", "LABPLAT", "POCPLA" RDW  Date Value Ref Range Status  04/05/2022 12.9 11.6 - 15.4 % Final

## 2022-06-07 ENCOUNTER — Other Ambulatory Visit: Payer: Self-pay | Admitting: Nurse Practitioner

## 2022-06-07 ENCOUNTER — Ambulatory Visit (INDEPENDENT_AMBULATORY_CARE_PROVIDER_SITE_OTHER): Payer: Medicare HMO | Admitting: Nurse Practitioner

## 2022-06-07 ENCOUNTER — Encounter: Payer: Self-pay | Admitting: Nurse Practitioner

## 2022-06-07 VITALS — BP 115/75 | HR 67 | Temp 97.5°F | Ht 72.52 in | Wt 176.2 lb

## 2022-06-07 DIAGNOSIS — Z794 Long term (current) use of insulin: Secondary | ICD-10-CM | POA: Diagnosis not present

## 2022-06-07 DIAGNOSIS — I25119 Atherosclerotic heart disease of native coronary artery with unspecified angina pectoris: Secondary | ICD-10-CM

## 2022-06-07 DIAGNOSIS — I700 Atherosclerosis of aorta: Secondary | ICD-10-CM

## 2022-06-07 DIAGNOSIS — E1159 Type 2 diabetes mellitus with other circulatory complications: Secondary | ICD-10-CM

## 2022-06-07 DIAGNOSIS — Z0000 Encounter for general adult medical examination without abnormal findings: Secondary | ICD-10-CM

## 2022-06-07 DIAGNOSIS — E1165 Type 2 diabetes mellitus with hyperglycemia: Secondary | ICD-10-CM | POA: Diagnosis not present

## 2022-06-07 DIAGNOSIS — E1169 Type 2 diabetes mellitus with other specified complication: Secondary | ICD-10-CM | POA: Diagnosis not present

## 2022-06-07 DIAGNOSIS — I152 Hypertension secondary to endocrine disorders: Secondary | ICD-10-CM

## 2022-06-07 DIAGNOSIS — E785 Hyperlipidemia, unspecified: Secondary | ICD-10-CM | POA: Diagnosis not present

## 2022-06-07 DIAGNOSIS — E875 Hyperkalemia: Secondary | ICD-10-CM

## 2022-06-07 MED ORDER — LANTUS SOLOSTAR 100 UNIT/ML ~~LOC~~ SOPN: PEN_INJECTOR | SUBCUTANEOUS | 4 refills | Status: DC

## 2022-06-07 NOTE — Assessment & Plan Note (Signed)
Chronic, stable.  Continue current medication regimen and adjust as needed.  Current LDL well controlled.  Lipid panel today.

## 2022-06-07 NOTE — Assessment & Plan Note (Signed)
Chronic, stable.  BP remains at goal for age.  Continue current medication regimen and collaboration with cardiology.  Adjust as needed.  Recommend he continue to monitor BP occasionally at home and document + focus on DASH diet.  LABS: CMP.  Return in 3 months.

## 2022-06-07 NOTE — Progress Notes (Signed)
BP 115/75   Pulse 67   Temp (!) 97.5 F (36.4 C) (Oral)   Ht 6' 0.52" (1.842 m)   Wt 176 lb 3.2 oz (79.9 kg)   SpO2 92%   BMI 23.56 kg/m    Subjective:    Patient ID: Johnny Wolfe, male    DOB: [birth year], 77 y.o.   MRN: [id number]  HPI: Johnny Wolfe is a 77 y.o. male presenting on 06/07/2022 for Medicare Wellness and follow-up visit. Current medical complaints include: none  He currently lives with: wife Interim Problems from his last visit: no  DIABETES A1c 6.9% November.  Continues on Metformin 1000 MG BID and Lantus 16 units.  Have tried GLP1 and SGLT, were not beneficial -- he lost weight and felt increased fatigue with these medications. Glutamic acid <5.0 and C peptide 2.1 in 2021 on check -- ?Type 1 1/2 vs Type 2. Hypoglycemic episodes: this morning was 59, x one low Polydipsia/polyuria: no Visual disturbance: no Chest pain: no Paresthesias: no Glucose Monitoring: yes             Accucheck frequency: Daily             Fasting glucose: often 100 range, had one 190              Post prandial:              Evening:             Before meals: Taking Insulin?: no             Long acting insulin: Lantus 16 units             Short acting insulin: Blood Pressure Monitoring: daily Retinal Examination: Up to Date Foot Exam: Up to Date Pneumovax: Up to Date Influenza: Up to Date Aspirin: yes   HYPERTENSION / HYPERLIPIDEMIA Continues on ASA and Metoprolol + Lipitor. Last saw cardiology 02/05/22,  history of cardiac cath with stent to proximal RCA in April 2019 -- no changes recent visit.  Continues on Veltassa for hyperkalemia --- work-up showed WNL labs and recent K+ stable.    Has never used NTG.  History of aortic atherosclerosis noted on past imaging.   Satisfied with current treatment? yes Duration of hypertension: chronic BP monitoring frequency: not checking BP range: not checking BP medication side effects: no Duration of hyperlipidemia: chronic Cholesterol  medication side effects: no Cholesterol supplements: none Medication compliance: good compliance Aspirin: yes Recent stressors: no Recurrent headaches: no Visual changes: no Palpitations: no Dyspnea: no Chest pain: no Lower extremity edema: no Dizzy/lightheaded: no The 10-year ASCVD risk score (Arnett DK, et al., 2019) is: 30.6%   Values used to calculate the score:     Age: 41 years     Sex: Male     Is Non-Hispanic African American: No     Diabetic: Yes     Tobacco smoker: No     Systolic Blood Pressure: 678 mmHg     Is BP treated: Yes     HDL Cholesterol: 76 mg/dL     Total Cholesterol: 136 mg/dL   Functional Status Survey: Is the patient deaf or have difficulty hearing?: No Does the patient have difficulty seeing, even when wearing glasses/contacts?: No Does the patient have difficulty concentrating, remembering, or making decisions?: No Does the patient have difficulty walking or climbing stairs?: No Does the patient have difficulty dressing or bathing?: No Does the patient have difficulty doing errands alone such as visiting a  doctor's office or shopping?: No  FALL RISK:    06/07/2022   11:24 AM 03/07/2022    2:10 PM 11/24/2021    8:57 AM 08/24/2021    9:13 AM 03/13/2021   12:17 PM  Fall Risk   Falls in the past year? 0 0 0 0 0  Number falls in past yr: 0 0 0 0 0  Injury with Fall? 0 0 0 0 0  Risk for fall due to : No Fall Risks No Fall Risks No Fall Risks No Fall Risks   Follow up Falls prevention discussed Falls evaluation completed Falls evaluation completed Falls evaluation completed Falls evaluation completed;Falls prevention discussed       06/07/2022   11:25 AM 03/07/2022    2:13 PM 11/24/2021    8:58 AM 08/24/2021    9:12 AM 05/26/2021    9:48 AM  Depression screen PHQ 2/9  Decreased Interest 0 0 0 0 0  Down, Depressed, Hopeless 0 0 0 0 0  PHQ - 2 Score 0 0 0 0 0  Altered sleeping 0 0 0 0 0  Tired, decreased energy 0 0 0 0 0  Change in appetite 0 0 0 0  1  Feeling bad or failure about yourself  0 0 0 0 0  Trouble concentrating 0 0 0 0 0  Moving slowly or fidgety/restless 0 0 0 0 0  Suicidal thoughts 0 0 0 0 0  PHQ-9 Score 0 0 0 0 1  Difficult doing work/chores Not difficult at all Not difficult at all Not difficult at all Not difficult at all     Advanced Directives Does patient have a HCPOA?    yes If yes, name and contact information:  Does patient have a living will or MOST form?  yes  Past Medical History:  Past Medical History:  Diagnosis Date   CAD (coronary artery disease)    a. 06/2017 Cardiac CT: Ca2+ score of 2310 - 95th%'ile;  b. 07/2017 Cath/PCI: LM nl, LAD 60p, 10m LCX 60p, OM2 40p, RCA dominant, 80p (3.5x15 [place]), 4104m30d, EF 55-65%.   Cataract    [facility]   Essential hypertension    Hyperlipidemia    Syncope    Wears dentures    full upper and lower    Surgical History:  Past Surgical History:  Procedure Laterality Date   APPENDECTOMY     CARDIAC CATHETERIZATION     CATARACT EXTRACTION, BILATERAL     August 2017   CORONARY STENT INTERVENTION N/A 08/02/2017   Procedure: CORONARY STENT INTERVENTION;  Surgeon: ArWellington HampshireMD;  [facility];  Service: Cardiovascular;  Laterality: N/A;   LEFT HEART CATH AND CORONARY ANGIOGRAPHY Left 08/02/2017   Procedure: LEFT HEART CATH AND CORONARY ANGIOGRAPHY;  Surgeon: GoMinna MerrittsMD;  [facility];  Service: Cardiovascular;  Laterality: Left;   MASS EXCISION Left 07/04/2017   Procedure: REMOVAL TWO CYST EAR EAR LOBE AND EXTERNAL EAR;  Surgeon: McBeverly GustMD;  [place] [facility] Service: ENT;  Laterality: Left;  LOCAL Diabetic - insulin and oral meds   MOHS SURGERY     [facility]  Medications:  Current Outpatient Medications on File Prior to Visit  Medication Sig   aspirin EC 81 MG tablet Take 81 mg by mouth daily.    atorvastatin (LIPITOR) 40 MG tablet Take 1  tablet (40 mg total) by mouth daily.   Blood Glucose Monitoring Suppl (ONETOUCH VERIO) w/Device KIT 1 Device by Does not apply route daily.   glucose blood (ONETOUCH VERIO) test strip USE TO CHECK BLOOD SUGAR 2-3 TIMES PER DAY AS DIRECTED   Lancet Device MISC 1 Units by Does not apply route daily as needed.   Lancets (ONETOUCH DELICA PLUS [id number]) MISC USE TO CHECK BLOOD SUGAR 2-3 TIMES DAILY   magnesium oxide (MAG-OX) 400 (240 Mg) MG tablet TAKE 1 TABLET BY MOUTH TWICE DAILY   metFORMIN (GLUCOPHAGE) 1000 MG tablet TAKE 1 TABLET BY MOUTH TWICE DAILY WITH MEALS.   metoprolol tartrate (LOPRESSOR) 25 MG tablet Take 0.5 tablets (12.5 mg total) by mouth 2 (two) times daily.   nitroGLYCERIN (NITROSTAT) 0.4 MG SL tablet Place 1 tablet (0.4 mg total) under the tongue every 5 (five) minutes as needed for chest pain.   VELTASSA 8.4 g packet TAKE THE CONTENTS OF 1 PACKET DAILY AS DIRECTED   vitamin B-12 (CYANOCOBALAMIN) 1000 MCG tablet Take 500 mcg by mouth daily.    No current facility-administered medications on file prior to visit.    Allergies:  Allergies  Allergen Reactions   Neosporin [Bacitracin-Polymyxin B] Rash   Ace Inhibitors     Hyperkalemia   Neosporin [Neomycin-Bacitracin Zn-Polymyx]     Red rash     Social History:  Social History   Socioeconomic History   Marital status: Married    Spouse name: Not on file   Number of children: Not on file   Years of education: Not on file   Highest education level: Not on file  Occupational History   Not on file  Tobacco Use   Smoking status: Former    Packs/day: 1.00    Types: Cigarettes    Quit date: 10/27/2011    Years since quitting: 10.6   Smokeless tobacco: Never  Vaping Use   Vaping Use: Never used  Substance and Sexual Activity   Alcohol use: Yes    Alcohol/week: 7.0 - 11.0 standard drinks of alcohol    Types: 1 Glasses of wine, 6 - 10 Shots of liquor per week    Comment:  weekly   Drug use: No   Sexual activity: Not Currently  Other Topics Concern   Not on file  Social History Narrative   Not on file   Social Determinants of Health   Financial Resource Strain: Low Risk  (06/07/2022)   Overall Financial Resource Strain (CARDIA)    Difficulty of Paying Living Expenses: Not hard at all  Food Insecurity: No Food Insecurity (06/07/2022)   Hunger Vital Sign    Worried About Running Out of Food in the Last Year: Never true    Ran Out of Food in the Last Year: Never true  Transportation Needs: No Transportation Needs (06/07/2022)   PRAPARE - Hydrologist (Medical): No    Lack of Transportation (Non-Medical): No  Physical Activity: Sufficiently Active (06/07/2022)   Exercise Vital Sign    Days of Exercise per Week: 5 days    Minutes of Exercise per Session: 40 min  Stress: No Stress Concern Present (06/07/2022)   [place]    Feeling of Stress : Not at all  Social Connections: Moderately Isolated (06/07/2022)   Social Connection and Isolation Panel [NHANES]    Frequency of Communication with Friends and Family: More than three times a week    Frequency  of Social Gatherings with Friends and Family: More than three times a week    Attends Religious Services: Never    Marine scientist or Organizations: No    Attends Archivist Meetings: Never    Marital Status: Married  Human resources officer Violence: Not At Risk (06/07/2022)   Humiliation, Afraid, Rape, and Kick questionnaire    Fear of Current or Ex-Partner: No    Emotionally Abused: No    Physically Abused: No    Sexually Abused: No   Social History   Tobacco Use  Smoking Status Former   Packs/day: 1.00   Types: Cigarettes   Quit date: 10/27/2011   Years since quitting: 10.6  Smokeless Tobacco Never   Social History   Substance and Sexual Activity  Alcohol Use Yes   Alcohol/week: 7.0 - 11.0  standard drinks of alcohol   Types: 1 Glasses of wine, 6 - 10 Shots of liquor per week   Comment: weekly    Family History:  Family History  Problem Relation Age of Onset   Diabetes Mother    Arrhythmia Mother 65       Pacemaker implant    Cancer Father        lung   Lung cancer Sister    Kidney cancer Sister     Past medical history, surgical history, medications, allergies, family history and social history reviewed with patient today and changes made to appropriate areas of the chart.   ROS All other ROS negative except what is listed above and in the HPI.      Objective:    BP 115/75   Pulse 67   Temp (!) 97.5 F (36.4 C) (Oral)   Ht 6' 0.52" (1.842 m)   Wt 176 lb 3.2 oz (79.9 kg)   SpO2 92%   BMI 23.56 kg/m   Wt Readings from Last 3 Encounters:  06/07/22 176 lb 3.2 oz (79.9 kg)  03/07/22 169 lb 9.6 oz (76.9 kg)  02/05/22 169 lb (76.7 kg)    Physical Exam Vitals and nursing note reviewed.  Constitutional:      General: He is awake. He is not in acute distress.    Appearance: He is well-developed and well-groomed. He is not ill-appearing.  HENT:     Head: Normocephalic and atraumatic.     Right Ear: Hearing normal. No drainage.     Left Ear: Hearing normal. No drainage.  Eyes:     General: Lids are normal.        Right eye: No discharge.        Left eye: No discharge.     Conjunctiva/sclera: Conjunctivae normal.     Pupils: Pupils are equal, round, and reactive to light.  Neck:     Vascular: No carotid bruit.  Cardiovascular:     Rate and Rhythm: Normal rate and regular rhythm.     Heart sounds: Normal heart sounds, S1 normal and S2 normal. No murmur heard.    No gallop.  Pulmonary:     Effort: Pulmonary effort is normal. No accessory muscle usage or respiratory distress.     Breath sounds: Normal breath sounds.  Abdominal:     General: Bowel sounds are normal.     Palpations: Abdomen is soft.  Musculoskeletal:        General: Normal range of  motion.     Cervical back: Normal range of motion and neck supple.     Right lower leg: No edema.  Left lower leg: No edema.  Skin:    General: Skin is warm and dry.  Neurological:     Mental Status: He is alert and oriented to person, place, and time.  Psychiatric:        Attention and Perception: Attention normal.        Mood and Affect: Mood normal.        Speech: Speech normal.        Behavior: Behavior normal. Behavior is cooperative.        Thought Content: Thought content normal.        06/07/2022   11:37 AM 03/07/2022    2:46 PM 02/23/2021    8:41 AM 03/11/2020    1:06 PM 02/18/2019    1:17 PM  6CIT Screen  What Year? 0 points 0 points 0 points 0 points 0 points  What month? 0 points 0 points 0 points 0 points 0 points  What time? 0 points 0 points 0 points 0 points 0 points  Count back from 20 0 points 0 points 0 points 0 points 0 points  Months in reverse 0 points 0 points 0 points 0 points 0 points  Repeat phrase 0 points 0 points 2 points 2 points 0 points  Total Score 0 points 0 points 2 points 2 points 0 points   Results for orders placed or performed in visit on 05/03/22  HM DIABETES EYE EXAM  Result Value Ref Range   HM Diabetic Eye Exam No Retinopathy No Retinopathy      Assessment & Plan:   Problem List Items Addressed This Visit       Cardiovascular and Mediastinum   Aortic atherosclerosis (HCC) (Chronic)    Chronic.  Noted on past imaging, continue statin and ASA daily for prevention.      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  Continue current medication regimen and collaboration with cardiology.      Relevant Orders   Comprehensive metabolic panel   Lipid Panel w/o Chol/HDL Ratio   Hypertension associated with diabetes (HCC) (Chronic)    Chronic, stable.   BP remains at goal for age.  Continue current medication regimen and collaboration with cardiology.  Adjust as needed.  Recommend he continue to monitor BP occasionally at home and document + focus on DASH diet.  LABS: CMP.  Return in 3 months.       Relevant Medications   insulin glargine (LANTUS SOLOSTAR) 100 UNIT/ML Solostar Pen   Other Relevant Orders   HgB A1c   Urine Microalbumin w/creat. ratio   Comprehensive metabolic panel     Endocrine   Hyperlipidemia associated with type 2 diabetes mellitus (HCC) (Chronic)    Chronic, stable.  Continue current medication regimen and adjust as needed.  Current LDL well controlled.  Lipid panel today.      Relevant Medications   insulin glargine (LANTUS SOLOSTAR) 100 UNIT/ML Solostar Pen   Other Relevant Orders   HgB A1c   Comprehensive metabolic panel   Lipid Panel w/o Chol/HDL Ratio   Type 2 diabetes mellitus with hyperglycemia (HCC) (Chronic)    Chronic, stable with A1c 6.9% in November, recheck today.  Urine ALB 10 (January 2023), recheck today.  Stable with Lantus and Metformin use, will most likely need to maintain on long term Lantus as  tolerates this well -- he is aware to reduce dosing if BS too low consistently, suspect more Type 1.5 diabetic.  Continue to monitor BS at home and a post prandial check.  Return in 3 months.      Relevant Medications   insulin glargine (LANTUS SOLOSTAR) 100 UNIT/ML Solostar Pen   Other Relevant Orders   HgB A1c   Urine Microalbumin w/creat. ratio     Other   Hyperkalemia (Chronic)    Chronic, stable.  Stable levels with Veltassa on board.  Continue current medication regimen -- full work-up performed in April 2022 and all WNL.  Consider referral to nephrology or endocrinology as needed.  Discussed with patient.      Relevant Orders   Comprehensive metabolic panel   Other Visit Diagnoses     Medicare annual wellness visit, subsequent    -  Primary   Medicare wellness due and performed with  patient at visit today.       Discussed aspirin prophylaxis for myocardial infarction prevention and decision was made to continue ASA  LABORATORY TESTING:  Health maintenance labs ordered today as discussed above.   IMMUNIZATIONS:   - Tdap: Tetanus vaccination status reviewed: last tetanus booster within 10 years. - Influenza: Up to date - Pneumovax: Up to date - Prevnar: Up to date - Zostavax vaccine: Up to date  SCREENING: - Colonoscopy: Up to date  Discussed with patient purpose of the colonoscopy is to detect colon cancer at curable precancerous or early stages   - AAA Screening: Up to date  -Hearing Test: Not applicable  -Spirometry: Not applicable   PATIENT COUNSELING:    Sexuality: Discussed sexually transmitted diseases, partner selection, use of condoms, avoidance of unintended pregnancy  and contraceptive alternatives.   Advised to avoid cigarette smoking.  I discussed with the patient that most people either abstain from alcohol or drink within safe limits (<=14/week and <=4 drinks/occasion for males, <=7/weeks and <= 3 drinks/occasion for females) and that the risk for alcohol disorders and other health effects rises proportionally with the number of drinks per week and how often a drinker exceeds daily limits.  Discussed cessation/primary prevention of drug use and availability of treatment for abuse.   Diet: Encouraged to adjust caloric intake to maintain  or achieve ideal body weight, to reduce intake of dietary saturated fat and total fat, to limit sodium intake by avoiding high sodium foods and not adding table salt, and to maintain adequate dietary potassium and calcium preferably from fresh fruits, vegetables, and low-fat dairy products.    Stressed the importance of regular exercise  Injury prevention: Discussed safety belts, safety helmets, smoke detector, smoking near bedding or upholstery.   Dental health: Discussed importance of regular tooth brushing,  flossing, and dental visits.   Follow up plan: NEXT PREVENTATIVE PHYSICAL DUE IN 1 YEAR. Return in about 3 months (around 09/05/2022) for T2DM, HTN/HLD.

## 2022-06-07 NOTE — Assessment & Plan Note (Signed)
Chronic, stable.  Stable levels with Veltassa on board.  Continue current medication regimen -- full work-up performed in April 2022 and all WNL.  Consider referral to nephrology or endocrinology as needed.  Discussed with patient.

## 2022-06-07 NOTE — Assessment & Plan Note (Signed)
Chronic, stable with A1c 6.9% in November, recheck today.  Urine ALB 10 (January 2023), recheck today.  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  Continue to monitor BS at home and a post prandial check.  Return in 3 months.

## 2022-06-07 NOTE — Assessment & Plan Note (Signed)
Chronic.  Noted on past imaging, continue statin and ASA daily for prevention.

## 2022-06-07 NOTE — Patient Instructions (Signed)
Diabetes Mellitus Basics  Diabetes mellitus, or diabetes, is a long-term (chronic) disease. It occurs when the body does not properly use sugar (glucose) that is released from food after you eat. Diabetes mellitus may be caused by one or both of these problems: Your pancreas does not make enough of a hormone called insulin. Your body does not react in a normal way to the insulin that it makes. Insulin lets glucose enter cells in your body. This gives you energy. If you have diabetes, glucose cannot get into cells. This causes high blood glucose (hyperglycemia). How to treat and manage diabetes You may need to take insulin or other diabetes medicines daily to keep your glucose in balance. If you are prescribed insulin, you will learn how to give yourself insulin by injection. You may need to adjust the amount of insulin you take based on the foods that you eat. You will need to check your blood glucose levels using a glucose monitor as told by your health care provider. The readings can help determine if you have low or high blood glucose. Generally, you should have these blood glucose levels: Before meals (preprandial): 80-130 mg/dL (4.4-7.2 mmol/L). After meals (postprandial): below 180 mg/dL (10 mmol/L). Hemoglobin A1c (HbA1c) level: less than 7%. Your health care provider will set treatment goals for you. Keep all follow-up visits. This is important. Follow these instructions at home: Diabetes medicines Take your diabetes medicines every day as told by your health care provider. List your diabetes medicines here: Name of medicine: ______________________________ Amount (dose): _______________ Time (a.m./p.m.): _______________ Notes: ___________________________________ Name of medicine: ______________________________ Amount (dose): _______________ Time (a.m./p.m.): _______________ Notes: ___________________________________ Name of medicine: ______________________________ Amount (dose):  _______________ Time (a.m./p.m.): _______________ Notes: ___________________________________ Insulin If you use insulin, list the types of insulin you use here: Insulin type: ______________________________ Amount (dose): _______________ Time (a.m./p.m.): _______________Notes: ___________________________________ Insulin type: ______________________________ Amount (dose): _______________ Time (a.m./p.m.): _______________ Notes: ___________________________________ Insulin type: ______________________________ Amount (dose): _______________ Time (a.m./p.m.): _______________ Notes: ___________________________________ Insulin type: ______________________________ Amount (dose): _______________ Time (a.m./p.m.): _______________ Notes: ___________________________________ Insulin type: ______________________________ Amount (dose): _______________ Time (a.m./p.m.): _______________ Notes: ___________________________________ Managing blood glucose  Check your blood glucose levels using a glucose monitor as told by your health care provider. Write down the times that you check your glucose levels here: Time: _______________ Notes: ___________________________________ Time: _______________ Notes: ___________________________________ Time: _______________ Notes: ___________________________________ Time: _______________ Notes: ___________________________________ Time: _______________ Notes: ___________________________________ Time: _______________ Notes: ___________________________________  Low blood glucose Low blood glucose (hypoglycemia) is when glucose is at or below 70 mg/dL (3.9 mmol/L). Symptoms may include: Feeling: Hungry. Sweaty and clammy. Irritable or easily upset. Dizzy. Sleepy. Having: A fast heartbeat. A headache. A change in your vision. Numbness around the mouth, lips, or tongue. Having trouble with: Moving (coordination). Sleeping. Treating low blood glucose To treat low blood  glucose, eat or drink something containing sugar right away. If you can think clearly and swallow safely, follow the 15:15 rule: Take 15 grams of a fast-acting carb (carbohydrate), as told by your health care provider. Some fast-acting carbs are: Glucose tablets: take 3-4 tablets. Hard candy: eat 3-5 pieces. Fruit juice: drink 4 oz (120 mL). Regular (not diet) soda: drink 4-6 oz (120-180 mL). Honey or sugar: eat 1 Tbsp (15 mL). Check your blood glucose levels 15 minutes after you take the carb. If your glucose is still at or below 70 mg/dL (3.9 mmol/L), take 15 grams of a carb again. If your glucose does not go above 70 mg/dL (3.9 mmol/L) after   3 tries, get help right away. After your glucose goes back to normal, eat a meal or a snack within 1 hour. Treating very low blood glucose If your glucose is at or below 54 mg/dL (3 mmol/L), you have very low blood glucose (severe hypoglycemia). This is an emergency. Do not wait to see if the symptoms will go away. Get medical help right away. Call your local emergency services (911 in the [place] Do not drive yourself to the hospital. Questions to ask your health care provider Should I talk with a diabetes educator? What equipment will I need to care for myself at home? What diabetes medicines do I need? When should I take them? How often do I need to check my blood glucose levels? What number can I call if I have questions? When is my follow-up visit? Where can I find a support group for people with diabetes? Where to find more information American Diabetes Association: www.diabetes.org Association of Diabetes [employer] and Education Specialists: www.diabeteseducator.org Contact a health care provider if: Your blood glucose is at or above 240 mg/dL (13.3 mmol/L) for 2 days in a row. You have been sick or have had a fever for 2 days or more, and you are not getting better. You have any of these problems for more than 6 hours: You cannot eat or  drink. You feel nauseous. You vomit. You have diarrhea. Get help right away if: Your blood glucose is lower than 54 mg/dL (3 mmol/L). You get confused. You have trouble thinking clearly. You have trouble breathing. These symptoms may represent a serious problem that is an emergency. Do not wait to see if the symptoms will go away. Get medical help right away. Call your local emergency services (911 in the [place] Do not drive yourself to the hospital. Summary Diabetes mellitus is a chronic disease that occurs when the body does not properly use sugar (glucose) that is released from food after you eat. Take insulin and diabetes medicines as told. Check your blood glucose every day, as often as told. Keep all follow-up visits. This is important. This information is not intended to replace advice given to you by your health care provider. Make sure you discuss any questions you have with your health care provider. Document Revised: 08/18/2019 Document Reviewed: 08/18/2019 [employer] Patient Education  2023 [employer]  

## 2022-06-07 NOTE — Assessment & Plan Note (Signed)
Chronic, stable with no recent NTG use.  Continue current medication regimen and collaboration with cardiology.

## 2022-06-08 NOTE — Progress Notes (Signed)
Contacted via MyChart   Good morning Rafiq, your labs have returned: - Kidney function, creatinine and eGFR, remains normal, as is liver function, AST and ALT.  - A1c 6.5%, remains stable and below goal.  Has trended down from 6.9%. - Cholesterol levels at goal.  Continue all current medications.  Any questions? Keep being stellar!!  Thank you for allowing me to participate in your care.  I appreciate you. Kindest regards, Akoni Parton

## 2022-06-12 LAB — COMPREHENSIVE METABOLIC PANEL
ALT: 11 IU/L (ref 0–44)
AST: 14 IU/L (ref 0–40)
Albumin/Globulin Ratio: 2.3 — ABNORMAL HIGH (ref 1.2–2.2)
Albumin: 4.5 g/dL (ref 3.8–4.8)
Alkaline Phosphatase: 94 IU/L (ref 44–121)
BUN/Creatinine Ratio: 22 (ref 10–24)
BUN: 18 mg/dL (ref 8–27)
Bilirubin Total: 0.6 mg/dL (ref 0.0–1.2)
CO2: 22 mmol/L (ref 20–29)
Calcium: 9.3 mg/dL (ref 8.6–10.2)
Chloride: 102 mmol/L (ref 96–106)
Creatinine, Ser: 0.81 mg/dL (ref 0.76–1.27)
Globulin, Total: 2 g/dL (ref 1.5–4.5)
Glucose: 77 mg/dL (ref 70–99)
Potassium: 5.2 mmol/L (ref 3.5–5.2)
Sodium: 141 mmol/L (ref 134–144)
Total Protein: 6.5 g/dL (ref 6.0–8.5)
eGFR: 93 mL/min/{1.73_m2} (ref >59)

## 2022-06-12 LAB — HEMOGLOBIN A1C
Est. average glucose Bld gHb Est-mCnc: 140 mg/dL
Hgb A1c MFr Bld: 6.5 % — ABNORMAL HIGH (ref 4.8–5.6)

## 2022-06-12 LAB — LIPID PANEL W/O CHOL/HDL RATIO
Cholesterol, Total: 124 mg/dL (ref 100–199)
HDL: 82 mg/dL (ref >39)
LDL Chol Calc (NIH): 29 mg/dL (ref 0–99)
Triglycerides: 56 mg/dL (ref 0–149)
VLDL Cholesterol Cal: 13 mg/dL (ref 5–40)

## 2022-06-12 LAB — MICROALBUMIN / CREATININE URINE RATIO

## 2022-06-12 LAB — SPECIMEN STATUS REPORT

## 2022-07-31 ENCOUNTER — Other Ambulatory Visit: Payer: Self-pay | Admitting: Nurse Practitioner

## 2022-07-31 DIAGNOSIS — Z794 Long term (current) use of insulin: Secondary | ICD-10-CM

## 2022-07-31 NOTE — Telephone Encounter (Signed)
Rx- metformin 05/15/22 #180 1RF- too soon Requested Prescriptions  Pending Prescriptions Disp Refills   ONETOUCH VERIO test strip [Pharmacy Med Name: ONETOUCH VERIO STRIP] 100 each 0    Sig: USE TO [place] 2-3 TIMES PER DAY AS DIRECTED     Endocrinology: Diabetes - Testing Supplies Passed - 07/31/2022 10:27 AM      Passed - Valid encounter within last 12 months    Recent Outpatient Visits           1 month ago Medicare annual wellness visit, subsequent   [place], [place] T, NP   4 months ago Type 2 diabetes mellitus with hyperglycemia, with long-term current use of insulin ([place])   [place], [place] T, NP   8 months ago Type 2 diabetes mellitus with hyperglycemia, with long-term current use of insulin ([place])   [place], [place] T, NP   11 months ago Type 2 diabetes mellitus with hyperglycemia, with long-term current use of insulin ([place])   [place], [place] T, NP   1 year ago Type 2 diabetes mellitus with hyperglycemia, with long-term current use of insulin ([place])   [place], Barbaraann Faster, NP       Future Appointments             In 1 month Cannady, Barbaraann Faster, NP [place], [facility]             metFORMIN (GLUCOPHAGE) 1000 MG tablet [Pharmacy Med Name: METFORMIN HCL 1000 MG TAB] 180 tablet 1    Sig: TAKE 1 TABLET BY MOUTH TWICE DAILY WITH MEALS.     Endocrinology:  Diabetes - Biguanides Failed - 07/31/2022 10:27 AM      Failed - B12 Level in normal range and within 720 days    Vitamin B-12  Date Value Ref Range Status  06/25/2019 1,921 (H) 232 - 1,245 pg/mL Final         Passed - Cr in normal range and within 360 days    Creatinine, Ser  Date Value Ref Range Status  06/07/2022 0.81 0.76 - 1.27 mg/dL Final         Passed - HBA1C is between 0 and 7.9 and within 180 days    HB  A1C (BAYER DCA - WAIVED)  Date Value Ref Range Status  03/07/2022 6.9 (H) 4.8 - 5.6 % Final    Comment:             Prediabetes: 5.7 - 6.4          Diabetes: >6.4          Glycemic control for adults with diabetes: <7.0    Hgb A1c MFr Bld  Date Value Ref Range Status  06/07/2022 6.5 (H) 4.8 - 5.6 % Final    Comment:             Prediabetes: 5.7 - 6.4          Diabetes: >6.4          Glycemic control for adults with diabetes: <7.0          Passed - eGFR in normal range and within 360 days    GFR calc Af Amer  Date Value Ref Range Status  06/16/2020 103 >59 mL/min/1.73 Final    Comment:    **In accordance with recommendations from the NKF-ASN Task force,**  [facility] is in the process of updating its eGFR calculation to the   2021 CKD-EPI creatinine equation that estimates kidney function   without a race variable.    GFR calc non Af Amer  Date Value Ref Range Status  06/16/2020 89 >59 mL/min/1.73 Final   eGFR  Date Value Ref Range Status  06/07/2022 93 >59 mL/min/1.73 Final         Passed - Valid encounter within last 6 months    Recent Outpatient Visits           1 month ago Medicare annual wellness visit, subsequent   [place], [place] T, NP   4 months ago Type 2 diabetes mellitus with hyperglycemia, with long-term current use of insulin ([place])   [place], [place] T, NP   8 months ago Type 2 diabetes mellitus with hyperglycemia, with long-term current use of insulin ([place])   [place], Heron T, NP   11 months ago Type 2 diabetes mellitus with hyperglycemia, with long-term current use of insulin ([place])   [place], [place] T, NP   1 year ago Type 2 diabetes mellitus with hyperglycemia, with long-term current use of insulin ([place])   [place], Henrine Screws T, NP       Future Appointments              In 1 month Cannady, Barbaraann Faster, NP [place], [facility]            Passed - CBC within normal limits and completed in the last 12 months    WBC  Date Value Ref Range Status  04/05/2022 6.0 3.4 - 10.8 x10E3/uL Final  06/18/2019 10.2 4.0 - 10.5 K/uL Final   RBC  Date Value Ref Range Status  04/05/2022 4.00 (L) 4.14 - 5.80 x10E6/uL Final  06/18/2019 4.24 4.22 - 5.81 MIL/uL Final   Hemoglobin  Date Value Ref Range Status  04/05/2022 12.9 (L) 13.0 - 17.7 g/dL Final   Hematocrit  Date Value Ref Range Status  04/05/2022 39.5 37.5 - 51.0 % Final   MCHC  Date Value Ref Range Status  04/05/2022 32.7 31.5 - 35.7 g/dL Final  06/18/2019 32.1 30.0 - 36.0 g/dL Final   [facility]  Date Value Ref Range Status  04/05/2022 32.3 26.6 - 33.0 pg Final  06/18/2019 31.6 26.0 - 34.0 pg Final   MCV  Date Value Ref Range Status  04/05/2022 99 (H) 79 - 97 fL Final   No results found for: "PLTCOUNTKUC", "LABPLAT", "POCPLA" RDW  Date Value Ref Range Status  04/05/2022 12.9 11.6 - 15.4 % Final

## 2022-09-02 NOTE — Patient Instructions (Signed)
Be Involved in Your Health Care:  Taking Medications When medications are taken as directed, they can greatly improve your health. But if they are not taken as instructed, they may not work. In some cases, not taking them correctly can be harmful. To help ensure your treatment remains effective and safe, understand your medications and how to take them.  Your lab results, notes and after visit summary will be available on My Chart. We strongly encourage you to use this feature. If lab results are abnormal the clinic will contact you with the appropriate steps. If the clinic does not contact you assume the results are satisfactory. You can always see your results on My Chart. If you have questions regarding your condition, please contact the clinic during office hours. You can also ask questions on My Chart.  We at [facility] are grateful that you chose us to provide care. We strive to provide excellent and compassionate care and are always looking for feedback. If you get a survey from the clinic please complete this.   Diabetes Mellitus Basics  Diabetes mellitus, or diabetes, is a long-term (chronic) disease. It occurs when the body does not properly use sugar (glucose) that is released from food after you eat. Diabetes mellitus may be caused by one or both of these problems: Your pancreas does not make enough of a hormone called insulin. Your body does not react in a normal way to the insulin that it makes. Insulin lets glucose enter cells in your body. This gives you energy. If you have diabetes, glucose cannot get into cells. This causes high blood glucose (hyperglycemia). How to treat and manage diabetes You may need to take insulin or other diabetes medicines daily to keep your glucose in balance. If you are prescribed insulin, you will learn how to give yourself insulin by injection. You may need to adjust the amount of insulin you take based on the foods that you eat. You will  need to check your blood glucose levels using a glucose monitor as told by your health care provider. The readings can help determine if you have low or high blood glucose. Generally, you should have these blood glucose levels: Before meals (preprandial): 80-130 mg/dL (4.4-7.2 mmol/L). After meals (postprandial): below 180 mg/dL (10 mmol/L). Hemoglobin A1c (HbA1c) level: less than 7%. Your health care provider will set treatment goals for you. Keep all follow-up visits. This is important. Follow these instructions at home: Diabetes medicines Take your diabetes medicines every day as told by your health care provider. List your diabetes medicines here: Name of medicine: ______________________________ Amount (dose): _______________ Time (a.m./p.m.): _______________ Notes: ___________________________________ Name of medicine: ______________________________ Amount (dose): _______________ Time (a.m./p.m.): _______________ Notes: ___________________________________ Name of medicine: ______________________________ Amount (dose): _______________ Time (a.m./p.m.): _______________ Notes: ___________________________________ Insulin If you use insulin, list the types of insulin you use here: Insulin type: ______________________________ Amount (dose): _______________ Time (a.m./p.m.): _______________Notes: ___________________________________ Insulin type: ______________________________ Amount (dose): _______________ Time (a.m./p.m.): _______________ Notes: ___________________________________ Insulin type: ______________________________ Amount (dose): _______________ Time (a.m./p.m.): _______________ Notes: ___________________________________ Insulin type: ______________________________ Amount (dose): _______________ Time (a.m./p.m.): _______________ Notes: ___________________________________ Insulin type: ______________________________ Amount (dose): _______________ Time (a.m./p.m.): _______________  Notes: ___________________________________ Managing blood glucose  Check your blood glucose levels using a glucose monitor as told by your health care provider. Write down the times that you check your glucose levels here: Time: _______________ Notes: ___________________________________ Time: _______________ Notes: ___________________________________ Time: _______________ Notes: ___________________________________ Time: _______________ Notes: ___________________________________ Time: _______________ Notes: ___________________________________ Time: _______________ Notes: ___________________________________    Low blood glucose Low blood glucose (hypoglycemia) is when glucose is at or below 70 mg/dL (3.9 mmol/L). Symptoms may include: Feeling: Hungry. Sweaty and clammy. Irritable or easily upset. Dizzy. Sleepy. Having: A fast heartbeat. A headache. A change in your vision. Numbness around the mouth, lips, or tongue. Having trouble with: Moving (coordination). Sleeping. Treating low blood glucose To treat low blood glucose, eat or drink something containing sugar right away. If you can think clearly and swallow safely, follow the 15:15 rule: Take 15 grams of a fast-acting carb (carbohydrate), as told by your health care provider. Some fast-acting carbs are: Glucose tablets: take 3-4 tablets. Hard candy: eat 3-5 pieces. Fruit juice: drink 4 oz (120 mL). Regular (not diet) soda: drink 4-6 oz (120-180 mL). Honey or sugar: eat 1 Tbsp (15 mL). Check your blood glucose levels 15 minutes after you take the carb. If your glucose is still at or below 70 mg/dL (3.9 mmol/L), take 15 grams of a carb again. If your glucose does not go above 70 mg/dL (3.9 mmol/L) after 3 tries, get help right away. After your glucose goes back to normal, eat a meal or a snack within 1 hour. Treating very low blood glucose If your glucose is at or below 54 mg/dL (3 mmol/L), you have very low blood glucose  (severe hypoglycemia). This is an emergency. Do not wait to see if the symptoms will go away. Get medical help right away. Call your local emergency services (911 in the [place] Do not drive yourself to the hospital. Questions to ask your health care provider Should I talk with a diabetes educator? What equipment will I need to care for myself at home? What diabetes medicines do I need? When should I take them? How often do I need to check my blood glucose levels? What number can I call if I have questions? When is my follow-up visit? Where can I find a support group for people with diabetes? Where to find more information American Diabetes Association: www.diabetes.org Association of Diabetes Care and Education Specialists: www.diabeteseducator.org Contact a health care provider if: Your blood glucose is at or above 240 mg/dL (13.3 mmol/L) for 2 days in a row. You have been sick or have had a fever for 2 days or more, and you are not getting better. You have any of these problems for more than 6 hours: You cannot eat or drink. You feel nauseous. You vomit. You have diarrhea. Get help right away if: Your blood glucose is lower than 54 mg/dL (3 mmol/L). You get confused. You have trouble thinking clearly. You have trouble breathing. These symptoms may represent a serious problem that is an emergency. Do not wait to see if the symptoms will go away. Get medical help right away. Call your local emergency services (911 in the [place] Do not drive yourself to the hospital. Summary Diabetes mellitus is a chronic disease that occurs when the body does not properly use sugar (glucose) that is released from food after you eat. Take insulin and diabetes medicines as told. Check your blood glucose every day, as often as told. Keep all follow-up visits. This is important. This information is not intended to replace advice given to you by your health care provider. Make sure you discuss any  questions you have with your health care provider. Document Revised: 08/18/2019 Document Reviewed: 08/18/2019 Elsevier Patient Education  2023 Elsevier Inc.  

## 2022-09-05 ENCOUNTER — Ambulatory Visit (INDEPENDENT_AMBULATORY_CARE_PROVIDER_SITE_OTHER): Payer: Medicare HMO | Admitting: Nurse Practitioner

## 2022-09-05 ENCOUNTER — Encounter: Payer: Self-pay | Admitting: Nurse Practitioner

## 2022-09-05 VITALS — BP 131/76 | HR 76 | Temp 97.8°F | Ht 72.52 in | Wt 170.5 lb

## 2022-09-05 DIAGNOSIS — E1169 Type 2 diabetes mellitus with other specified complication: Secondary | ICD-10-CM

## 2022-09-05 DIAGNOSIS — E1159 Type 2 diabetes mellitus with other circulatory complications: Secondary | ICD-10-CM

## 2022-09-05 DIAGNOSIS — I700 Atherosclerosis of aorta: Secondary | ICD-10-CM

## 2022-09-05 DIAGNOSIS — I152 Hypertension secondary to endocrine disorders: Secondary | ICD-10-CM

## 2022-09-05 DIAGNOSIS — Z794 Long term (current) use of insulin: Secondary | ICD-10-CM | POA: Diagnosis not present

## 2022-09-05 DIAGNOSIS — E785 Hyperlipidemia, unspecified: Secondary | ICD-10-CM

## 2022-09-05 DIAGNOSIS — E1165 Type 2 diabetes mellitus with hyperglycemia: Secondary | ICD-10-CM

## 2022-09-05 DIAGNOSIS — E875 Hyperkalemia: Secondary | ICD-10-CM | POA: Diagnosis not present

## 2022-09-05 DIAGNOSIS — I25119 Atherosclerotic heart disease of native coronary artery with unspecified angina pectoris: Secondary | ICD-10-CM

## 2022-09-05 LAB — BAYER DCA HB A1C WAIVED: HB A1C (BAYER DCA - WAIVED): 6.5 % — ABNORMAL HIGH (ref 4.8–5.6)

## 2022-09-05 NOTE — Assessment & Plan Note (Signed)
Chronic, stable.  Continue current medication regimen and adjust as needed.  Current LDL well controlled.  Lipid panel up to date.

## 2022-09-05 NOTE — Assessment & Plan Note (Signed)
Chronic, stable.  BP remains at goal for age.  Continue current medication regimen and collaboration with cardiology.  Adjust as needed.  Recommend he continue to monitor BP occasionally at home and document + focus on DASH diet.  LABS: BMP.  Return in 3 months.

## 2022-09-05 NOTE — Assessment & Plan Note (Signed)
Chronic.  Noted on past imaging, continue statin and ASA daily for prevention. 

## 2022-09-05 NOTE — Assessment & Plan Note (Signed)
Chronic, stable with A1c 6.5% today, remaining at goal.  Urine ALB 10 (January 2023), recheck next visit, it was not run in February when obtained.  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  Continue to monitor BS at home and a post prandial check.  Return in 3 months.

## 2022-09-05 NOTE — Assessment & Plan Note (Signed)
Chronic, stable.  Stable levels with Veltassa on board.  Continue current medication regimen -- full work-up performed in April 2022 and all WNL.  Consider referral to nephrology or endocrinology as needed.  Discussed with patient. 

## 2022-09-05 NOTE — Assessment & Plan Note (Signed)
Chronic, stable with no recent NTG use.  Continue current medication regimen and collaboration with cardiology. °

## 2022-09-05 NOTE — Progress Notes (Signed)
BP 131/76   Pulse 76   Temp 97.8 F (36.6 C) (Oral)   Ht 6' 0.52" (1.842 m)   Wt 170 lb 8 oz (77.3 kg)   SpO2 95%   BMI 22.79 kg/m    Subjective:    Patient ID: Johnny Wolfe, male    DOB: 10-Oct-1945, 77 y.o.   MRN: [id number]  HPI: Johnny Wolfe is a 77 y.o. male  Chief Complaint  Patient presents with   Diabetes   DIABETES A1c February 6.5%.  Continues on Metformin 1000 MG BID and Lantus 16 units.  Have tried GLP1 and SGLT2, but these were not beneficial -- he lost weight and felt increased fatigue with these medications.    History: Glutamic acid <5.0 and C peptide 2.1 in 2021 on check -- ?Type 1 1/2 vs Type 2. Hypoglycemic episodes: x 6 lows since last visit --- eats breakfast or a sweet snack Polydipsia/polyuria: no Visual disturbance: no Chest pain: no Paresthesias: no Glucose Monitoring: yes             Accucheck frequency: Daily             Fasting glucose: 49 to 175 -- on average 100 to 110 range             Post prandial:              Evening:             Before meals: Taking Insulin?: no             Long acting insulin: Lantus 16 units             Short acting insulin: Blood Pressure Monitoring: daily Retinal Examination: Up to Date Foot Exam: Up to Date Pneumovax: Up to Date Influenza: Up to Date Aspirin: yes   HYPERTENSION / HYPERLIPIDEMIA Continues on ASA and Metoprolol + Lipitor. Last saw cardiology 02/05/22.  He has history of cardiac cath with stent to proximal RCA in April 2019 -- no changes recent visit.  Continues on Veltassa for hyperkalemia --- work-up showed WNL labs and recent K+ stable.    Has never used NTG.  History of aortic atherosclerosis noted on past imaging.   Satisfied with current treatment? yes Duration of hypertension: chronic BP monitoring frequency: not checking BP range: not checking BP medication side effects: no Duration of hyperlipidemia: chronic Cholesterol medication side effects: no Cholesterol supplements:  none Medication compliance: good compliance Aspirin: yes Recent stressors: no Recurrent headaches: no Visual changes: no Palpitations: no Dyspnea: no Chest pain: no Lower extremity edema: no Dizzy/lightheaded: no The ASCVD Risk score (Arnett DK, et al., 2019) failed to calculate for the following reasons:   The valid total cholesterol range is 130 to 320 mg/dL  Relevant past medical, surgical, family and social history reviewed and updated as indicated. Interim medical history since our last visit reviewed. Allergies and medications reviewed and updated.  Review of Systems  Constitutional:  Negative for activity change, diaphoresis, fatigue and fever.  Respiratory:  Negative for cough, chest tightness, shortness of breath and wheezing.   Cardiovascular:  Negative for chest pain, palpitations and leg swelling.  Gastrointestinal: Negative.   Endocrine: Negative for polydipsia, polyphagia and polyuria.  Neurological: Negative.   Psychiatric/Behavioral: Negative.      Per HPI unless specifically indicated above     Objective:    BP 131/76   Pulse 76   Temp 97.8 F (36.6 C) (Oral)   Ht 6' 0.52" (1.842 m)  Wt 170 lb 8 oz (77.3 kg)   SpO2 95%   BMI 22.79 kg/m   Wt Readings from Last 3 Encounters:  09/05/22 170 lb 8 oz (77.3 kg)  06/07/22 176 lb 3.2 oz (79.9 kg)  03/07/22 169 lb 9.6 oz (76.9 kg)    Physical Exam Vitals and nursing note reviewed.  Constitutional:      General: He is awake. He is not in acute distress.    Appearance: He is well-developed and well-groomed. He is not ill-appearing.  HENT:     Head: Normocephalic and atraumatic.     Right Ear: Hearing normal. No drainage.     Left Ear: Hearing normal. No drainage.  Eyes:     General: Lids are normal.        Right eye: No discharge.        Left eye: No discharge.     Conjunctiva/sclera: Conjunctivae normal.     Pupils: Pupils are equal, round, and reactive to light.  Neck:     Vascular: No carotid  bruit.  Cardiovascular:     Rate and Rhythm: Normal rate and regular rhythm.     Heart sounds: Normal heart sounds, S1 normal and S2 normal. No murmur heard.    No gallop.  Pulmonary:     Effort: Pulmonary effort is normal. No accessory muscle usage or respiratory distress.     Breath sounds: Normal breath sounds.  Abdominal:     General: Bowel sounds are normal.     Palpations: Abdomen is soft.  Musculoskeletal:        General: Normal range of motion.     Cervical back: Normal range of motion and neck supple.     Right lower leg: No edema.     Left lower leg: No edema.  Skin:    General: Skin is warm and dry.  Neurological:     Mental Status: He is alert and oriented to person, place, and time.  Psychiatric:        Attention and Perception: Attention normal.        Mood and Affect: Mood normal.        Speech: Speech normal.        Behavior: Behavior normal. Behavior is cooperative.        Thought Content: Thought content normal.     Results for orders placed or performed in visit on 06/07/22  Comprehensive metabolic panel  Result Value Ref Range   Glucose 77 70 - 99 mg/dL   BUN 18 8 - 27 mg/dL   Creatinine, Ser 1.61 0.76 - 1.27 mg/dL   eGFR 93 >09 UE/AVW/0.98   BUN/Creatinine Ratio 22 10 - 24   Sodium 141 134 - 144 mmol/L   Potassium 5.2 3.5 - 5.2 mmol/L   Chloride 102 96 - 106 mmol/L   CO2 22 20 - 29 mmol/L   Calcium 9.3 8.6 - 10.2 mg/dL   Total Protein 6.5 6.0 - 8.5 g/dL   Albumin 4.5 3.8 - 4.8 g/dL   Globulin, Total 2.0 1.5 - 4.5 g/dL   Albumin/Globulin Ratio 2.3 (H) 1.2 - 2.2   Bilirubin Total 0.6 0.0 - 1.2 mg/dL   Alkaline Phosphatase 94 44 - 121 IU/L   AST 14 0 - 40 IU/L   ALT 11 0 - 44 IU/L  Lipid Panel w/o Chol/HDL Ratio  Result Value Ref Range   Cholesterol, Total 124 100 - 199 mg/dL   Triglycerides 56 0 - 149 mg/dL   HDL  82 >39 mg/dL   VLDL Cholesterol Cal 13 5 - 40 mg/dL   LDL Chol Calc (NIH) 29 0 - 99 mg/dL  Microalbumin / creatinine urine ratio   Result Value Ref Range   Creatinine, Urine CANCELED mg/dL   Microalbumin, Urine CANCELED   Hemoglobin A1c  Result Value Ref Range   Hgb A1c MFr Bld 6.5 (H) 4.8 - 5.6 %   Est. average glucose Bld gHb Est-mCnc 140 mg/dL  Specimen status report  Result Value Ref Range   specimen status report Comment       Assessment & Plan:   Problem List Items Addressed This Visit       Cardiovascular and Mediastinum   Aortic atherosclerosis (HCC) (Chronic)    Chronic.  Noted on past imaging, continue statin and ASA daily for prevention.      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  Continue current medication regimen and collaboration with cardiology.      Relevant Orders   Basic metabolic panel   Hypertension associated with diabetes (HCC) (Chronic)    Chronic, stable.  BP remains at goal for age.  Continue current medication regimen and collaboration with cardiology.  Adjust as needed.  Recommend he continue to monitor BP occasionally at home and document + focus on DASH diet.  LABS: BMP.  Return in 3 months.       Relevant Orders   Basic metabolic panel   Bayer DCA Hb Z6X Waived     Endocrine   Hyperlipidemia associated with type 2 diabetes mellitus (HCC) (Chronic)    Chronic, stable.  Continue current medication regimen and adjust as needed.  Current LDL well controlled.  Lipid panel up to date.      Relevant Orders   Bayer DCA Hb A1c Waived   Type 2 diabetes mellitus with hyperglycemia (HCC) - Primary (Chronic)    Chronic, stable with A1c 6.5% today, remaining at goal.  Urine ALB 10 (January 2023), recheck next visit, it was not run in February when obtained.  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  Continue to monitor BS  at home and a post prandial check.  Return in 3 months.      Relevant Orders   Bayer DCA Hb A1c Waived     Other   Hyperkalemia (Chronic)    Chronic, stable.  Stable levels with Veltassa on board.  Continue current medication regimen -- full work-up performed in April 2022 and all WNL.  Consider referral to nephrology or endocrinology as needed.  Discussed with patient.      Relevant Orders   Basic metabolic panel     Follow up plan: Return in about 3 months (around 12/06/2022) for T2DM, HTN/HLD.

## 2022-09-06 LAB — BASIC METABOLIC PANEL
BUN/Creatinine Ratio: 20 (ref 10–24)
BUN: 17 mg/dL (ref 8–27)
CO2: 20 mmol/L (ref 20–29)
Calcium: 10.1 mg/dL (ref 8.6–10.2)
Chloride: 100 mmol/L (ref 96–106)
Creatinine, Ser: 0.85 mg/dL (ref 0.76–1.27)
Glucose: 70 mg/dL (ref 70–99)
Potassium: 5.2 mmol/L (ref 3.5–5.2)
Sodium: 140 mmol/L (ref 134–144)
eGFR: 91 mL/min/{1.73_m2} (ref >59)

## 2022-09-06 NOTE — Progress Notes (Signed)
Contacted via MyChart   Good evening Devaughn, your labs have returned and overall look fantastic!!  No changes needed!!  [employer]!! Keep being amazing!!  Thank you for allowing me to participate in your care.  I appreciate you. Kindest regards, Tacie Mccuistion

## 2022-10-12 ENCOUNTER — Other Ambulatory Visit: Payer: Self-pay

## 2022-10-12 MED ORDER — VELTASSA 8.4 G PO PACK: PACK | ORAL | 12 refills | Status: DC

## 2022-10-12 NOTE — Telephone Encounter (Signed)
Pharmacy requesting refill for Valtessa  Pow 8.4GM

## 2022-10-15 ENCOUNTER — Encounter: Payer: Self-pay | Admitting: Nurse Practitioner

## 2022-10-15 MED ORDER — VELTASSA 8.4 G PO PACK: PACK | ORAL | 12 refills | Status: DC

## 2022-10-23 ENCOUNTER — Telehealth: Payer: Self-pay | Admitting: *Deleted

## 2022-10-23 NOTE — Patient Outreach (Signed)
  Care Coordination   Follow Up Visit Note   10/23/2022 Name: Cleophas Yoak MRN: [id number] DOB: [birth year]  Darrelle Wiberg is a 77 y.o. year old male who sees Cannady, Corrie Dandy T, NP for primary care. I spoke with  Lonia Blood by phone today.  What matters to the patients health and wellness today?  State he is doing well, denies needs at this time and denies need for further calls. Agrees to receive [facility] brochure for future needs.       SDOH assessments and interventions completed:  No     Care Coordination Interventions:  No, not indicated   Follow up plan: No further intervention required.   Encounter Outcome:  Pt. Refused   Kemper Durie, RN, MSN, [facility] Care Management Care Management Coordinator [phone]

## 2022-11-05 ENCOUNTER — Other Ambulatory Visit: Payer: Self-pay | Admitting: Nurse Practitioner

## 2022-11-05 NOTE — Telephone Encounter (Signed)
Requested Prescriptions  Pending Prescriptions Disp Refills   metFORMIN (GLUCOPHAGE) 1000 MG tablet [Pharmacy Med Name: METFORMIN HCL 1000 MG TAB] 180 tablet 1    Sig: TAKE 1 TABLET BY MOUTH TWICE DAILY WITH MEALS.     Endocrinology:  Diabetes - Biguanides Failed - 11/05/2022 10:22 AM      Failed - B12 Level in normal range and within 720 days    Vitamin B-12  Date Value Ref Range Status  06/25/2019 1,921 (H) 232 - 1,245 pg/mL Final         Passed - Cr in normal range and within 360 days    Creatinine, Ser  Date Value Ref Range Status  09/05/2022 0.85 0.76 - 1.27 mg/dL Final         Passed - HBA1C is between 0 and 7.9 and within 180 days    HB A1C (BAYER DCA - WAIVED)  Date Value Ref Range Status  09/05/2022 6.5 (H) 4.8 - 5.6 % Final    Comment:             Prediabetes: 5.7 - 6.4          Diabetes: >6.4          Glycemic control for adults with diabetes: <7.0          Passed - eGFR in normal range and within 360 days    GFR calc Af Amer  Date Value Ref Range Status  06/16/2020 103 >59 mL/min/1.73 Final    Comment:    **In accordance with recommendations from the NKF-ASN Task force,**   [facility] is in the process of updating its eGFR calculation to the   2021 CKD-EPI creatinine equation that estimates kidney function   without a race variable.    GFR calc non Af Amer  Date Value Ref Range Status  06/16/2020 89 >59 mL/min/1.73 Final   eGFR  Date Value Ref Range Status  09/05/2022 91 >59 mL/min/1.73 Final         Passed - Valid encounter within last 6 months    Recent Outpatient Visits           2 months ago Type 2 diabetes mellitus with hyperglycemia, with long-term current use of insulin (HCC)   [place] [facility] Industry, Halstad T, NP   5 months ago Medicare annual wellness visit, subsequent   [place] [facility] Gillett, Wadley T, NP   8 months ago Type 2 diabetes mellitus with hyperglycemia, with long-term current use of  insulin (HCC)   [facility] Jackson, [place] T, NP   11 months ago Type 2 diabetes mellitus with hyperglycemia, with long-term current use of insulin (HCC)   [place] [facility] [place], Inglis T, NP   1 year ago Type 2 diabetes mellitus with hyperglycemia, with long-term current use of insulin (HCC)   [place] [facility] McGregor, Corrie Dandy T, NP       Future Appointments             In 1 month Ed[place]ouch, [place] T, NP [place] [facility]            Passed - CBC within normal limits and completed in the last 12 months    WBC  Date Value Ref Range Status  04/05/2022 6.0 3.4 - 10.8 x10E3/uL Final  06/18/2019 10.2 4.0 - 10.5 K/uL Final   RBC  Date Value Ref Range Status  04/05/2022 4.00 (L) 4.14 -  5.80 x10E6/uL Final  06/18/2019 4.24 4.22 - 5.81 MIL/uL Final   Hemoglobin  Date Value Ref Range Status  04/05/2022 12.9 (L) 13.0 - 17.7 g/dL Final   Hematocrit  Date Value Ref Range Status  04/05/2022 39.5 37.5 - 51.0 % Final   MCHC  Date Value Ref Range Status  04/05/2022 32.7 31.5 - 35.7 g/dL Final  16/01/9603 54.0 30.0 - 36.0 g/dL Final   [facility]  Date Value Ref Range Status  04/05/2022 32.3 26.6 - 33.0 pg Final  06/18/2019 31.6 26.0 - 34.0 pg Final   MCV  Date Value Ref Range Status  04/05/2022 99 (H) 79 - 97 fL Final   No results found for: "PLTCOUNTKUC", "LABPLAT", "POCPLA" RDW  Date Value Ref Range Status  04/05/2022 12.9 11.6 - 15.4 % Final

## 2022-12-06 ENCOUNTER — Ambulatory Visit: Payer: Medicare HMO | Admitting: Nurse Practitioner

## 2022-12-06 DIAGNOSIS — E1165 Type 2 diabetes mellitus with hyperglycemia: Secondary | ICD-10-CM

## 2022-12-06 DIAGNOSIS — I700 Atherosclerosis of aorta: Secondary | ICD-10-CM

## 2022-12-06 DIAGNOSIS — I25119 Atherosclerotic heart disease of native coronary artery with unspecified angina pectoris: Secondary | ICD-10-CM

## 2022-12-06 DIAGNOSIS — E1159 Type 2 diabetes mellitus with other circulatory complications: Secondary | ICD-10-CM

## 2022-12-06 DIAGNOSIS — E875 Hyperkalemia: Secondary | ICD-10-CM

## 2022-12-06 DIAGNOSIS — E785 Hyperlipidemia, unspecified: Secondary | ICD-10-CM

## 2022-12-06 NOTE — Patient Instructions (Signed)
Be Involved in Caring For Your Health:  Taking Medications When medications are taken as directed, they can greatly improve your health. But if they are not taken as prescribed, they may not work. In some cases, not taking them correctly can be harmful. To help ensure your treatment remains effective and safe, understand your medications and how to take them. Bring your medications to each visit for review by your provider.  Your lab results, notes, and after visit summary will be available on My Chart. We strongly encourage you to use this feature. If lab results are abnormal the clinic will contact you with the appropriate steps. If the clinic does not contact you assume the results are satisfactory. You can always view your results on My Chart. If you have questions regarding your health or results, please contact the clinic during office hours. You can also ask questions on My Chart.  We at [facility] are grateful that you chose us to provide your care. We strive to provide evidence-based and compassionate care and are always looking for feedback. If you get a survey from the clinic please complete this so we can hear your opinions.  Diabetes Mellitus Basics  Diabetes mellitus, or diabetes, is a long-term (chronic) disease. It occurs when the body does not properly use sugar (glucose) that is released from food after you eat. Diabetes mellitus may be caused by one or both of these problems: Your pancreas does not make enough of a hormone called insulin. Your body does not react in a normal way to the insulin that it makes. Insulin lets glucose enter cells in your body. This gives you energy. If you have diabetes, glucose cannot get into cells. This causes high blood glucose (hyperglycemia). How to treat and manage diabetes You may need to take insulin or other diabetes medicines daily to keep your glucose in balance. If you are prescribed insulin, you will learn how to give  yourself insulin by injection. You may need to adjust the amount of insulin you take based on the foods that you eat. You will need to check your blood glucose levels using a glucose monitor as told by your health care provider. The readings can help determine if you have low or high blood glucose. Generally, you should have these blood glucose levels: Before meals (preprandial): 80-130 mg/dL (4.4-7.2 mmol/L). After meals (postprandial): below 180 mg/dL (10 mmol/L). Hemoglobin A1c (HbA1c) level: less than 7%. Your health care provider will set treatment goals for you. Keep all follow-up visits. This is important. Follow these instructions at home: Diabetes medicines Take your diabetes medicines every day as told by your health care provider. List your diabetes medicines here: Name of medicine: ______________________________ Amount (dose): _______________ Time (a.m./p.m.): _______________ Notes: ___________________________________ Name of medicine: ______________________________ Amount (dose): _______________ Time (a.m./p.m.): _______________ Notes: ___________________________________ Name of medicine: ______________________________ Amount (dose): _______________ Time (a.m./p.m.): _______________ Notes: ___________________________________ Insulin If you use insulin, list the types of insulin you use here: Insulin type: ______________________________ Amount (dose): _______________ Time (a.m./p.m.): _______________Notes: ___________________________________ Insulin type: ______________________________ Amount (dose): _______________ Time (a.m./p.m.): _______________ Notes: ___________________________________ Insulin type: ______________________________ Amount (dose): _______________ Time (a.m./p.m.): _______________ Notes: ___________________________________ Insulin type: ______________________________ Amount (dose): _______________ Time (a.m./p.m.): _______________ Notes:  ___________________________________ Insulin type: ______________________________ Amount (dose): _______________ Time (a.m./p.m.): _______________ Notes: ___________________________________ Managing blood glucose  Check your blood glucose levels using a glucose monitor as told by your health care provider. Write down the times that you check your glucose levels here: Time: _______________ Notes: ___________________________________   Time: _______________ Notes: ___________________________________ Time: _______________ Notes: ___________________________________ Time: _______________ Notes: ___________________________________ Time: _______________ Notes: ___________________________________ Time: _______________ Notes: ___________________________________  Low blood glucose Low blood glucose (hypoglycemia) is when glucose is at or below 70 mg/dL (3.9 mmol/L). Symptoms may include: Feeling: Hungry. Sweaty and clammy. Irritable or easily upset. Dizzy. Sleepy. Having: A fast heartbeat. A headache. A change in your vision. Numbness around the mouth, lips, or tongue. Having trouble with: Moving (coordination). Sleeping. Treating low blood glucose To treat low blood glucose, eat or drink something containing sugar right away. If you can think clearly and swallow safely, follow the 15:15 rule: Take 15 grams of a fast-acting carb (carbohydrate), as told by your health care provider. Some fast-acting carbs are: Glucose tablets: take 3-4 tablets. Hard candy: eat 3-5 pieces. Fruit juice: drink 4 oz (120 mL). Regular (not diet) soda: drink 4-6 oz (120-180 mL). Honey or sugar: eat 1 Tbsp (15 mL). Check your blood glucose levels 15 minutes after you take the carb. If your glucose is still at or below 70 mg/dL (3.9 mmol/L), take 15 grams of a carb again. If your glucose does not go above 70 mg/dL (3.9 mmol/L) after 3 tries, get help right away. After your glucose goes back to normal, eat a meal  or a snack within 1 hour. Treating very low blood glucose If your glucose is at or below 54 mg/dL (3 mmol/L), you have very low blood glucose (severe hypoglycemia). This is an emergency. Do not wait to see if the symptoms will go away. Get medical help right away. Call your local emergency services (911 in the [place] Do not drive yourself to the hospital. Questions to ask your health care provider Should I talk with a diabetes educator? What equipment will I need to care for myself at home? What diabetes medicines do I need? When should I take them? How often do I need to check my blood glucose levels? What number can I call if I have questions? When is my follow-up visit? Where can I find a support group for people with diabetes? Where to find more information American Diabetes Association: www.diabetes.org Association of Diabetes Care and Education Specialists: www.diabeteseducator.org Contact a health care provider if: Your blood glucose is at or above 240 mg/dL (13.3 mmol/L) for 2 days in a row. You have been sick or have had a fever for 2 days or more, and you are not getting better. You have any of these problems for more than 6 hours: You cannot eat or drink. You feel nauseous. You vomit. You have diarrhea. Get help right away if: Your blood glucose is lower than 54 mg/dL (3 mmol/L). You get confused. You have trouble thinking clearly. You have trouble breathing. These symptoms may represent a serious problem that is an emergency. Do not wait to see if the symptoms will go away. Get medical help right away. Call your local emergency services (911 in the [place] Do not drive yourself to the hospital. Summary Diabetes mellitus is a chronic disease that occurs when the body does not properly use sugar (glucose) that is released from food after you eat. Take insulin and diabetes medicines as told. Check your blood glucose every day, as often as told. Keep all follow-up visits. This  is important. This information is not intended to replace advice given to you by your health care provider. Make sure you discuss any questions you have with your health care provider. Document Revised: 08/18/2019 Document Reviewed: 08/18/2019 Elsevier Patient Education    2024 Elsevier Inc.  

## 2022-12-07 ENCOUNTER — Encounter: Payer: Self-pay | Admitting: Nurse Practitioner

## 2022-12-07 ENCOUNTER — Ambulatory Visit (INDEPENDENT_AMBULATORY_CARE_PROVIDER_SITE_OTHER): Payer: Medicare HMO | Admitting: Nurse Practitioner

## 2022-12-07 VITALS — BP 128/65 | HR 67 | Temp 98.1°F | Wt 169.8 lb

## 2022-12-07 DIAGNOSIS — Z794 Long term (current) use of insulin: Secondary | ICD-10-CM

## 2022-12-07 DIAGNOSIS — E1169 Type 2 diabetes mellitus with other specified complication: Secondary | ICD-10-CM | POA: Diagnosis not present

## 2022-12-07 DIAGNOSIS — E875 Hyperkalemia: Secondary | ICD-10-CM

## 2022-12-07 DIAGNOSIS — I152 Hypertension secondary to endocrine disorders: Secondary | ICD-10-CM

## 2022-12-07 DIAGNOSIS — E785 Hyperlipidemia, unspecified: Secondary | ICD-10-CM

## 2022-12-07 DIAGNOSIS — E1165 Type 2 diabetes mellitus with hyperglycemia: Secondary | ICD-10-CM

## 2022-12-07 DIAGNOSIS — E1159 Type 2 diabetes mellitus with other circulatory complications: Secondary | ICD-10-CM | POA: Diagnosis not present

## 2022-12-07 DIAGNOSIS — I25119 Atherosclerotic heart disease of native coronary artery with unspecified angina pectoris: Secondary | ICD-10-CM

## 2022-12-07 LAB — BAYER DCA HB A1C WAIVED: HB A1C (BAYER DCA - WAIVED): 6.5 % — ABNORMAL HIGH (ref 4.8–5.6)

## 2022-12-07 LAB — MICROALBUMIN, URINE WAIVED
Creatinine, Urine Waived: 200 mg/dL (ref 10–300)
Microalb, Ur Waived: 80 mg/L — ABNORMAL HIGH (ref 0–19)

## 2022-12-07 NOTE — Assessment & Plan Note (Signed)
Chronic, stable.  Stable levels with Veltassa on board.  Continue current medication regimen -- full work-up performed in April 2022 and all WNL.  Consider referral to nephrology or endocrinology as needed.  Discussed with patient. 

## 2022-12-07 NOTE — Progress Notes (Addendum)
BP 128/65   Pulse 67   Temp 98.1 F (36.7 C) (Oral)   Wt 169 lb 12.8 oz (77 kg)   SpO2 97%   BMI 22.70 kg/m    Subjective:    Patient ID: Johnny Wolfe, male    DOB: 1945-09-05, 77 y.o.   MRN: [id number]  HPI: Johnny Wolfe is a 77 y.o. male  Chief Complaint  Patient presents with   Diabetes   Hypertension   Hyperlipidemia   DIABETES A1c May 6.5%.  Continues on Metformin 1000 MG BID and Lantus 16 units. Tried GLP1 and SGLT2, but these were not beneficial -- he lost weight and felt increased fatigue with these medications.    History: Glutamic acid <5.0 and C peptide 2.1 in 2021 on check -- ?Type 1 1/2 vs Type 2. Hypoglycemic episodes: x 2 over past few months Polydipsia/polyuria: no Visual disturbance: no Chest pain: no Paresthesias: no Glucose Monitoring: yes             Accucheck frequency: Daily             Fasting glucose: on average 100 to 110 range             Post prandial:              Evening:             Before meals: Taking Insulin?: no             Long acting insulin: Lantus 16 units             Short acting insulin: Blood Pressure Monitoring: daily Retinal Examination: Up to Date Foot Exam: Up to Date Pneumovax: Up to Date Influenza: Up to Date Aspirin: yes   HYPERTENSION / HYPERLIPIDEMIA Continues on ASA and Metoprolol + Lipitor. Saw cardiology 02/05/22, returns annually.  He has history of cardiac cath with stent to proximal RCA in April 2019.  Continues on Veltassa for hyperkalemia --- work-up showed WNL labs and recent K+ stable.    Has never used NTG.   Satisfied with current treatment? yes Duration of hypertension: chronic BP monitoring frequency: not checking BP range: not checking BP medication side effects: no Duration of hyperlipidemia: chronic Cholesterol medication side effects: no Cholesterol supplements: none Medication compliance: good compliance Aspirin: yes Recent stressors: no Recurrent headaches: no Visual changes:  no Palpitations: no Dyspnea: no Chest pain: no Lower extremity edema: no Dizzy/lightheaded: no The ASCVD Risk score (Arnett DK, et al., 2019) failed to calculate for the following reasons:   The valid total cholesterol range is 130 to 320 mg/dL  Relevant past medical, surgical, family and social history reviewed and updated as indicated. Interim medical history since our last visit reviewed. Allergies and medications reviewed and updated.  Review of Systems  Constitutional:  Negative for activity change, diaphoresis, fatigue and fever.  Respiratory:  Negative for cough, chest tightness, shortness of breath and wheezing.   Cardiovascular:  Negative for chest pain, palpitations and leg swelling.  Gastrointestinal: Negative.   Endocrine: Negative for polydipsia, polyphagia and polyuria.  Neurological: Negative.   Psychiatric/Behavioral: Negative.      Per HPI unless specifically indicated above     Objective:    BP 128/65   Pulse 67   Temp 98.1 F (36.7 C) (Oral)   Wt 169 lb 12.8 oz (77 kg)   SpO2 97%   BMI 22.70 kg/m   Wt Readings from Last 3 Encounters:  12/07/22 169 lb 12.8 oz (77 kg)  09/05/22 170 lb 8 oz (77.3 kg)  06/07/22 176 lb 3.2 oz (79.9 kg)    Physical Exam Vitals and nursing note reviewed.  Constitutional:      General: He is awake. He is not in acute distress.    Appearance: He is well-developed and well-groomed. He is not ill-appearing.  HENT:     Head: Normocephalic and atraumatic.     Right Ear: Hearing normal. No drainage.     Left Ear: Hearing normal. No drainage.  Eyes:     General: Lids are normal.        Right eye: No discharge.        Left eye: No discharge.     Conjunctiva/sclera: Conjunctivae normal.     Pupils: Pupils are equal, round, and reactive to light.  Neck:     Vascular: No carotid bruit.  Cardiovascular:     Rate and Rhythm: Normal rate and regular rhythm.     Heart sounds: Normal heart sounds, S1 normal and S2 normal. No  murmur heard.    No gallop.  Pulmonary:     Effort: Pulmonary effort is normal. No accessory muscle usage or respiratory distress.     Breath sounds: Normal breath sounds.  Abdominal:     General: Bowel sounds are normal.     Palpations: Abdomen is soft.  Musculoskeletal:        General: Normal range of motion.     Cervical back: Normal range of motion and neck supple.     Right lower leg: No edema.     Left lower leg: No edema.  Skin:    General: Skin is warm and dry.  Neurological:     Mental Status: He is alert and oriented to person, place, and time.  Psychiatric:        Attention and Perception: Attention normal.        Mood and Affect: Mood normal.        Speech: Speech normal.        Behavior: Behavior normal. Behavior is cooperative.        Thought Content: Thought content normal.    Diabetic Foot Exam - Simple   Simple Foot Form Visual Inspection No deformities, no ulcerations, no other skin breakdown bilaterally: Yes Sensation Testing See comments: Yes Pulse Check See comments: Yes Comments Overall stable foot exam -- extremely sensitive to sensation testing 10/10 both sides.  Small scab to upper right second toe from cutting area, is healing.      Results for orders placed or performed in visit on 09/05/22  Basic metabolic panel  Result Value Ref Range   Glucose 70 70 - 99 mg/dL   BUN 17 8 - 27 mg/dL   Creatinine, Ser 1.61 0.76 - 1.27 mg/dL   eGFR 91 >09 UE/AVW/0.98   BUN/Creatinine Ratio 20 10 - 24   Sodium 140 134 - 144 mmol/L   Potassium 5.2 3.5 - 5.2 mmol/L   Chloride 100 96 - 106 mmol/L   CO2 20 20 - 29 mmol/L   Calcium 10.1 8.6 - 10.2 mg/dL  Bayer DCA Hb J1B Waived  Result Value Ref Range   HB A1C (BAYER DCA - WAIVED) 6.5 (H) 4.8 - 5.6 %      Assessment & Plan:   Problem List Items Addressed This Visit       Cardiovascular and Mediastinum   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  Continue current medication regimen and collaboration with cardiology.      Relevant Orders   Lipid Panel w/o Chol/HDL Ratio   Hypertension associated with diabetes (HCC) (Chronic)    Chronic, stable.  BP remains at goal for age.  Continue current medication regimen and collaboration with cardiology.  Adjust as needed.  Recommend he continue to monitor BP occasionally at home and document + focus on DASH diet.  LABS: CMP and urine ALB.  Return in 3 months.       Relevant Orders   Bayer DCA Hb A1c Waived   Comprehensive metabolic panel   Microalbumin, Urine Waived     Endocrine   Hyperlipidemia associated with type 2 diabetes mellitus (HCC) (Chronic)    Chronic, stable.  Continue current medication regimen and adjust as needed.  Current LDL well controlled.  Lipid panel today.      Relevant Orders   Bayer DCA Hb A1c Waived   Comprehensive metabolic panel   Lipid Panel w/o Chol/HDL Ratio   Type 2 diabetes mellitus with hyperglycemia (HCC) - Primary (Chronic)    Chronic, stable with A1c 6.5% last visit, will recheck today, remaining at goal.  Urine ALB 10 (January 2023), recheck today, it was not run in February when obtained.  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  Continue to monitor BS at home and a post prandial check.  Return in 3 months.      Relevant Orders   Bayer DCA Hb A1c Waived   Microalbumin, Urine Waived     Other   Hyperkalemia (Chronic)    Chronic, stable.  Stable levels with Veltassa on board.  Continue current medication regimen -- full work-up performed in April 2022 and all WNL.  Consider referral to nephrology or endocrinology as needed.  Discussed with patient.      Relevant Orders   Comprehensive metabolic panel     Follow up plan: Return in about 3  months (around 03/09/2023) for Annual physical after 03/08/23.

## 2022-12-07 NOTE — Assessment & Plan Note (Signed)
Chronic, stable.  BP remains at goal for age.  Continue current medication regimen and collaboration with cardiology.  Adjust as needed.  Recommend he continue to monitor BP occasionally at home and document + focus on DASH diet.  LABS: CMP and urine ALB.  Return in 3 months.

## 2022-12-07 NOTE — Assessment & Plan Note (Signed)
Chronic, stable.  Continue current medication regimen and adjust as needed.  Current LDL well controlled.  Lipid panel today.

## 2022-12-07 NOTE — Assessment & Plan Note (Signed)
Chronic, stable with no recent NTG use.  Continue current medication regimen and collaboration with cardiology. 

## 2022-12-07 NOTE — Assessment & Plan Note (Signed)
Chronic, stable with A1c 6.5% last visit, will recheck today, remaining at goal.  Urine ALB 10 (January 2023), recheck today, it was not run in February when obtained.  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  Continue to monitor BS at home and a post prandial check.  Return in 3 months.

## 2022-12-08 ENCOUNTER — Other Ambulatory Visit: Payer: Self-pay | Admitting: Nurse Practitioner

## 2022-12-08 DIAGNOSIS — E875 Hyperkalemia: Secondary | ICD-10-CM

## 2022-12-08 NOTE — Progress Notes (Signed)
Contacted via MyChart -- lab only visit in 2 weeks please   Good morning [name], your labs have returned: - Cholesterol levels remain at goal, no medication changes needed. - Your potassium has trended up again, have you been eating more higher potassium foods?  Taking Veltassa daily?  Let me know.  I would like to recheck level in 2 weeks outpatient.  Focus heavily on diet.  If continues to be elevated, I may send you to nephrology for further assessment.  Any questions? Keep being amazing!!  Thank you for allowing me to participate in your care.  I appreciate you. Kindest regards, Johnny Wolfe

## 2022-12-24 ENCOUNTER — Other Ambulatory Visit: Payer: Self-pay | Admitting: Nurse Practitioner

## 2022-12-24 ENCOUNTER — Other Ambulatory Visit: Payer: Medicare HMO

## 2022-12-24 DIAGNOSIS — E875 Hyperkalemia: Secondary | ICD-10-CM | POA: Diagnosis not present

## 2022-12-24 DIAGNOSIS — Z794 Long term (current) use of insulin: Secondary | ICD-10-CM

## 2022-12-25 LAB — BASIC METABOLIC PANEL
BUN/Creatinine Ratio: 18 (ref 10–24)
BUN: 16 mg/dL (ref 8–27)
CO2: 26 mmol/L (ref 20–29)
Calcium: 9.7 mg/dL (ref 8.6–10.2)
Chloride: 100 mmol/L (ref 96–106)
Creatinine, Ser: 0.9 mg/dL (ref 0.76–1.27)
Glucose: 115 mg/dL — ABNORMAL HIGH (ref 70–99)
Potassium: 5.1 mmol/L (ref 3.5–5.2)
Sodium: 139 mmol/L (ref 134–144)
eGFR: 89 mL/min/{1.73_m2} (ref >59)

## 2022-12-25 NOTE — Progress Notes (Signed)
Contacted via MyChart   Good morning Johnny Wolfe, your labs have returned and potassium is back in normal range, continue to focus on diet and continue Veltassa.  Any questions? Keep being amazing!!  Thank you for allowing me to participate in your care.  I appreciate you. Kindest regards, Tamora Huneke

## 2022-12-25 NOTE — Telephone Encounter (Signed)
Requested Prescriptions  Pending Prescriptions Disp Refills   ONETOUCH VERIO test strip [Pharmacy Med Name: ONETOUCH VERIO STRIP] 100 each 0    Sig: USE TO CHECK BLOOD SUGAR 2-3 TIMES PER DAY AS DIRECTED     Endocrinology: Diabetes - Testing Supplies Passed - 12/24/2022 12:25 PM      Passed - Valid encounter within last 12 months    Recent Outpatient Visits           2 weeks ago Type 2 diabetes mellitus with hyperglycemia, with long-term current use of insulin (HCC)   [place] [facility] [place], [place] T, NP   3 months ago Type 2 diabetes mellitus with hyperglycemia, with long-term current use of insulin (HCC)   [place] [facility] [place], [place] T, NP   6 months ago Medicare annual wellness visit, subsequent   [place] [facility] [place], [place] T, NP   9 months ago Type 2 diabetes mellitus with hyperglycemia, with long-term current use of insulin (HCC)   [place] [facility] [place], [place] T, NP   1 year ago Type 2 diabetes mellitus with hyperglycemia, with long-term current use of insulin (HCC)   [place] [facility] [place], Dorie Rank, NP       Future Appointments             In 1 month Gollan, Tollie Pizza, MD  [facility] [place]   In 2 months [place], Dorie Rank, NP  [facility]

## 2023-02-08 ENCOUNTER — Other Ambulatory Visit: Payer: Self-pay | Admitting: Cardiovascular Disease

## 2023-02-08 ENCOUNTER — Other Ambulatory Visit: Payer: Self-pay | Admitting: Nurse Practitioner

## 2023-02-08 DIAGNOSIS — E1165 Type 2 diabetes mellitus with hyperglycemia: Secondary | ICD-10-CM

## 2023-02-08 DIAGNOSIS — I25119 Atherosclerotic heart disease of native coronary artery with unspecified angina pectoris: Secondary | ICD-10-CM

## 2023-02-08 NOTE — Telephone Encounter (Signed)
Requested Prescriptions  Pending Prescriptions Disp Refills   ONETOUCH VERIO test strip [Pharmacy Med Name: ONETOUCH VERIO STRIP] 100 each 0    Sig: USE TO CHECK BLOOD SUGAR 2-3 TIMES PER DAY AS DIRECTED     Endocrinology: Diabetes - Testing Supplies Passed - 02/08/2023 10:21 AM      Passed - Valid encounter within last 12 months    Recent Outpatient Visits           2 months ago Type 2 diabetes mellitus with hyperglycemia, with long-term current use of insulin (HCC)   [place] [facility] S[place], Mott T, NP   5 months ago Type 2 diabetes mellitus with hyperglycemia, with long-term current use of insulin (HCC)   [place] [facility] [place], Corrie Dandy T, NP   8 months ago Medicare annual wellness visit, subsequent   [place] [facility] [place], [place] T, NP   11 months ago Type 2 diabetes mellitus with hyperglycemia, with long-term current use of insulin (HCC)   [place] [facility] [place], Byron T, NP   1 year ago Type 2 diabetes mellitus with hyperglycemia, with long-term current use of insulin (HCC)   [place] [facility] [place], Corrie Dandy T, NP       Future Appointments             In 1 week Gollan, Tollie Pizza, MD [facility] [place]   In 1 month [place]al, Dorie Rank, NP [facility]             Lancets (ONETOUCH DELICA PLUS [place]) MISC [Pharmacy Med Name: Dola [place] PLUS LANCET33G] 100 each 4    Sig: USE TO CHECK BLOOD SUGAR 2-3 TIMES DAILY     Endocrinology: Diabetes - Testing Supplies Passed - 02/08/2023 10:21 AM      Passed - Valid encounter within last 12 months    Recent Outpatient Visits           2 months ago Type 2 diabetes mellitus with hyperglycemia, with long-term current use of insulin (HCC)   [facility] [place], [place] T, NP   5 months ago Type 2 diabetes mellitus with hyperglycemia, with long-term  current use of insulin (HCC)   [place] [facility] [place], Segundo T, NP   8 months ago Medicare annual wellness visit, subsequent   [facility] [place], Newell T, NP   11 months ago Type 2 diabetes mellitus with hyperglycemia, with long-term current use of insulin (HCC)   [facility] [place], [place] T, NP   1 year ago Type 2 diabetes mellitus with hyperglycemia, with long-term current use of insulin (HCC)   [place] [facility] [place], Corrie Dandy T, NP       Future Appointments             In 1 week Gollan, Tollie Pizza, MD [facility] [place]   In 1 month [place], Dorie Rank, NP  [facility]             metFORMIN (GLUCOPHAGE) 1000 MG tablet [Pharmacy Med Name: METFORMIN HCL 1000 MG TAB] 180 tablet 1    Sig: TAKE 1 TABLET BY MOUTH TWICE DAILY WITH MEALS.     Endocrinology:  Diabetes - Biguanides Failed - 02/08/2023 10:21 AM      Failed - B12 Level in normal range and within 720 days  Vitamin B-12  Date Value Ref Range Status  06/25/2019 1,921 (H) 232 - 1,245 pg/mL Final         Passed - Cr in normal range and within 360 days    Creatinine, Ser  Date Value Ref Range Status  12/24/2022 0.90 0.76 - 1.27 mg/dL Final         Passed - HBA1C is between 0 and 7.9 and within 180 days    HB A1C (BAYER DCA - WAIVED)  Date Value Ref Range Status  12/07/2022 6.5 (H) 4.8 - 5.6 % Final    Comment:             Prediabetes: 5.7 - 6.4          Diabetes: >6.4          Glycemic control for adults with diabetes: <7.0          Passed - eGFR in normal range and within 360 days    GFR calc Af Amer  Date Value Ref Range Status  06/16/2020 103 >59 mL/min/1.73 Final    Comment:    **In accordance with recommendations from the NKF-ASN Task force,**   [facility] is in the process of updating its eGFR calculation to the   2021 CKD-EPI creatinine equation that estimates  kidney function   without a race variable.    GFR calc non Af Amer  Date Value Ref Range Status  06/16/2020 89 >59 mL/min/1.73 Final   eGFR  Date Value Ref Range Status  12/24/2022 89 >59 mL/min/1.73 Final         Passed - Valid encounter within last 6 months    Recent Outpatient Visits           2 months ago Type 2 diabetes mellitus with hyperglycemia, with long-term current use of insulin (HCC)   [facility] [place], Platinum T, NP   5 months ago Type 2 diabetes mellitus with hyperglycemia, with long-term current use of insulin (HCC)   [place] [facility] [place], Corrie Dandy T, NP   8 months ago Medicare annual wellness visit, subsequent   [facility] [place], [place] T, NP   11 months ago Type 2 diabetes mellitus with hyperglycemia, with long-term current use of insulin (HCC)   [facility] [facility] [place], [place] T, NP   1 year ago Type 2 diabetes mellitus with hyperglycemia, with long-term current use of insulin (HCC)   [facility] [place], Corrie Dandy T, NP       Future Appointments             In 1 week Gollan, Tollie Pizza, MD [facility] [place]   In 1 month [place], [place] T, NP [place] [facility]            Passed - CBC within normal limits and completed in the last 12 months    WBC  Date Value Ref Range Status  04/05/2022 6.0 3.4 - 10.8 x10E3/uL Final  06/18/2019 10.2 4.0 - 10.5 K/uL Final   RBC  Date Value Ref Range Status  04/05/2022 4.00 (L) 4.14 - 5.80 x10E6/uL Final  06/18/2019 4.24 4.22 - 5.81 MIL/uL Final   Hemoglobin  Date Value Ref Range Status  04/05/2022 12.9 (L) 13.0 - 17.7 g/dL Final   Hematocrit  Date Value Ref Range Status  04/05/2022 39.5 37.5 - 51.0 % Final   MCHC  Date Value Ref Range Status  04/05/2022 32.7 31.5 - 35.7 g/dL Final  16/01/9603 54.0 30.0 - 36.0 g/dL Final   [facility]  Date Value Ref  Range Status  04/05/2022 32.3 26.6 - 33.0 pg Final  06/18/2019 31.6 26.0 - 34.0 pg Final   MCV  Date Value Ref Range Status  04/05/2022 99 (H) 79 - 97 fL Final   No results found for: "PLTCOUNTKUC", "LABPLAT", "POCPLA" RDW  Date Value Ref Range Status  04/05/2022 12.9 11.6 - 15.4 % Final

## 2023-02-14 NOTE — Progress Notes (Signed)
Cardiology Office Note  Date:  02/15/2023   ID:  Johnny Wolfe, DOB [birth year], MRN [id number]  PCP:  Marjie Skiff, NP   Chief Complaint  Patient presents with   Follow-up    Patient denies new or acute cardiac problems/concerns today.      HPI:  Johnny Wolfe is a 77 yo old gentleman with past medical history of Diabetes,  Hyperkalemia on valtessa Hypertension Hyperlipidemia Smoker quit 2013 ETOH Coronary calcium score of 2310. Cardiac cath 08/02/2017 Severe proximal RCA disease, with stent placement Moderate proximal LAD and LCX disease Mild mid LAD, RCA disease, Vessels are heavily calcified. Prior history of syncope Who presents for follow-up of his coronary disease, stent to the RCA  Last office visit with myself October 2023 In follow-up today reports feeling well Denies chest pain or shortness of breath concerning for angina  No regular exercise program  No near-syncope or syncope episodes  Travel to [place] over the summer for family reunion Periodic travel to [place]  Lab work reviewed A1c 6.5 Total cholesterol 132 LDL 48 Potassium 5.1 continues on [place]  EKG personally reviewed by myself on todays visit EKG Interpretation Date/Time:  Friday February 15 2023 10:30:51 EDT Ventricular Rate:  62 PR Interval:  184 QRS Duration:  86 QT Interval:  418 QTC Calculation: 424 R Axis:   84  Text Interpretation: Normal sinus rhythm with sinus arrhythmia Normal ECG When compared with ECG of 25-Jun-2019 15:05, Vent. rate has decreased BY  56 BPM Right bundle branch block is no longer Present Confirmed by Julien Nordmann [phone]) on 02/15/2023 10:55:55 AM    Other past medical history reviewed Echo at [facility] The left ventricle is normal in size with normal wall thickness. The left ventricular systolic function is normal, LVEF is visually estimated at > 55%. The right ventricle is normal in size, with normal systolic function.  Recent trip to [place], some  hypersensitive to light Weight back up 10 pounds, Prior visit, had lost 20 pounds Less off orthostasis Has had med changes, off jardiance/ozympic  Fall, seen in the ER 05/12/2019  tripped on part of his rug, falling and striking the right side of his face on the floor. facial fracture requiring ORIF by Dr. Baldomero Lamy with ENT at [facility].   FAll, 06/18/2019, in the ER   syncopal episode at home which resulted in an injury to his chin and jaw got up tonight and felt dizzy and is not quite certain what happened but woke up on the floor with an odd and "full" feeling in his right ear and some bleeding under his chin. Transferred to [facility] Has IVF 2 1/2 days  Cardiac cath 08/02/2017 Severe proximal RCA disease, with stent placement Moderate proximal LAD and LCX disease Mild mid LAD, RCA disease, Vessels are heavily calcified. Normal EF estimated at 55%  Coronary calcium score of 2310. This was 95 percentile for age and sex matched control. high calcium score.  severe heavy three-vessel disease  mild aortic atherosclerosis  Syncope: 05/21/2017 On [employer] to bed 7pm Woke up on the floor Remembered went to bathroom, woke up on floor, dark in room Then went to bathroom again,  Feeling for light Woke up again in the bathtub, little knot on head, Got up out of bathroom,  Little dizzy,  Sugars were 45  Father died 12s cancer Mother Obese, DM, cad, stent age 41, now age 67, now pacer Brother CAD, PCi  PMH:   has a past medical history  of CAD (coronary artery disease), Cataract, Diabetes mellitus without complication (HCC), Essential hypertension, Hyperlipidemia, Syncope, and Wears dentures.  PSH:    Past Surgical History:  Procedure Laterality Date   APPENDECTOMY     CARDIAC CATHETERIZATION     CATARACT EXTRACTION, BILATERAL     August 2017   CORONARY STENT INTERVENTION N/A 08/02/2017   Procedure: CORONARY STENT INTERVENTION;  Surgeon: Iran Ouch, MD;  Location: [facility]  INVASIVE CV LAB;  Service: Cardiovascular;  Laterality: N/A;   LEFT HEART CATH AND CORONARY ANGIOGRAPHY Left 08/02/2017   Procedure: LEFT HEART CATH AND CORONARY ANGIOGRAPHY;  Surgeon: Antonieta Iba, MD;  Location: [facility] INVASIVE CV LAB;  Service: Cardiovascular;  Laterality: Left;   MASS EXCISION Left 07/04/2017   Procedure: REMOVAL TWO CYST EAR EAR LOBE AND EXTERNAL EAR;  Surgeon: Linus Salmons, MD;  [facility];  Service: ENT;  Laterality: Left;  LOCAL Diabetic - insulin and oral meds   MOHS SURGERY     head   upper jaw Right 06/2019   at [facility]    Current Outpatient Medications  Medication Sig Dispense Refill   aspirin EC 81 MG tablet Take 81 mg by mouth daily.     atorvastatin (LIPITOR) 40 MG tablet Take 1 tablet (40 mg total) by mouth daily. 90 tablet 3   Blood Glucose Monitoring Suppl (ONETOUCH VERIO) w/Device KIT 1 Device by Does not apply route daily. 1 kit 0   glucose blood (ONETOUCH VERIO) test strip USE TO CHECK BLOOD SUGAR 2-3 TIMES PER DAY AS DIRECTED 300 each 1   insulin glargine (LANTUS SOLOSTAR) 100 UNIT/ML Solostar Pen INJECT SUBCUTANEOUSLY 16  UNITS DAILY 15 mL 4   Lancet Device MISC 1 Units by Does not apply route daily as needed. 100 each 11   Lancets (ONETOUCH DELICA PLUS LANCET33G) MISC USE TO CHECK BLOOD SUGAR 2-3 TIMES DAILY 300 each 1   magnesium oxide (MAG-OX) 400 (240 Mg) MG tablet TAKE 1 TABLET BY MOUTH TWICE DAILY 180 tablet 0   metFORMIN (GLUCOPHAGE) 1000 MG tablet TAKE 1 TABLET BY MOUTH TWICE DAILY WITH MEALS. 180 tablet 0   metoprolol tartrate (LOPRESSOR) 25 MG tablet TAKE 1/2 TABLET BY MOUTH TWICE DAILY 90 tablet 0   nitroGLYCERIN (NITROSTAT) 0.4 MG SL tablet Place 1 tablet (0.4 mg total) under the tongue every 5 (five) minutes as needed for chest pain. 25 tablet 2   patiromer (VELTASSA) 8.4 g packet TAKE THE CONTENTS OF 1 PACKET DAILY AS DIRECTED 30 each 12   vitamin B-12 (CYANOCOBALAMIN) 1000 MCG tablet Take 500 mcg by mouth daily.       No current facility-administered medications for this visit.     Allergies:   Neosporin [bacitracin-polymyxin b], Ace inhibitors, and Neosporin [neomycin-bacitracin zn-polymyx]   Social History:  The patient  reports that he quit smoking about 11 years ago. His smoking use included cigarettes. He has never used smokeless tobacco. He reports current alcohol use of about 7.0 - 11.0 standard drinks of alcohol per week. He reports that he does not use drugs.   Family History:   family history includes Arrhythmia (age of onset: 59) in his mother; Cancer in his father; Diabetes in his mother; Kidney cancer in his sister; Lung cancer in his sister.    Review of Systems: Review of Systems  Constitutional: Negative.   HENT: Negative.    Respiratory: Negative.    Cardiovascular: Negative.   Gastrointestinal: Negative.   Musculoskeletal: Negative.   Neurological: Negative.  Psychiatric/Behavioral: Negative.    All other systems reviewed and are negative.   PHYSICAL EXAM: VS:  BP 116/60 (BP Location: Left Arm, Patient Position: Sitting, Cuff Size: Normal)   Pulse 62   Ht 6' (1.829 m)   Wt 170 lb (77.1 kg)   SpO2 98%   BMI 23.06 kg/m  , BMI Body mass index is 23.06 kg/m. Constitutional:  oriented to person, place, and time. No distress.  HENT:  Head: Grossly normal Eyes:  no discharge. No scleral icterus.  Neck: No JVD, no carotid bruits  Cardiovascular: Regular rate and rhythm, no murmurs appreciated Pulmonary/Chest: Clear to auscultation bilaterally, no wheezes or rails Abdominal: Soft.  no distension.  no tenderness.  Musculoskeletal: Normal range of motion Neurological:  normal muscle tone. Coordination normal. No atrophy Skin: Skin warm and dry Psychiatric: normal affect, pleasant  Recent Labs: 03/07/2022: Magnesium 1.7 04/05/2022: Hemoglobin 12.9; Platelets 203; TSH 4.200 12/07/2022: ALT 16 12/24/2022: BUN 16; Creatinine, Ser 0.90; Potassium 5.1; Sodium 139    Lipid  Panel Lab Results  Component Value Date   CHOL 132 12/07/2022   HDL 72 12/07/2022   LDLCALC 48 12/07/2022   TRIG 55 12/07/2022    Wt Readings from Last 3 Encounters:  02/15/23 170 lb (77.1 kg)  12/07/22 169 lb 12.8 oz (77 kg)  09/05/22 170 lb 8 oz (77.3 kg)     ASSESSMENT AND PLAN:  Type 2 diabetes mellitus without complication, with long-term current use of insulin (HCC) Managed by primary care,  A1c in the 6 range, stable Weight stable  Syncope and collapse -  Prior history of occasional orthostasis symptoms with standing, Recommend he stay hydrated, blood pressure stable, no recent symptoms  CAD, Stable angina cardiac catheterization April 2019  Stent to the proximal RCA Currently with no symptoms of angina. No further workup at this time. Continue current medication regimen.  High cholesterol -  Cholesterol is at goal on the current lipid regimen. No changes to the medications were made.  Hyperkalemia avoid ACE inhibitors ARB On valtessa, potassium level elevated but stable    Orders Placed This Encounter  Procedures   EKG 12-Lead      Signed, Dossie Arbour, M.D., Ph.D. 02/15/2023  [facility], [facility] [place], [place] [phone]

## 2023-02-15 ENCOUNTER — Encounter: Payer: Self-pay | Admitting: Cardiovascular Disease

## 2023-02-15 ENCOUNTER — Ambulatory Visit: Payer: Medicare HMO | Attending: Cardiovascular Disease | Admitting: Cardiovascular Disease

## 2023-02-15 VITALS — BP 116/60 | HR 62 | Ht 72.0 in | Wt 170.0 lb

## 2023-02-15 DIAGNOSIS — Z87891 Personal history of nicotine dependence: Secondary | ICD-10-CM | POA: Diagnosis not present

## 2023-02-15 DIAGNOSIS — E1159 Type 2 diabetes mellitus with other circulatory complications: Secondary | ICD-10-CM | POA: Diagnosis not present

## 2023-02-15 DIAGNOSIS — I25119 Atherosclerotic heart disease of native coronary artery with unspecified angina pectoris: Secondary | ICD-10-CM | POA: Diagnosis not present

## 2023-02-15 DIAGNOSIS — Z794 Long term (current) use of insulin: Secondary | ICD-10-CM | POA: Diagnosis not present

## 2023-02-15 DIAGNOSIS — I700 Atherosclerosis of aorta: Secondary | ICD-10-CM

## 2023-02-15 DIAGNOSIS — E785 Hyperlipidemia, unspecified: Secondary | ICD-10-CM

## 2023-02-15 DIAGNOSIS — I152 Hypertension secondary to endocrine disorders: Secondary | ICD-10-CM

## 2023-02-15 DIAGNOSIS — E1169 Type 2 diabetes mellitus with other specified complication: Secondary | ICD-10-CM | POA: Diagnosis not present

## 2023-02-15 DIAGNOSIS — E1165 Type 2 diabetes mellitus with hyperglycemia: Secondary | ICD-10-CM | POA: Diagnosis not present

## 2023-02-15 NOTE — Patient Instructions (Signed)

## 2023-02-20 DIAGNOSIS — L570 Actinic keratosis: Secondary | ICD-10-CM | POA: Diagnosis not present

## 2023-03-04 ENCOUNTER — Other Ambulatory Visit: Payer: Self-pay | Admitting: Cardiovascular Disease

## 2023-03-10 NOTE — Patient Instructions (Signed)
Be Involved in Caring For Your Health:  Taking Medications When medications are taken as directed, they can greatly improve your health. But if they are not taken as prescribed, they may not work. In some cases, not taking them correctly can be harmful. To help ensure your treatment remains effective and safe, understand your medications and how to take them. Bring your medications to each visit for review by your provider.  Your lab results, notes, and after visit summary will be available on My Chart. We strongly encourage you to use this feature. If lab results are abnormal the clinic will contact you with the appropriate steps. If the clinic does not contact you assume the results are satisfactory. You can always view your results on My Chart. If you have questions regarding your health or results, please contact the clinic during office hours. You can also ask questions on My Chart.  We at [facility] are grateful that you chose [place] to provide your care. We strive to provide evidence-based and compassionate care and are always looking for feedback. If you get a survey from the clinic please complete this so we can hear your opinions.  Diabetes Mellitus and Exercise Regular exercise is important for your health, especially if you have diabetes mellitus. Exercise is not just about losing weight. It can also help you increase muscle strength and bone density and reduce body fat and stress. This can help your level of endurance and make you more fit and flexible. Why should I exercise if I have diabetes? Exercise has many benefits for people with diabetes. It can: Help lower and control your blood sugar (glucose). Help your body respond better and become more sensitive to the hormone insulin. Reduce how much insulin your body needs. Lower your risk for heart disease by: Lowering how much "bad" cholesterol and triglycerides you have in your body. Increasing how much "good" cholesterol  you have in your body. Lowering your blood pressure. Lowering your blood glucose levels. What is my activity plan? Your health care provider or an expert trained in diabetes care (certified diabetes educator) can help you make an activity plan. This plan can help you find the type of exercise that works for you. It may also tell you how often to exercise and for how long. Be sure to: Get at least 150 minutes of medium-intensity or high-intensity exercise each week. This may involve brisk walking, biking, or water aerobics. Do stretching and strengthening exercises at least 2 times a week. This may involve yoga or weight lifting. Spread out your activity over at least 3 days of the week. Get some form of physical activity each day. Do not go more than 2 days in a row without some kind of activity. Avoid being inactive for more than 30 minutes at a time. Take frequent breaks to walk or stretch. Choose activities that you enjoy. Set goals that you know you can accomplish. Start slowly and increase the intensity of your exercise over time. How do I manage my diabetes during exercise?  Monitor your blood glucose Check your blood glucose before and after you exercise. If your blood glucose is 240 mg/dL (40.9 mmol/L) or higher before you exercise, check your urine for ketones. These are chemicals created by the liver. If you have ketones in your urine, do not exercise until your blood glucose returns to normal. If your blood glucose is 100 mg/dL (5.6 mmol/L) or lower, eat a snack that has 15-20 grams of carbohydrate in  it. Check your blood glucose 15 minutes after the snack to make sure that your level is above 100 mg/dL (5.6 mmol/L) before you start to exercise. Your risk for low blood glucose (hypoglycemia) goes up during and after exercise. Know the symptoms of this condition and how to treat it. Follow these instructions at home: Keep a carbohydrate snack on hand for use before, during, and after  exercise. This can help prevent or treat hypoglycemia. Avoid injecting insulin into parts of your body that are going to be used during exercise. This may include: Your arms, when you are going to play tennis. Your legs, when you are about to go jogging. Keep track of your exercise habits. This can help you and your health care provider watch and adjust your activity plan. Write down: What you eat before and after you exercise. Blood glucose levels before and after you exercise. The type and amount of exercise you do. Talk to your health care provider before you start a new activity. They may need to: Make sure that the activity is safe for you. Adjust your insulin, other medicines, and food that you eat. Drink water while you exercise. This can stop you from losing too much water (dehydration). It can also prevent problems caused by having a lot of heat in your body (heat stroke). Where to find more information American Diabetes Association: diabetes.org Association of Diabetes Care & Education Specialists: diabeteseducator.org This information is not intended to replace advice given to you by your health care provider. Make sure you discuss any questions you have with your health care provider. Document Revised: 10/04/2021 Document Reviewed: 10/04/2021 Elsevier Patient Education  2024 [employer].

## 2023-03-12 ENCOUNTER — Ambulatory Visit: Payer: Medicare HMO | Admitting: Nurse Practitioner

## 2023-03-12 ENCOUNTER — Encounter: Payer: Self-pay | Admitting: Nurse Practitioner

## 2023-03-12 ENCOUNTER — Other Ambulatory Visit: Payer: Medicare HMO

## 2023-03-12 ENCOUNTER — Other Ambulatory Visit: Payer: Self-pay | Admitting: Nurse Practitioner

## 2023-03-12 VITALS — BP 111/58 | HR 79 | Temp 97.7°F | Ht 72.9 in | Wt 165.6 lb

## 2023-03-12 DIAGNOSIS — I152 Hypertension secondary to endocrine disorders: Secondary | ICD-10-CM

## 2023-03-12 DIAGNOSIS — Z23 Encounter for immunization: Secondary | ICD-10-CM | POA: Diagnosis not present

## 2023-03-12 DIAGNOSIS — E1169 Type 2 diabetes mellitus with other specified complication: Secondary | ICD-10-CM

## 2023-03-12 DIAGNOSIS — Z87891 Personal history of nicotine dependence: Secondary | ICD-10-CM | POA: Diagnosis not present

## 2023-03-12 DIAGNOSIS — E1165 Type 2 diabetes mellitus with hyperglycemia: Secondary | ICD-10-CM

## 2023-03-12 DIAGNOSIS — Z794 Long term (current) use of insulin: Secondary | ICD-10-CM

## 2023-03-12 DIAGNOSIS — I25119 Atherosclerotic heart disease of native coronary artery with unspecified angina pectoris: Secondary | ICD-10-CM | POA: Diagnosis not present

## 2023-03-12 DIAGNOSIS — E1159 Type 2 diabetes mellitus with other circulatory complications: Secondary | ICD-10-CM

## 2023-03-12 DIAGNOSIS — Z0000 Encounter for general adult medical examination without abnormal findings: Secondary | ICD-10-CM | POA: Diagnosis not present

## 2023-03-12 DIAGNOSIS — E875 Hyperkalemia: Secondary | ICD-10-CM

## 2023-03-12 DIAGNOSIS — E785 Hyperlipidemia, unspecified: Secondary | ICD-10-CM | POA: Diagnosis not present

## 2023-03-12 DIAGNOSIS — I700 Atherosclerosis of aorta: Secondary | ICD-10-CM

## 2023-03-12 LAB — BAYER DCA HB A1C WAIVED: HB A1C (BAYER DCA - WAIVED): 6.6 % — ABNORMAL HIGH (ref 4.8–5.6)

## 2023-03-12 NOTE — Assessment & Plan Note (Signed)
Chronic, stable.  BP remains at goal for age.  Continue current medication regimen and collaboration with cardiology.  Adjust as needed.  Recommend he continue to monitor BP occasionally at home and document + focus on DASH diet.  LABS: CBC, CMP, TSH.  Return in 3 months.

## 2023-03-12 NOTE — Assessment & Plan Note (Addendum)
Chronic, stable.  Noted on past imaging, continue statin and ASA daily for prevention.

## 2023-03-12 NOTE — Assessment & Plan Note (Signed)
Chronic, stable with A1c 6.6% today, remaining at goal.  Urine ALB 10 (January 2024).  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  Continue to monitor BS at home and a post prandial check.  Return in 3 months.

## 2023-03-12 NOTE — Assessment & Plan Note (Signed)
Smoker for 50 years, quit >10 years ago.  Recommend continued cessation.

## 2023-03-12 NOTE — Progress Notes (Signed)
BP (!) 111/58   Pulse 79   Temp 97.7 F (36.5 C) (Oral)   Ht 6' 0.9" (1.852 m)   Wt 165 lb 9.6 oz (75.1 kg)   SpO2 97%   BMI 21.91 kg/m    Subjective:    Patient ID: Johnny Wolfe, male    DOB: [birth year], 77 y.o.   MRN: [id number]  HPI: Johnny Wolfe is a 77 y.o. male presenting on 03/12/2023 for comprehensive medical examination. Current medical complaints include:none  He currently lives with: wife Interim Problems from his last visit: no  DIABETES August 6.5%.  Continues on Metformin 1000 MG BID and Lantus 16 units. Tried GLP1 and SGLT2, but these were not beneficial -- he lost weight and felt increased fatigue with these medications. Glutamic acid <5.0 and C peptide 2.1 in 2021 on check -- ?Type 1 1/2 vs Type 2.  Continues B12 supplement for history of lows. Hypoglycemic episodes: this morning had a 66 Polydipsia/polyuria: no Visual disturbance: no Chest pain: no Paresthesias: no Glucose Monitoring: yes             Accucheck frequency: Daily             Fasting glucose: ranges around 100             Post prandial:              Evening:             Before meals: Taking Insulin?: no             Long acting insulin: Lantus 16 units             Short acting insulin: Blood Pressure Monitoring: daily Retinal Examination: Up to Date Foot Exam: Up to Date Pneumovax: Up to Date Influenza: Up to Date Aspirin: yes   HYPERTENSION / HYPERLIPIDEMIA Taking ASA and Metoprolol + Lipitor. Saw cardiology 02/15/23, he has history of cardiac cath with stent to proximal RCA in April 2019 -- no changes recent visit.  Continues on Veltassa for hyperkalemia.  Has never used NTG.  History of aortic atherosclerosis noted on past imaging.   Satisfied with current treatment? yes Duration of hypertension: chronic BP monitoring frequency: not checking BP range: not checking BP medication side effects: no Duration of hyperlipidemia: chronic Cholesterol medication side effects:  no Cholesterol supplements: none Medication compliance: good compliance Aspirin: yes Recent stressors: no Recurrent headaches: no Visual changes: no Palpitations: no Dyspnea: with activity at times Chest pain: no Lower extremity edema: no Dizzy/lightheaded: no The 10-year ASCVD risk score (Arnett DK, et al., 2019) is: 31.6%   Values used to calculate the score:     Age: 10 years     Sex: Male     Is Non-Hispanic African American: No     Diabetic: Yes     Tobacco smoker: No     Systolic Blood Pressure: 111 mmHg     Is BP treated: Yes     HDL Cholesterol: 72 mg/dL     Total Cholesterol: 132 mg/dL  Functional Status Survey: Is the patient deaf or have difficulty hearing?: No Does the patient have difficulty seeing, even when wearing glasses/contacts?: No Does the patient have difficulty concentrating, remembering, or making decisions?: No Does the patient have difficulty walking or climbing stairs?: No Does the patient have difficulty dressing or bathing?: No Does the patient have difficulty doing errands alone such as visiting a doctor's office or shopping?: No  FALL RISK:    03/12/2023  1:05 PM 12/07/2022    1:10 PM 09/05/2022   10:25 AM 06/07/2022   11:24 AM 03/07/2022    2:10 PM  Fall Risk   Falls in the past year? 0 0 0 0 0  Number falls in past yr: 0 0 0 0 0  Injury with Fall? 0 0 0 0 0  Risk for fall due to : No Fall Risks No Fall Risks No Fall Risks No Fall Risks No Fall Risks  Follow up Falls evaluation completed Falls evaluation completed Falls evaluation completed Falls prevention discussed Falls evaluation completed   Depression Screen    03/12/2023    1:06 PM 12/07/2022    1:10 PM 09/05/2022   10:25 AM 06/07/2022   11:25 AM 03/07/2022    2:13 PM  Depression screen PHQ 2/9  Decreased Interest 0 0 0 0 0  Down, Depressed, Hopeless 0 0 0 0 0  PHQ - 2 Score 0 0 0 0 0  Altered sleeping 0 0 0 0 0  Tired, decreased energy 2 0 0 0 0  Change in appetite 0 0 0 0 0   Feeling bad or failure about yourself  0 0 0 0 0  Trouble concentrating 0 0 0 0 0  Moving slowly or fidgety/restless 0 0 0 0 0  Suicidal thoughts 0 0 0 0 0  PHQ-9 Score 2 0 0 0 0  Difficult doing work/chores Somewhat difficult Not difficult at all  Not difficult at all Not difficult at all      03/12/2023    1:06 PM 12/07/2022    1:11 PM 09/05/2022   10:25 AM 03/07/2022    2:13 PM  GAD 7 : Generalized Anxiety Score  Nervous, Anxious, on Edge 0 0 0 0  Control/stop worrying 0 0 0 0  Worry too much - different things 0 0 0 0  Trouble relaxing 0 0 0 0  Restless 0 0 0 0  Easily annoyed or irritable 0 0 0 0  Afraid - awful might happen 0 0 0 0  Total GAD 7 Score 0 0 0 0  Anxiety Difficulty Not difficult at all Not difficult at all Not difficult at all Not difficult at all   Advanced Directives A voluntary discussion about advance care planning including the explanation and discussion of advance directives was extensively discussed  with the patient for 15 minutes with patient.  He does have papers scanned into chart on 11/04/2017.  Explanation about the health care proxy and Living will was reviewed and packet with forms with explanation of how to fill them out was given.  During this discussion, the patient was able to identify a health care proxy as his wife and sons and has papers scanned into chart.  Past Medical History:  Past Medical History:  Diagnosis Date   Allergy    CAD (coronary artery disease)    a. 06/2017 Cardiac CT: Ca2+ score of 2310 - 95th%'ile;  b. 07/2017 Cath/PCI: LM nl, LAD 60p, 94m, LCX 60p, OM2 40p, RCA dominant, 80p (3.5x15 [place] DES), 69m, 30d, EF 55-65%.   Cataract    Diabetes mellitus without complication (HCC)    Essential hypertension    Hyperlipidemia    Syncope    Wears dentures    full upper and lower    Surgical History:  Past Surgical History:  Procedure Laterality Date   APPENDECTOMY     CARDIAC CATHETERIZATION     CATARACT EXTRACTION, BILATERAL  August 2017   CORONARY STENT INTERVENTION N/A 08/02/2017   Procedure: CORONARY STENT INTERVENTION;  Surgeon: Iran Ouch, MD;  Location: [facility] INVASIVE CV [facility];  Service: Cardiovascular;  Laterality: N/A;   LEFT HEART CATH AND CORONARY ANGIOGRAPHY Left 08/02/2017   Procedure: LEFT HEART CATH AND CORONARY ANGIOGRAPHY;  Surgeon: Antonieta Iba, MD;  Location: [facility] INVASIVE CV LAB;  Service: Cardiovascular;  Laterality: Left;   MASS EXCISION Left 07/04/2017   Procedure: REMOVAL TWO CYST EAR EAR LOBE AND EXTERNAL EAR;  Surgeon: Linus Salmons, MD;  Location: [facility];  Service: ENT;  Laterality: Left;  LOCAL Diabetic - insulin and oral meds   MOHS SURGERY     head   upper jaw Right 06/2019   at [facility]    Medications:  Current Outpatient Medications on File Prior to Visit  Medication Sig   aspirin EC 81 MG tablet Take 81 mg by mouth daily.   atorvastatin (LIPITOR) 40 MG tablet TAKE ONE TABLET BY MOUTH EVERY DAY   Blood Glucose Monitoring Suppl (ONETOUCH VERIO) w/Device KIT 1 Device by Does not apply route daily.   glucose blood (ONETOUCH VERIO) test strip USE TO CHECK BLOOD SUGAR 2-3 TIMES PER DAY AS DIRECTED   insulin glargine (LANTUS SOLOSTAR) 100 UNIT/ML Solostar Pen INJECT SUBCUTANEOUSLY 16  UNITS DAILY   Lancet Device MISC 1 Units by Does not apply route daily as needed.   Lancets (ONETOUCH DELICA PLUS LANCET33G) MISC USE TO CHECK BLOOD SUGAR 2-3 TIMES DAILY   magnesium oxide (MAG-OX) 400 (240 Mg) MG tablet TAKE 1 TABLET BY MOUTH TWICE DAILY   metFORMIN (GLUCOPHAGE) 1000 MG tablet TAKE 1 TABLET BY MOUTH TWICE DAILY WITH MEALS.   metoprolol tartrate (LOPRESSOR) 25 MG tablet TAKE 1/2 TABLET BY MOUTH TWICE DAILY   nitroGLYCERIN (NITROSTAT) 0.4 MG SL tablet Place 1 tablet (0.4 mg total) under the tongue every 5 (five) minutes as needed for chest pain.   patiromer (VELTASSA) 8.4 g packet TAKE THE CONTENTS OF 1 PACKET DAILY AS DIRECTED   vitamin B-12 (CYANOCOBALAMIN) 500  MCG tablet Take 500 mcg by mouth daily.   No current facility-administered medications on file prior to visit.    Allergies:  Allergies  Allergen Reactions   Neosporin [Bacitracin-Polymyxin B] Rash   Ace Inhibitors     Hyperkalemia   Neosporin [Neomycin-Bacitracin Zn-Polymyx]     Red rash     Social History:  Social History   Socioeconomic History   Marital status: Married    Spouse name: Not on file   Number of children: Not on file   Years of education: Not on file   Highest education level: Associate degree: academic program  Occupational History   Not on file  Tobacco Use   Smoking status: Former    Current packs/day: 0.00    Average packs/day: 1 pack/day for 45.0 years (45.0 ttl pk-yrs)    Types: Cigarettes    Quit date: 10/27/2011    Years since quitting: 11.3   Smokeless tobacco: Never  Vaping Use   Vaping status: Never Used  Substance and Sexual Activity   Alcohol use: Yes    Alcohol/week: 7.0 standard drinks of alcohol    Types: 7 Shots of liquor per week    Comment: weekly   Drug use: Never   Sexual activity: Not Currently  Other Topics Concern   Not on file  Social History Narrative   Not on file   Social Determinants of Health   Financial Resource  Strain: Low Risk  (03/08/2023)   Overall Financial Resource Strain (CARDIA)    Difficulty of Paying Living Expenses: Not hard at all  Food Insecurity: No Food Insecurity (03/08/2023)   Hunger Vital Sign    Worried About Running Out of Food in the Last Year: Never true    Ran Out of Food in the Last Year: Never true  Transportation Needs: No Transportation Needs (03/08/2023)   PRAPARE - Administrator, Civil Service (Medical): No    Lack of Transportation (Non-Medical): No  Physical Activity: Insufficiently Active (03/08/2023)   Exercise Vital Sign    Days of Exercise per Week: 2 days    Minutes of Exercise per Session: 20 min  Stress: No Stress Concern Present (03/08/2023)   [employer] of Occupational Health - Occupational Stress Questionnaire    Feeling of Stress : Not at all  Social Connections: Moderately Isolated (03/08/2023)   Social Connection and Isolation Panel [NHANES]    Frequency of Communication with Friends and Family: Twice a week    Frequency of Social Gatherings with Friends and Family: Once a week    Attends Religious Services: Never    Database administrator or Organizations: No    Attends Banker Meetings: Never    Marital Status: Married  Catering manager Violence: Not At Risk (06/07/2022)   Humiliation, Afraid, Rape, and Kick questionnaire    Fear of Current or Ex-Partner: No    Emotionally Abused: No    Physically Abused: No    Sexually Abused: No   Social History   Tobacco Use  Smoking Status Former   Current packs/day: 0.00   Average packs/day: 1 pack/day for 45.0 years (45.0 ttl pk-yrs)   Types: Cigarettes   Quit date: 10/27/2011   Years since quitting: 11.3  Smokeless Tobacco Never   Social History   Substance and Sexual Activity  Alcohol Use Yes   Alcohol/week: 7.0 standard drinks of alcohol   Types: 7 Shots of liquor per week   Comment: weekly    Family History:  Family History  Problem Relation Age of Onset   Diabetes Mother    Arrhythmia Mother 44       Pacemaker implant    Cancer Father        lung   Lung cancer Sister    Cancer Sister    Kidney cancer Sister     Past medical history, surgical history, medications, allergies, family history and social history reviewed with patient today and changes made to appropriate areas of the chart.   ROS All other ROS negative except what is listed above and in the HPI.      Objective:    BP (!) 111/58   Pulse 79   Temp 97.7 F (36.5 C) (Oral)   Ht 6' 0.9" (1.852 m)   Wt 165 lb 9.6 oz (75.1 kg)   SpO2 97%   BMI 21.91 kg/m   Wt Readings from Last 3 Encounters:  03/12/23 165 lb 9.6 oz (75.1 kg)  02/15/23 170 lb (77.1 kg)  12/07/22 169 lb 12.8  oz (77 kg)    Physical Exam Vitals and nursing note reviewed.  Constitutional:      General: He is awake. He is not in acute distress.    Appearance: He is well-developed and well-groomed. He is not ill-appearing or toxic-appearing.  HENT:     Head: Normocephalic and atraumatic.     Right Ear: Hearing, tympanic membrane, ear canal  and external ear normal. No drainage.     Left Ear: Hearing, tympanic membrane, ear canal and external ear normal. No drainage.     Nose: Nose normal.     Mouth/Throat:     Pharynx: Uvula midline.  Eyes:     General: Lids are normal.        Right eye: No discharge.        Left eye: No discharge.     Extraocular Movements: Extraocular movements intact.     Conjunctiva/sclera: Conjunctivae normal.     Pupils: Pupils are equal, round, and reactive to light.     Visual Fields: Right eye visual fields normal and left eye visual fields normal.  Neck:     Thyroid: No thyromegaly.     Vascular: No carotid bruit or JVD.     Trachea: Trachea normal.  Cardiovascular:     Rate and Rhythm: Normal rate and regular rhythm.     Heart sounds: Normal heart sounds, S1 normal and S2 normal. No murmur heard.    No gallop.  Pulmonary:     Effort: Pulmonary effort is normal. No accessory muscle usage or respiratory distress.     Breath sounds: Normal breath sounds.  Abdominal:     General: Bowel sounds are normal.     Palpations: Abdomen is soft. There is no hepatomegaly or splenomegaly.     Tenderness: There is no abdominal tenderness.  Musculoskeletal:        General: Normal range of motion.     Cervical back: Normal range of motion and neck supple.     Right lower leg: No edema.     Left lower leg: No edema.  Lymphadenopathy:     Head:     Right side of head: No submental, submandibular, tonsillar, preauricular or posterior auricular adenopathy.     Left side of head: No submental, submandibular, tonsillar, preauricular or posterior auricular adenopathy.      Cervical: No cervical adenopathy.  Skin:    General: Skin is warm and dry.     Capillary Refill: Capillary refill takes less than 2 seconds.     Findings: No rash.  Neurological:     Mental Status: He is alert and oriented to person, place, and time.     Gait: Gait is intact.     Deep Tendon Reflexes: Reflexes are normal and symmetric.     Reflex Scores:      Brachioradialis reflexes are 2+ on the right side and 2+ on the left side.      Patellar reflexes are 2+ on the right side and 2+ on the left side. Psychiatric:        Attention and Perception: Attention normal.        Mood and Affect: Mood normal.        Speech: Speech normal.        Behavior: Behavior normal. Behavior is cooperative.        Thought Content: Thought content normal.        Cognition and Memory: Cognition normal.        Judgment: Judgment normal.       06/07/2022   11:37 AM 03/07/2022    2:46 PM 02/23/2021    8:41 AM 03/11/2020    1:06 PM 02/18/2019    1:17 PM  6CIT Screen  What Year? 0 points 0 points 0 points 0 points 0 points  What month? 0 points 0 points 0 points 0 points 0 points  What  time? 0 points 0 points 0 points 0 points 0 points  Count back from 20 0 points 0 points 0 points 0 points 0 points  Months in reverse 0 points 0 points 0 points 0 points 0 points  Repeat phrase 0 points 0 points 2 points 2 points 0 points  Total Score 0 points 0 points 2 points 2 points 0 points   Results for orders placed or performed in visit on 03/12/23  Bayer DCA Hb A1c Waived  Result Value Ref Range   HB A1C (BAYER DCA - WAIVED) 6.6 (H) 4.8 - 5.6 %      Assessment & Plan:   Problem List Items Addressed This Visit       Cardiovascular and Mediastinum   Aortic atherosclerosis (HCC) (Chronic)    Chronic, stable.  Noted on past imaging, continue statin and ASA daily for prevention.      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  Continue current medication regimen and collaboration with cardiology.      Hypertension associated with diabetes (HCC) (Chronic)    Chronic, stable.  BP remains at goal for age.  Continue current medication regimen and collaboration with cardiology.  Adjust as needed.  Recommend he continue to monitor BP occasionally at home and document + focus on DASH diet.  LABS: CBC, CMP, TSH.  Return in 3 months.         Endocrine   Hyperlipidemia associated with type 2 diabetes mellitus (HCC) (Chronic)    Chronic, stable.  Continue current medication regimen and adjust as needed.  Current LDL well controlled.  Lipid panel today.      Type 2 diabetes mellitus with hyperglycemia (HCC) - Primary (Chronic)    Chronic, stable with A1c 6.6% today, remaining at goal.  Urine ALB 10 (January 2024).  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  Continue to monitor BS at home and a post prandial check.  Return in 3 months.        Other   Hyperkalemia (Chronic)    Chronic, stable.  Stable levels with Veltassa on board.  Continue current medication regimen -- full work-up performed in April 2022 and all WNL.  Consider referral to nephrology or endocrinology as needed.  Discussed with patient.      History of nicotine use    Smoker for 50 years, quit >10 years ago.  Recommend continued cessation.      Other Visit Diagnoses     Flu vaccine need       Flu vaccine today, educated on this.   Encounter for annual physical exam       Annual physical today with labs and health maintenance reviewed, discussed with patient.       Discussed aspirin prophylaxis for myocardial infarction prevention and decision was made to continue ASA  LABORATORY TESTING:  Health maintenance labs ordered today as discussed above.   The natural  history of prostate cancer and ongoing controversy regarding screening and potential treatment outcomes of prostate cancer has been discussed with the patient. The meaning of a false positive PSA and a false negative PSA has been discussed. He indicates understanding of the limitations of this screening test and wishes to proceed with screening  PSA testing.  IMMUNIZATIONS:   - Tdap: Tetanus vaccination status reviewed: last tetanus booster within 10 years. - Influenza: Up to date - Pneumovax: Up to date - Prevnar: Up to date - Zostavax vaccine: Up to date  SCREENING: - Colonoscopy: Up to date  Discussed with patient purpose of the colonoscopy is to detect colon cancer at curable precancerous or early stages   - AAA Screening: 03/07/2021 -- this was negative -Hearing Test: Not applicable  -Spirometry: Not applicable   PATIENT COUNSELING:    Sexuality: Discussed sexually transmitted diseases, partner selection, use of condoms, avoidance of unintended pregnancy  and contraceptive alternatives.   Advised to avoid cigarette smoking.  I discussed with the patient that most people either abstain from alcohol or drink within safe limits (<=14/week and <=4 drinks/occasion for males, <=7/weeks and <= 3 drinks/occasion for females) and that the risk for alcohol disorders and other health effects rises proportionally with the number of drinks per week and how often a drinker exceeds daily limits.  Discussed cessation/primary prevention of drug use and availability of treatment for abuse.   Diet: Encouraged to adjust caloric intake to maintain  or achieve ideal body weight, to reduce intake of dietary saturated fat and total fat, to limit sodium intake by avoiding high sodium foods and not adding table salt, and to maintain adequate dietary potassium and calcium preferably from fresh fruits, vegetables, and low-fat dairy products.    Stressed the importance of regular exercise  Injury prevention:  Discussed safety belts, safety helmets, smoke detector, smoking near bedding or upholstery.   Dental health: Discussed importance of regular tooth brushing, flossing, and dental visits.   Follow up plan: NEXT PREVENTATIVE PHYSICAL DUE IN 1 YEAR. Return in about 3 months (around 06/12/2023) for T2DM, HTN/HLD.

## 2023-03-12 NOTE — Assessment & Plan Note (Signed)
Chronic, stable.  Continue current medication regimen and adjust as needed.  Current LDL well controlled.  Lipid panel today.

## 2023-03-12 NOTE — Assessment & Plan Note (Signed)
Chronic, stable with no recent NTG use.  Continue current medication regimen and collaboration with cardiology. 

## 2023-03-12 NOTE — Assessment & Plan Note (Signed)
Chronic, stable.  Stable levels with Veltassa on board.  Continue current medication regimen -- full work-up performed in April 2022 and all WNL.  Consider referral to nephrology or endocrinology as needed.  Discussed with patient. 

## 2023-03-13 LAB — LIPID PANEL W/O CHOL/HDL RATIO
Cholesterol, Total: 112 mg/dL (ref 100–199)
HDL: 50 mg/dL (ref >39)
LDL Chol Calc (NIH): 44 mg/dL (ref 0–99)
Triglycerides: 98 mg/dL (ref 0–149)
VLDL Cholesterol Cal: 18 mg/dL (ref 5–40)

## 2023-03-13 LAB — COMPREHENSIVE METABOLIC PANEL
ALT: 10 [IU]/L (ref 0–44)
AST: 12 [IU]/L (ref 0–40)
Albumin: 4.2 g/dL (ref 3.8–4.8)
Alkaline Phosphatase: 129 [IU]/L — ABNORMAL HIGH (ref 44–121)
BUN/Creatinine Ratio: 17 (ref 10–24)
BUN: 17 mg/dL (ref 8–27)
Bilirubin Total: 0.5 mg/dL (ref 0.0–1.2)
CO2: 24 mmol/L (ref 20–29)
Calcium: 10 mg/dL (ref 8.6–10.2)
Chloride: 100 mmol/L (ref 96–106)
Creatinine, Ser: 0.99 mg/dL (ref 0.76–1.27)
Globulin, Total: 2.4 g/dL (ref 1.5–4.5)
Glucose: 95 mg/dL (ref 70–99)
Potassium: 4.7 mmol/L (ref 3.5–5.2)
Sodium: 140 mmol/L (ref 134–144)
Total Protein: 6.6 g/dL (ref 6.0–8.5)
eGFR: 79 mL/min/{1.73_m2} (ref >59)

## 2023-03-13 LAB — CBC WITH DIFFERENTIAL/PLATELET
Basophils Absolute: 0.1 10*3/uL (ref 0.0–0.2)
Basos: 1 %
EOS (ABSOLUTE): 0.4 10*3/uL (ref 0.0–0.4)
Eos: 4 %
Hematocrit: 38.8 % (ref 37.5–51.0)
Hemoglobin: 12.4 g/dL — ABNORMAL LOW (ref 13.0–17.7)
Immature Grans (Abs): 0 10*3/uL (ref 0.0–0.1)
Immature Granulocytes: 0 %
Lymphocytes Absolute: 1.3 10*3/uL (ref 0.7–3.1)
Lymphs: 14 %
MCH: 30.8 pg (ref 26.6–33.0)
MCHC: 32 g/dL (ref 31.5–35.7)
MCV: 97 fL (ref 79–97)
Monocytes Absolute: 0.7 10*3/uL (ref 0.1–0.9)
Monocytes: 7 %
Neutrophils Absolute: 6.7 10*3/uL (ref 1.4–7.0)
Neutrophils: 74 %
Platelets: 264 10*3/uL (ref 150–450)
RBC: 4.02 x10E6/uL — ABNORMAL LOW (ref 4.14–5.80)
RDW: 12.1 % (ref 11.6–15.4)
WBC: 9.2 10*3/uL (ref 3.4–10.8)

## 2023-03-13 LAB — TSH: TSH: 3.73 u[IU]/mL (ref 0.450–4.500)

## 2023-03-13 NOTE — Progress Notes (Signed)
Contacted via MyChart   Good afternoon Manpreet, your labs have returned and remain stable. Hemoglobin and RBC still a little low, but remainder of CBC stable.  Ensure you are getting lots of rich leafy greens in diet and some red meat.  Potassium level is normal.  It looks like your insurance may no longer cover Veltassa, but may Lokelma.  Check with them and let me know if this is correct.  Any questions? Keep being awesome!!  Thank you for allowing me to participate in your care.  I appreciate you. Kindest regards, Jahleah Mariscal

## 2023-03-14 ENCOUNTER — Encounter: Payer: Self-pay | Admitting: Nurse Practitioner

## 2023-04-23 NOTE — Patient Instructions (Signed)
 Be Involved in Caring For Your Health:  Taking Medications When medications are taken as directed, they can greatly improve your health. But if they are not taken as prescribed, they may not work. In some cases, not taking them correctly can be harmful. To help ensure your treatment remains effective and safe, understand your medications and how to take them. Bring your medications to each visit for review by your provider.  Your lab results, notes, and after visit summary will be available on My Chart. We strongly encourage you to use this feature. If lab results are abnormal the clinic will contact you with the appropriate steps. If the clinic does not contact you assume the results are satisfactory. You can always view your results on My Chart. If you have questions regarding your health or results, please contact the clinic during office hours. You can also ask questions on My Chart.  We at [facility] are grateful that you chose [place] to provide your care. We strive to provide evidence-based and compassionate care and are always looking for feedback. If you get a survey from the clinic please complete this so we can hear your opinions.  Cough, Adult A cough helps to clear your throat and lungs. It may be a sign of an illness or another condition. A short-term (acute) cough may last 2-3 weeks. A long-term (chronic) cough may last 8 or more weeks. Many things can cause a cough. They include: Illnesses such as: An infection in your throat or lungs. Asthma or other heart or lung problems. Gastroesophageal reflux. This is when acid comes back up from your stomach. Breathing in things that bother (irritate) your lungs. Allergies. Postnasal drip. This is when mucus runs down the back of your throat. Smoking. Some medicines. Follow these instructions at home: Medicines Take over-the-counter and prescription medicines only as told by your doctor. Talk with your doctor before you take  cough medicine (cough suppressants). Eating and drinking Do not drink alcohol. Do not drink caffeine. Drink enough fluid to keep your pee (urine) pale yellow. Lifestyle Stay away from cigarette smoke. Do not smoke or use any products that contain nicotine or tobacco. If you need help quitting, ask your doctor. Stay away from things that make you cough. These may include perfume, candles, cleaning products, or campfire smoke. General instructions  Watch for any changes to your cough. Tell your doctor about them. Always cover your mouth when you cough. If the air is dry in your home, use a cool mist vaporizer or humidifier. If your cough is worse at night, try using extra pillows to raise your head up higher while you sleep. Rest as needed. Contact a doctor if: You have new symptoms. Your symptoms get worse. You cough up pus. You have a fever that does not go away. Your cough does not get better after 2-3 weeks. Cough medicine does not help, and you are not sleeping well. You have pain that gets worse or is not helped with medicine. You are losing weight and do not know why. You have night sweats. Get help right away if: You cough up blood. You have trouble breathing. Your heart is beating very fast. These symptoms may be an emergency. Get help right away. Call 911. Do not wait to see if the symptoms will go away. Do not drive yourself to the hospital. This information is not intended to replace advice given to you by your health care provider. Make sure you discuss any questions you  have with your health care provider. Document Revised: 12/15/2021 Document Reviewed: 12/15/2021 Elsevier Patient Education  2024 [employer].

## 2023-04-26 ENCOUNTER — Encounter: Payer: Self-pay | Admitting: Nurse Practitioner

## 2023-04-26 ENCOUNTER — Ambulatory Visit (INDEPENDENT_AMBULATORY_CARE_PROVIDER_SITE_OTHER): Payer: Medicare HMO | Admitting: Nurse Practitioner

## 2023-04-26 VITALS — BP 110/57 | HR 67 | Temp 97.3°F | Ht 72.9 in | Wt 166.4 lb

## 2023-04-26 DIAGNOSIS — R052 Subacute cough: Secondary | ICD-10-CM | POA: Insufficient documentation

## 2023-04-26 MED ORDER — AMOXICILLIN-POT CLAVULANATE 875-125 MG PO TABS: 1.0000 | ORAL_TABLET | Freq: Two times a day (BID) | ORAL | 0 refills | Status: AC

## 2023-04-26 NOTE — Progress Notes (Signed)
Acute Office Visit  Subjective:     Patient ID: Johnny Wolfe, male    DOB: [birth year], 77 y.o.   MRN: [id number]  Chief Complaint  Patient presents with   Cough    Patient states he has been having an on and off cough since around the beginning of October. States he does cough up some phlegm when he coughs. Describes it as sounding like a smokers cough.     Has had a cough present on and off for 3 months, feels like he has had a cold a couple of times during this.  Reports it can be like a smoker's cough.  Does have post nasal drip at baseline.  Current cough is more recent and productive of yellow phlegm.  No pain with the cough.  At night will wake up and cough on occasion + throat felt dry.    Cough This is a recurrent problem. The current episode started more than 1 month ago. The problem has been waxing and waning. The problem occurs every few minutes. The cough is Productive of sputum. Associated symptoms include postnasal drip, rhinorrhea and wheezing (occasional at night). Pertinent negatives include no chest pain, chills, ear congestion, ear pain, fever, headaches, heartburn, hemoptysis, myalgias, nasal congestion, sore throat, shortness of breath, sweats or weight loss. Nothing aggravates the symptoms. He has tried rest and OTC cough suppressant for the symptoms. The treatment provided no relief. There is no history of asthma, bronchiectasis, COPD, emphysema or pneumonia.   Patient is in today for cough.  Review of Systems  Constitutional:  Negative for chills, fever, malaise/fatigue and weight loss.  HENT:  Positive for postnasal drip and rhinorrhea. Negative for congestion, ear pain, sinus pain and sore throat.   Respiratory:  Positive for cough and wheezing (occasional at night). Negative for hemoptysis and shortness of breath.   Cardiovascular:  Negative for chest pain, palpitations, orthopnea and leg swelling.  Gastrointestinal:  Negative for heartburn.  Musculoskeletal:   Negative for myalgias.  Neurological:  Negative for dizziness, weakness and headaches.  Psychiatric/Behavioral: Negative.        Objective:    BP (!) 110/57   Pulse 67   Temp (!) 97.3 F (36.3 C) (Oral)   Ht 6' 0.9" (1.852 m)   Wt 166 lb 6.4 oz (75.5 kg)   SpO2 93%   BMI 22.01 kg/m  BP Readings from Last 3 Encounters:  04/26/23 (!) 110/57  03/12/23 (!) 111/58  02/15/23 116/60   Wt Readings from Last 3 Encounters:  04/26/23 166 lb 6.4 oz (75.5 kg)  03/12/23 165 lb 9.6 oz (75.1 kg)  02/15/23 170 lb (77.1 kg)      Physical Exam Vitals and nursing note reviewed.  Constitutional:      General: He is awake. He is not in acute distress.    Appearance: Normal appearance. He is well-developed and well-groomed. He is not ill-appearing or toxic-appearing.  HENT:     Head: Normocephalic.     Right Ear: Hearing, ear canal and external ear normal. A middle ear effusion is present. There is no impacted cerumen. Tympanic membrane is not injected.     Left Ear: Hearing, ear canal and external ear normal. A middle ear effusion is present. There is no impacted cerumen. Tympanic membrane is not injected.     Nose: No rhinorrhea.     Right Sinus: No maxillary sinus tenderness or frontal sinus tenderness.     Left Sinus: No maxillary sinus tenderness or frontal  sinus tenderness.     Mouth/Throat:     Mouth: Mucous membranes are moist.     Pharynx: Posterior oropharyngeal erythema (mild) and postnasal drip present. No pharyngeal swelling or oropharyngeal exudate.  Eyes:     General: Lids are normal.     Extraocular Movements: Extraocular movements intact.     Conjunctiva/sclera: Conjunctivae normal.  Neck:     Thyroid: No thyromegaly.     Vascular: No carotid bruit.  Cardiovascular:     Rate and Rhythm: Normal rate and regular rhythm.     Heart sounds: Normal heart sounds. No murmur heard.    No gallop.  Pulmonary:     Effort: No accessory muscle usage or respiratory distress.      Breath sounds: Wheezing present. No decreased breath sounds or rhonchi.     Comments: Occasional expiratory wheezes noted throughout. Abdominal:     General: Bowel sounds are normal. There is no distension.     Palpations: Abdomen is soft.     Tenderness: There is no abdominal tenderness.  Musculoskeletal:     Cervical back: Full passive range of motion without pain.     Right lower leg: No edema.     Left lower leg: No edema.  Lymphadenopathy:     Cervical: No cervical adenopathy.  Skin:    General: Skin is warm.     Capillary Refill: Capillary refill takes less than 2 seconds.  Neurological:     Mental Status: He is alert and oriented to person, place, and time.     Deep Tendon Reflexes: Reflexes are normal and symmetric.     Reflex Scores:      Brachioradialis reflexes are 2+ on the right side and 2+ on the left side.      Patellar reflexes are 2+ on the right side and 2+ on the left side. Psychiatric:        Attention and Perception: Attention normal.        Mood and Affect: Mood normal.        Speech: Speech normal.        Behavior: Behavior normal. Behavior is cooperative.        Thought Content: Thought content normal.    No results found for any visits on 04/26/23.      Assessment & Plan:   Problem List Items Addressed This Visit       Other   Subacute cough - Primary   Waxing and waning with URIs for 3 months. Higher risk due to diabetes.  Some wheezes noted, but no crackles.  Will start Augmentin BID for 7 days.  Avoid Prednisone as prefers to maintain current sugar levels.  Does not wish to obtain an inhaler.  Recommend: - Increased rest - Increasing Fluids - Acetaminophen as needed for fever/pain.  - Salt water gargling, chloraseptic spray and throat lozenges - OTC diabetic tussin - Mucinex.   - Humidifying the air.  If worsening or ongoing will obtain imaging.       Meds ordered this encounter  Medications   amoxicillin-clavulanate (AUGMENTIN)  875-125 MG tablet    Sig: Take 1 tablet by mouth 2 (two) times daily for 7 days.    Dispense:  14 tablet    Refill:  0    Return if symptoms worsen or fail to improve.  Marjie Skiff, NP

## 2023-04-26 NOTE — Assessment & Plan Note (Signed)
Waxing and waning with URIs for 3 months. Higher risk due to diabetes.  Some wheezes noted, but no crackles.  Will start Augmentin BID for 7 days.  Avoid Prednisone as prefers to maintain current sugar levels.  Does not wish to obtain an inhaler.  Recommend: - Increased rest - Increasing Fluids - Acetaminophen as needed for fever/pain.  - Salt water gargling, chloraseptic spray and throat lozenges - OTC diabetic tussin - Mucinex.   - Humidifying the air.  If worsening or ongoing will obtain imaging.

## 2023-04-29 ENCOUNTER — Ambulatory Visit: Payer: Medicare HMO | Admitting: Nurse Practitioner

## 2023-05-06 ENCOUNTER — Other Ambulatory Visit (HOSPITAL_COMMUNITY): Payer: Self-pay

## 2023-05-06 ENCOUNTER — Encounter: Payer: Self-pay | Admitting: Nurse Practitioner

## 2023-05-06 DIAGNOSIS — I25119 Atherosclerotic heart disease of native coronary artery with unspecified angina pectoris: Secondary | ICD-10-CM

## 2023-05-06 MED ORDER — ATORVASTATIN CALCIUM 40 MG PO TABS
40.0000 mg | ORAL_TABLET | Freq: Every day | ORAL | 4 refills | Status: AC
  Filled 2023-05-06 – 2023-05-07 (×3): qty 90, 90d supply, fill #0
  Filled 2023-08-01: qty 90, 90d supply, fill #1
  Filled 2023-10-30: qty 90, 90d supply, fill #2
  Filled 2024-01-28: qty 90, 90d supply, fill #3
  Filled 2024-04-22: qty 90, 90d supply, fill #4

## 2023-05-06 MED ORDER — LANTUS SOLOSTAR 100 UNIT/ML ~~LOC~~ SOPN
PEN_INJECTOR | SUBCUTANEOUS | 4 refills | Status: AC
  Filled 2023-05-06 – 2023-05-07 (×3): qty 15, 90d supply, fill #0
  Filled 2023-08-01: qty 15, 90d supply, fill #1
  Filled 2023-12-06: qty 15, 90d supply, fill #2
  Filled 2024-01-28 – 2024-03-03 (×2): qty 15, 90d supply, fill #3
  Filled 2024-04-22: qty 15, 90d supply, fill #4

## 2023-05-06 MED ORDER — METOPROLOL TARTRATE 25 MG PO TABS
12.5000 mg | ORAL_TABLET | Freq: Two times a day (BID) | ORAL | 4 refills | Status: AC
  Filled 2023-05-06 – 2023-05-07 (×3): qty 90, 90d supply, fill #0
  Filled 2023-08-01: qty 90, 90d supply, fill #1
  Filled 2023-10-30: qty 90, 90d supply, fill #2
  Filled 2024-01-28: qty 90, 90d supply, fill #3
  Filled 2024-04-22: qty 90, 90d supply, fill #4

## 2023-05-06 MED ORDER — METFORMIN HCL 1000 MG PO TABS
ORAL_TABLET | ORAL | 4 refills | Status: AC
  Filled 2023-05-06 – 2023-05-07 (×3): qty 180, 90d supply, fill #0
  Filled 2023-08-01: qty 180, 90d supply, fill #1
  Filled 2023-10-30: qty 180, 90d supply, fill #2
  Filled 2024-01-28: qty 180, 90d supply, fill #3
  Filled 2024-04-22: qty 180, 90d supply, fill #4

## 2023-05-06 MED ORDER — VELTASSA 8.4 G PO PACK
PACK | ORAL | 4 refills | Status: AC
  Filled 2023-05-06 – 2023-05-09 (×3): qty 90, 90d supply, fill #0
  Filled 2023-10-16: qty 90, 90d supply, fill #1
  Filled 2024-03-03: qty 30, 30d supply, fill #2
  Filled 2024-03-05 – 2024-03-11 (×2): qty 90, 90d supply, fill #2
  Filled 2024-04-22: qty 90, 90d supply, fill #3

## 2023-05-07 ENCOUNTER — Other Ambulatory Visit: Payer: Self-pay

## 2023-05-07 ENCOUNTER — Other Ambulatory Visit (HOSPITAL_COMMUNITY): Payer: Self-pay

## 2023-05-09 ENCOUNTER — Other Ambulatory Visit: Payer: Self-pay

## 2023-05-09 ENCOUNTER — Other Ambulatory Visit (HOSPITAL_COMMUNITY): Payer: Self-pay

## 2023-05-10 ENCOUNTER — Other Ambulatory Visit (HOSPITAL_COMMUNITY): Payer: Self-pay

## 2023-05-10 ENCOUNTER — Other Ambulatory Visit: Payer: Self-pay

## 2023-05-15 ENCOUNTER — Other Ambulatory Visit: Payer: Self-pay

## 2023-05-17 ENCOUNTER — Other Ambulatory Visit: Payer: Self-pay

## 2023-05-17 ENCOUNTER — Other Ambulatory Visit (HOSPITAL_COMMUNITY): Payer: Self-pay

## 2023-05-21 ENCOUNTER — Encounter: Payer: Self-pay | Admitting: Nurse Practitioner

## 2023-05-21 ENCOUNTER — Other Ambulatory Visit (HOSPITAL_COMMUNITY): Payer: Self-pay

## 2023-05-24 ENCOUNTER — Telehealth (INDEPENDENT_AMBULATORY_CARE_PROVIDER_SITE_OTHER): Payer: PPO | Admitting: Nurse Practitioner

## 2023-05-24 ENCOUNTER — Ambulatory Visit
Admission: RE | Admit: 2023-05-24 | Discharge: 2023-05-24 | Disposition: A | Discharge status: Home / Self Care | Payer: Self-pay | Source: Ambulatory Visit | Attending: Nurse Practitioner | Admitting: Nurse Practitioner

## 2023-05-24 ENCOUNTER — Encounter: Payer: Self-pay | Admitting: Nurse Practitioner

## 2023-05-24 ENCOUNTER — Ambulatory Visit
Admission: RE | Admit: 2023-05-24 | Discharge: 2023-05-24 | Disposition: A | Discharge status: Home / Self Care | Payer: Self-pay | Attending: Nurse Practitioner | Admitting: Nurse Practitioner

## 2023-05-24 DIAGNOSIS — R052 Subacute cough: Secondary | ICD-10-CM

## 2023-05-24 MED ORDER — PREDNISONE 20 MG PO TABS: 40.0000 mg | ORAL_TABLET | Freq: Every day | ORAL | 0 refills | Status: AC

## 2023-05-24 NOTE — Progress Notes (Signed)
There were no vitals taken for this visit.   Subjective:    Patient ID: Markian Glockner, male    DOB: Nov 27, 1945, 78 y.o.   MRN: [id number]  HPI: Mancil Pfenning is a 78 y.o. male  Chief Complaint  Patient presents with   Cough    Patient states he has been having a persistent cough since last visit. States cough is mo dry first thing in the mornings and at night but does become a little bit productive during the day.    Virtual Visit via Video Note  I connected with Daivd Fredericksen on 05/24/23 at  8:20 AM EST by a video enabled telemedicine application and verified that I am speaking with the correct person using two identifiers.  Location: Patient: home Provider: work   I discussed the limitations of evaluation and management by telemedicine and the availability of in person appointments. The patient expressed understanding and agreed to proceed.  I discussed the assessment and treatment plan with the patient. The patient was provided an opportunity to ask questions and all were answered. The patient agreed with the plan and demonstrated an understanding of the instructions.   The patient was advised to call back or seek an in-person evaluation if the symptoms worsen or if the condition fails to improve as anticipated.  I provided 25 minutes of non-face-to-face time during this encounter.   Marjie Skiff, NP   COUGH Presents today for follow-up on cough which is continuing. Was seen on 04/26/23 for this and treated with Augmentin.  Diabetic, well controlled, but avoided Prednisone at that time.  Th cough he has reported as present on and off for 3 months, has had a couple of URI during this time.  Has post nasal drainage at baseline, has tried nasal sprays without benefit.  Has not tried over the counter allergy medicines.  Has not pain with the cough.  Quit smoking cigarettes 10-11 years ago.  He reports antibiotic did not help much.  Persistent cough that is dry first thing in the  morning and at night.  Can be productive during the day. Duration: months Circumstances of initial development of cough: some URI Cough severity: moderate Cough description: varies Aggravating factors: eating spicy stuff, not much -- eats 2-3 times a year Alleviating factors: nothing Status:  fluctuating Treatments attempted: cough suppressant, antibiotic Wheezing: a little bit at night Shortness of breath: no Chest pain: no Chest tightness:no Nasal congestion: no Runny nose: yes Postnasal drip: yes Frequent throat clearing or swallowing: yes Hemoptysis: no Fevers: no Night sweats: no Weight loss: no Heartburn:  1-2 times a year, uses TUMS if needed Recent foreign travel: no Tuberculosis contacts: no   Relevant past medical, surgical, family and social history reviewed and updated as indicated. Interim medical history since our last visit reviewed. Allergies and medications reviewed and updated.  Review of Systems  Constitutional:  Negative for activity change, appetite change, diaphoresis, fatigue and fever.  HENT:  Positive for postnasal drip and rhinorrhea. Negative for congestion.   Respiratory:  Positive for cough and wheezing (sometimes at night). Negative for chest tightness and shortness of breath.   Cardiovascular:  Negative for chest pain, palpitations and leg swelling.  Gastrointestinal: Negative.   Neurological: Negative.   Psychiatric/Behavioral: Negative.      Per HPI unless specifically indicated above     Objective:    There were no vitals taken for this visit.  Wt Readings from Last 3 Encounters:  04/26/23 166 lb 6.4  oz (75.5 kg)  03/12/23 165 lb 9.6 oz (75.1 kg)  02/15/23 170 lb (77.1 kg)    Physical Exam Vitals and nursing note reviewed.  Constitutional:      General: He is awake. He is not in acute distress.    Appearance: He is well-developed. He is not ill-appearing.  HENT:     Head: Normocephalic.     Right Ear: Hearing normal. No  drainage.     Left Ear: Hearing normal. No drainage.  Eyes:     General: Lids are normal.        Right eye: No discharge.        Left eye: No discharge.     Conjunctiva/sclera: Conjunctivae normal.  Pulmonary:     Effort: Pulmonary effort is normal. No accessory muscle usage or respiratory distress.  Musculoskeletal:     Cervical back: Normal range of motion.  Neurological:     Mental Status: He is alert and oriented to person, place, and time.  Psychiatric:        Mood and Affect: Mood normal.        Behavior: Behavior normal. Behavior is cooperative.        Thought Content: Thought content normal.        Judgment: Judgment normal.    Results for orders placed or performed in visit on 03/12/23  Bayer DCA Hb A1c Waived   Collection Time: 03/12/23  9:22 AM  Result Value Ref Range   HB A1C (BAYER DCA - WAIVED) 6.6 (H) 4.8 - 5.6 %  TSH   Collection Time: 03/12/23  9:27 AM  Result Value Ref Range   TSH 3.730 0.450 - 4.500 uIU/mL  Lipid Panel w/o Chol/HDL Ratio   Collection Time: 03/12/23  9:27 AM  Result Value Ref Range   Cholesterol, Total 112 100 - 199 mg/dL   Triglycerides 98 0 - 149 mg/dL   HDL 50 >16 mg/dL   VLDL Cholesterol Cal 18 5 - 40 mg/dL   LDL Chol Calc (NIH) 44 0 - 99 mg/dL  Comprehensive metabolic panel   Collection Time: 03/12/23  9:27 AM  Result Value Ref Range   Glucose 95 70 - 99 mg/dL   BUN 17 8 - 27 mg/dL   Creatinine, Ser 1.09 0.76 - 1.27 mg/dL   eGFR 79 >60 AV/WUJ/8.11   BUN/Creatinine Ratio 17 10 - 24   Sodium 140 134 - 144 mmol/L   Potassium 4.7 3.5 - 5.2 mmol/L   Chloride 100 96 - 106 mmol/L   CO2 24 20 - 29 mmol/L   Calcium 10.0 8.6 - 10.2 mg/dL   Total Protein 6.6 6.0 - 8.5 g/dL   Albumin 4.2 3.8 - 4.8 g/dL   Globulin, Total 2.4 1.5 - 4.5 g/dL   Bilirubin Total 0.5 0.0 - 1.2 mg/dL   Alkaline Phosphatase 129 (H) 44 - 121 IU/L   AST 12 0 - 40 IU/L   ALT 10 0 - 44 IU/L  CBC with Differential/Platelet   Collection Time: 03/12/23  9:27 AM   Result Value Ref Range   WBC 9.2 3.4 - 10.8 x10E3/uL   RBC 4.02 (L) 4.14 - 5.80 x10E6/uL   Hemoglobin 12.4 (L) 13.0 - 17.7 g/dL   Hematocrit 91.4 78.2 - 51.0 %   MCV 97 79 - 97 fL   MCH 30.8 26.6 - 33.0 pg   MCHC 32.0 31.5 - 35.7 g/dL   RDW 95.6 21.3 - 08.6 %   Platelets 264 150 - 450  x10E3/uL   Neutrophils 74 Not Estab. %   Lymphs 14 Not Estab. %   Monocytes 7 Not Estab. %   Eos 4 Not Estab. %   Basos 1 Not Estab. %   Neutrophils Absolute 6.7 1.4 - 7.0 x10E3/uL   Lymphocytes Absolute 1.3 0.7 - 3.1 x10E3/uL   Monocytes Absolute 0.7 0.1 - 0.9 x10E3/uL   EOS (ABSOLUTE) 0.4 0.0 - 0.4 x10E3/uL   Basophils Absolute 0.1 0.0 - 0.2 x10E3/uL   Immature Granulocytes 0 Not Estab. %   Immature Grans (Abs) 0.0 0.0 - 0.1 x10E3/uL      Assessment & Plan:   Problem List Items Addressed This Visit       Other   Subacute cough - Primary   Waxing and waning with URIs for 3 months. Higher risk due to diabetes and past smoker.  No benefit from recent Augmentin.  Will obtain CXR to further evaluate.  Start Prednisone 40 MG daily for 5 days, educated him on this and that sugars may trend up, to monitor closely.  Recommend he start taking Claritin 10 MG daily for 14 days.  Does not wish to obtain an inhaler. ?related to post nasal drip, PNA, or GERD.  Recommend: - Increased rest - Increasing Fluids - Acetaminophen as needed for fever/pain.  - Salt water gargling, chloraseptic spray and throat lozenges - OTC diabetic tussin - Mucinex.   - Determine next steps after imaging returns.      Relevant Orders   DG Chest 2 View     Follow up plan: Return for as scheduled 06/13/23.

## 2023-05-24 NOTE — Assessment & Plan Note (Signed)
Waxing and waning with URIs for 3 months. Higher risk due to diabetes and past smoker.  No benefit from recent Augmentin.  Will obtain CXR to further evaluate.  Start Prednisone 40 MG daily for 5 days, educated him on this and that sugars may trend up, to monitor closely.  Recommend he start taking Claritin 10 MG daily for 14 days.  Does not wish to obtain an inhaler. ?related to post nasal drip, PNA, or GERD.  Recommend: - Increased rest - Increasing Fluids - Acetaminophen as needed for fever/pain.  - Salt water gargling, chloraseptic spray and throat lozenges - OTC diabetic tussin - Mucinex.   - Determine next steps after imaging returns.

## 2023-05-24 NOTE — Progress Notes (Signed)
Contacted via MyChart   Good afternoon Johnny Wolfe, good news your imaging returned and looks normal.  Try Prednisone and allergy medication, we will follow-up as scheduled.  Any questions? Keep being amazing!!  Thank you for allowing me to participate in your care.  I appreciate you. Kindest regards, Briunna Leicht

## 2023-05-24 NOTE — Patient Instructions (Signed)

## 2023-06-09 NOTE — Patient Instructions (Signed)
Be Involved in Caring For Your Health:  Taking Medications When medications are taken as directed, they can greatly improve your health. But if they are not taken as prescribed, they may not work. In some cases, not taking them correctly can be harmful. To help ensure your treatment remains effective and safe, understand your medications and how to take them. Bring your medications to each visit for review by your provider.  Your lab results, notes, and after visit summary will be available on My Chart. We strongly encourage you to use this feature. If lab results are abnormal the clinic will contact you with the appropriate steps. If the clinic does not contact you assume the results are satisfactory. You can always view your results on My Chart. If you have questions regarding your health or results, please contact the clinic during office hours. You can also ask questions on My Chart.  We at [facility] are grateful that you chose [place] to provide your care. We strive to provide evidence-based and compassionate care and are always looking for feedback. If you get a survey from the clinic please complete this so we can hear your opinions.  Diabetes Mellitus and Foot Care Diabetes, also called diabetes mellitus, may cause problems with your feet and legs because of poor blood flow (circulation). Poor circulation may make your skin: Become thinner and drier. Break more easily. Heal more slowly. Peel and crack. You may also have nerve damage (neuropathy). This can cause decreased feeling in your legs and feet. This means that you may not notice minor injuries to your feet that could lead to more serious problems. Finding and treating problems early is the best way to prevent future foot problems. How to care for your feet Foot hygiene  Wash your feet daily with warm water and mild soap. Do not use hot water. Then, pat your feet and the areas between your toes until they are fully dry. Do  not soak your feet. This can dry your skin. Trim your toenails straight across. Do not dig under them or around the cuticle. File the edges of your nails with an emery board or nail file. Apply a moisturizing lotion or petroleum jelly to the skin on your feet and to dry, brittle toenails. Use lotion that does not contain alcohol and is unscented. Do not apply lotion between your toes. Shoes and socks Wear clean socks or stockings every day. Make sure they are not too tight. Do not wear knee-high stockings. These may decrease blood flow to your legs. Wear shoes that fit well and have enough cushioning. Always look in your shoes before you put them on to be sure there are no objects inside. To break in new shoes, wear them for just a few hours a day. This prevents injuries on your feet. Wounds, scrapes, corns, and calluses  Check your feet daily for blisters, cuts, bruises, sores, and redness. If you cannot see the bottom of your feet, use a mirror or ask someone for help. Do not cut off corns or calluses or try to remove them with medicine. If you find a minor scrape, cut, or break in the skin on your feet, keep it and the skin around it clean and dry. You may clean these areas with mild soap and water. Do not clean the area with peroxide, alcohol, or iodine. If you have a wound, scrape, corn, or callus on your foot, look at it several times a day to make sure it  is healing and not infected. Check for: Redness, swelling, or pain. Fluid or blood. Warmth. Pus or a bad smell. General tips Do not cross your legs. This may decrease blood flow to your feet. Do not use heating pads or hot water bottles on your feet. They may burn your skin. If you have lost feeling in your feet or legs, you may not know this is happening until it is too late. Protect your feet from hot and cold by wearing shoes, such as at the beach or on hot pavement. Schedule a complete foot exam at least once a year or more often if  you have foot problems. Report any cuts, sores, or bruises to your health care provider right away. Where to find more information American Diabetes Association: diabetes.org Association of Diabetes Care & Education Specialists: diabeteseducator.org Contact a health care provider if: You have a condition that increases your risk of infection, and you have any cuts, sores, or bruises on your feet. You have an injury that is not healing. You have redness on your legs or feet. You feel burning or tingling in your legs or feet. You have pain or cramps in your legs and feet. Your legs or feet are numb. Your feet always feel cold. You have pain around any toenails. Get help right away if: You have a wound, scrape, corn, or callus on your foot and: You have signs of infection. You have a fever. You have a red line going up your leg. This information is not intended to replace advice given to you by your health care provider. Make sure you discuss any questions you have with your health care provider. Document Revised: 10/18/2021 Document Reviewed: 10/18/2021 Elsevier Patient Education  2024 [employer].

## 2023-06-12 ENCOUNTER — Ambulatory Visit: Payer: Medicare HMO | Admitting: Nurse Practitioner

## 2023-06-12 DIAGNOSIS — E119 Type 2 diabetes mellitus without complications: Secondary | ICD-10-CM | POA: Diagnosis not present

## 2023-06-12 DIAGNOSIS — H35371 Puckering of macula, right eye: Secondary | ICD-10-CM | POA: Diagnosis not present

## 2023-06-12 DIAGNOSIS — H4321 Crystalline deposits in vitreous body, right eye: Secondary | ICD-10-CM | POA: Diagnosis not present

## 2023-06-12 DIAGNOSIS — H02889 Meibomian gland dysfunction of unspecified eye, unspecified eyelid: Secondary | ICD-10-CM | POA: Diagnosis not present

## 2023-06-12 LAB — HM DIABETES EYE EXAM

## 2023-06-13 ENCOUNTER — Encounter: Payer: Self-pay | Admitting: Nurse Practitioner

## 2023-06-13 ENCOUNTER — Ambulatory Visit: Payer: Self-pay | Admitting: Nurse Practitioner

## 2023-06-13 VITALS — BP 106/60 | HR 72 | Temp 97.7°F | Ht 72.0 in | Wt 167.4 lb

## 2023-06-13 DIAGNOSIS — I700 Atherosclerosis of aorta: Secondary | ICD-10-CM

## 2023-06-13 DIAGNOSIS — E875 Hyperkalemia: Secondary | ICD-10-CM | POA: Diagnosis not present

## 2023-06-13 DIAGNOSIS — I25119 Atherosclerotic heart disease of native coronary artery with unspecified angina pectoris: Secondary | ICD-10-CM

## 2023-06-13 DIAGNOSIS — E1159 Type 2 diabetes mellitus with other circulatory complications: Secondary | ICD-10-CM

## 2023-06-13 DIAGNOSIS — E785 Hyperlipidemia, unspecified: Secondary | ICD-10-CM

## 2023-06-13 DIAGNOSIS — I152 Hypertension secondary to endocrine disorders: Secondary | ICD-10-CM

## 2023-06-13 DIAGNOSIS — E1169 Type 2 diabetes mellitus with other specified complication: Secondary | ICD-10-CM

## 2023-06-13 DIAGNOSIS — E1165 Type 2 diabetes mellitus with hyperglycemia: Secondary | ICD-10-CM | POA: Diagnosis not present

## 2023-06-13 DIAGNOSIS — Z794 Long term (current) use of insulin: Secondary | ICD-10-CM

## 2023-06-13 LAB — BAYER DCA HB A1C WAIVED: HB A1C (BAYER DCA - WAIVED): 6.5 % — ABNORMAL HIGH (ref 4.8–5.6)

## 2023-06-13 LAB — MICROALBUMIN, URINE WAIVED
Creatinine, Urine Waived: 200 mg/dL (ref 10–300)
Microalb, Ur Waived: 80 mg/L — ABNORMAL HIGH (ref 0–19)

## 2023-06-13 NOTE — Progress Notes (Signed)
BP 106/60 (BP Location: Right Arm, Patient Position: Sitting, Cuff Size: Normal)   Pulse 72   Temp 97.7 F (36.5 C) (Oral)   Ht 6' (1.829 m)   Wt 167 lb 6.4 oz (75.9 kg)   SpO2 97%   BMI 22.70 kg/m    Subjective:    Patient ID: Johnny Wolfe, male    DOB: 28-Dec-1945, 78 y.o.   MRN: [id number]  HPI: Johnny Wolfe is a 78 y.o. male  Chief Complaint  Patient presents with   Diabetes   Hyperlipidemia   DIABETES November A1c 6.6%.  Taking Metformin 1000 MG BID and Lantus 16 units.     History: Glutamic acid <5.0 and C peptide 2.1 in 2021 on check -- ?Type 1 1/2 vs Type 2. In past tried GLP1 and SGLT2, but these were not beneficial -- he lost weight and felt increased fatigue with these medications.  Hypoglycemic episodes: this morning 60 Polydipsia/polyuria: no Visual disturbance: no Chest pain: no Paresthesias: no Glucose Monitoring: yes             Accucheck frequency: Daily             Fasting glucose: average a little over 100             Post prandial:              Evening:             Before meals: Taking Insulin?: no             Long acting insulin: Lantus 16 units             Short acting insulin: Blood Pressure Monitoring: daily Retinal Examination: Up to Date -- yesterday, [facility] Exam: Up to Date Pneumovax: Up to Date Influenza: Up to Date Aspirin: yes   HYPERTENSION / HYPERLIPIDEMIA Taking ASA and Metoprolol + Lipitor. Cardiology last seen on 02/15/23.  He has history of cardiac cath with stent to proximal RCA in April 2019.  Veltassa for hyperkalemia.  Work-up showed WNL labs and recent K+ stable.  Has never used NTG.   Satisfied with current treatment? yes Duration of hypertension: chronic BP monitoring frequency: not checking BP range: not checking BP medication side effects: no Duration of hyperlipidemia: chronic Cholesterol medication side effects: no Cholesterol supplements: none Medication compliance: good compliance Aspirin:  yes Recent stressors: no Recurrent headaches: no Visual changes: no Palpitations: no Dyspnea: no Chest pain: no Lower extremity edema: no Dizzy/lightheaded: no The ASCVD Risk score (Arnett DK, et al., 2019) failed to calculate for the following reasons:   The valid total cholesterol range is 130 to 320 mg/dL  Relevant past medical, surgical, family and social history reviewed and updated as indicated. Interim medical history since our last visit reviewed. Allergies and medications reviewed and updated.  Review of Systems  Constitutional:  Negative for activity change, diaphoresis, fatigue and fever.  Respiratory:  Negative for cough, chest tightness, shortness of breath and wheezing.   Cardiovascular:  Negative for chest pain, palpitations and leg swelling.  Gastrointestinal: Negative.   Endocrine: Negative for polydipsia, polyphagia and polyuria.  Neurological: Negative.   Psychiatric/Behavioral: Negative.     Per HPI unless specifically indicated above     Objective:    BP 106/60 (BP Location: Right Arm, Patient Position: Sitting, Cuff Size: Normal)   Pulse 72   Temp 97.7 F (36.5 C) (Oral)   Ht 6' (1.829 m)   Wt 167 lb 6.4 oz (75.9  kg)   SpO2 97%   BMI 22.70 kg/m   Wt Readings from Last 3 Encounters:  06/13/23 167 lb 6.4 oz (75.9 kg)  04/26/23 166 lb 6.4 oz (75.5 kg)  03/12/23 165 lb 9.6 oz (75.1 kg)    Physical Exam Vitals and nursing note reviewed.  Constitutional:      General: He is awake. He is not in acute distress.    Appearance: He is well-developed and well-groomed. He is not ill-appearing.  HENT:     Head: Normocephalic and atraumatic.     Right Ear: Hearing normal. No drainage.     Left Ear: Hearing normal. No drainage.  Eyes:     General: Lids are normal.        Right eye: No discharge.        Left eye: No discharge.     Conjunctiva/sclera: Conjunctivae normal.     Pupils: Pupils are equal, round, and reactive to light.  Neck:     Vascular: No  carotid bruit.  Cardiovascular:     Rate and Rhythm: Normal rate and regular rhythm.     Heart sounds: Normal heart sounds, S1 normal and S2 normal. No murmur heard.    No gallop.  Pulmonary:     Effort: Pulmonary effort is normal. No accessory muscle usage or respiratory distress.     Breath sounds: Normal breath sounds.  Abdominal:     General: Bowel sounds are normal.     Palpations: Abdomen is soft.  Musculoskeletal:        General: Normal range of motion.     Cervical back: Normal range of motion and neck supple.     Right lower leg: No edema.     Left lower leg: No edema.  Skin:    General: Skin is warm and dry.  Neurological:     Mental Status: He is alert and oriented to person, place, and time.  Psychiatric:        Attention and Perception: Attention normal.        Mood and Affect: Mood normal.        Speech: Speech normal.        Behavior: Behavior normal. Behavior is cooperative.        Thought Content: Thought content normal.    Results for orders placed or performed in visit on 03/12/23  Bayer DCA Hb A1c Waived   Collection Time: 03/12/23  9:22 AM  Result Value Ref Range   HB A1C (BAYER DCA - WAIVED) 6.6 (H) 4.8 - 5.6 %  TSH   Collection Time: 03/12/23  9:27 AM  Result Value Ref Range   TSH 3.730 0.450 - 4.500 uIU/mL  Lipid Panel w/o Chol/HDL Ratio   Collection Time: 03/12/23  9:27 AM  Result Value Ref Range   Cholesterol, Total 112 100 - 199 mg/dL   Triglycerides 98 0 - 149 mg/dL   HDL 50 >16 mg/dL   VLDL Cholesterol Cal 18 5 - 40 mg/dL   LDL Chol Calc (NIH) 44 0 - 99 mg/dL  Comprehensive metabolic panel   Collection Time: 03/12/23  9:27 AM  Result Value Ref Range   Glucose 95 70 - 99 mg/dL   BUN 17 8 - 27 mg/dL   Creatinine, Ser 1.09 0.76 - 1.27 mg/dL   eGFR 79 >60 AV/WUJ/8.11   BUN/Creatinine Ratio 17 10 - 24   Sodium 140 134 - 144 mmol/L   Potassium 4.7 3.5 - 5.2 mmol/L   Chloride 100  96 - 106 mmol/L   CO2 24 20 - 29 mmol/L   Calcium 10.0 8.6  - 10.2 mg/dL   Total Protein 6.6 6.0 - 8.5 g/dL   Albumin 4.2 3.8 - 4.8 g/dL   Globulin, Total 2.4 1.5 - 4.5 g/dL   Bilirubin Total 0.5 0.0 - 1.2 mg/dL   Alkaline Phosphatase 129 (H) 44 - 121 IU/L   AST 12 0 - 40 IU/L   ALT 10 0 - 44 IU/L  CBC with Differential/Platelet   Collection Time: 03/12/23  9:27 AM  Result Value Ref Range   WBC 9.2 3.4 - 10.8 x10E3/uL   RBC 4.02 (L) 4.14 - 5.80 x10E6/uL   Hemoglobin 12.4 (L) 13.0 - 17.7 g/dL   Hematocrit 16.1 09.6 - 51.0 %   MCV 97 79 - 97 fL   MCH 30.8 26.6 - 33.0 pg   MCHC 32.0 31.5 - 35.7 g/dL   RDW 04.5 40.9 - 81.1 %   Platelets 264 150 - 450 x10E3/uL   Neutrophils 74 Not Estab. %   Lymphs 14 Not Estab. %   Monocytes 7 Not Estab. %   Eos 4 Not Estab. %   Basos 1 Not Estab. %   Neutrophils Absolute 6.7 1.4 - 7.0 x10E3/uL   Lymphocytes Absolute 1.3 0.7 - 3.1 x10E3/uL   Monocytes Absolute 0.7 0.1 - 0.9 x10E3/uL   EOS (ABSOLUTE) 0.4 0.0 - 0.4 x10E3/uL   Basophils Absolute 0.1 0.0 - 0.2 x10E3/uL   Immature Granulocytes 0 Not Estab. %   Immature Grans (Abs) 0.0 0.0 - 0.1 x10E3/uL      Assessment & Plan:   Problem List Items Addressed This Visit       Cardiovascular and Mediastinum   Aortic atherosclerosis (HCC) (Chronic)   Chronic, stable.  Noted on past imaging, continue statin and ASA daily for prevention.      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  Continue current medication regimen and collaboration with cardiology.  Recent note reviewed.      Hypertension associated with diabetes (HCC) (Chronic)   Chronic, stable.  BP remains at goal for age.  Continue current medication regimen and collaboration with cardiology.  Adjust as needed.  Recommend he continue to monitor BP occasionally at home and document + focus on DASH diet.  LABS: CBC, CMP.  Return in 3 months.       Relevant Orders   Bayer DCA Hb A1c Waived    Microalbumin, Urine Waived     Endocrine   Hyperlipidemia associated with type 2 diabetes mellitus (HCC) (Chronic)   Chronic, stable.  Continue current medication regimen and adjust as needed.  Current LDL well controlled.  Lipid panel today.      Relevant Orders   Bayer DCA Hb A1c Waived   Comprehensive metabolic panel   Lipid Panel w/o Chol/HDL Ratio   Type 2 diabetes mellitus with hyperglycemia (HCC) - Primary (Chronic)   Chronic, stable with A1c 6.5% today, remaining at goal.  Urine ALB 80 (February 2025).  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  Continue to monitor BS at home and a post prandial check.  Return in 3 months. - Statin on board, no ACE/ARB due to ongoing hyperkalemia, even with low  dose - Eye and foot exams up to date - Vaccinations up to date      Relevant Orders   Bayer DCA Hb A1c Waived   Microalbumin, Urine Waived     Other   Hyperkalemia (Chronic)   Chronic, stable.  Stable levels with Veltassa on board.  Continue current medication regimen -- full work-up performed in April 2022 and all WNL.  Consider referral to nephrology or endocrinology as needed.  Discussed with patient.        Follow up plan: Return in about 3 months (around 09/10/2023) for T2DM, HTN/HLD, ANEMIA.

## 2023-06-13 NOTE — Assessment & Plan Note (Signed)
Chronic, stable.  Noted on past imaging, continue statin and ASA daily for prevention.

## 2023-06-13 NOTE — Assessment & Plan Note (Signed)
Chronic, stable with no recent NTG use.  Continue current medication regimen and collaboration with cardiology.  Recent note reviewed.

## 2023-06-13 NOTE — Assessment & Plan Note (Signed)
Chronic, stable with A1c 6.5% today, remaining at goal.  Urine ALB 80 (February 2025).  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  Continue to monitor BS at home and a post prandial check.  Return in 3 months. - Statin on board, no ACE/ARB due to ongoing hyperkalemia, even with low dose - Eye and foot exams up to date - Vaccinations up to date

## 2023-06-13 NOTE — Assessment & Plan Note (Signed)
Chronic, stable.  BP remains at goal for age.  Continue current medication regimen and collaboration with cardiology.  Adjust as needed.  Recommend he continue to monitor BP occasionally at home and document + focus on DASH diet.  LABS: CBC, CMP.  Return in 3 months.

## 2023-06-13 NOTE — Assessment & Plan Note (Signed)
Chronic, stable.  Continue current medication regimen and adjust as needed.  Current LDL well controlled.  Lipid panel today.

## 2023-06-13 NOTE — Assessment & Plan Note (Signed)
Chronic, stable.  Stable levels with Veltassa on board.  Continue current medication regimen -- full work-up performed in April 2022 and all WNL.  Consider referral to nephrology or endocrinology as needed.  Discussed with patient.

## 2023-06-14 ENCOUNTER — Encounter: Payer: Self-pay | Admitting: Nurse Practitioner

## 2023-06-14 LAB — COMPREHENSIVE METABOLIC PANEL
ALT: 10 [IU]/L (ref 0–44)
AST: 18 [IU]/L (ref 0–40)
Albumin: 4.5 g/dL (ref 3.8–4.8)
Alkaline Phosphatase: 111 [IU]/L (ref 44–121)
BUN/Creatinine Ratio: 15 (ref 10–24)
BUN: 13 mg/dL (ref 8–27)
Bilirubin Total: 0.6 mg/dL (ref 0.0–1.2)
CO2: 24 mmol/L (ref 20–29)
Calcium: 10.4 mg/dL — ABNORMAL HIGH (ref 8.6–10.2)
Chloride: 100 mmol/L (ref 96–106)
Creatinine, Ser: 0.84 mg/dL (ref 0.76–1.27)
Globulin, Total: 2.3 g/dL (ref 1.5–4.5)
Glucose: 70 mg/dL (ref 70–99)
Potassium: 4.7 mmol/L (ref 3.5–5.2)
Sodium: 140 mmol/L (ref 134–144)
Total Protein: 6.8 g/dL (ref 6.0–8.5)
eGFR: 91 mL/min/{1.73_m2} (ref >59)

## 2023-06-14 LAB — LIPID PANEL W/O CHOL/HDL RATIO
Cholesterol, Total: 148 mg/dL (ref 100–199)
HDL: 84 mg/dL (ref >39)
LDL Chol Calc (NIH): 49 mg/dL (ref 0–99)
Triglycerides: 79 mg/dL (ref 0–149)
VLDL Cholesterol Cal: 15 mg/dL (ref 5–40)

## 2023-06-14 NOTE — Progress Notes (Signed)
Contacted via MyChart   Good morning Sheffield, your labs have returned and overall look fantastic.  No concerns here.  Calcium very mildly elevated, but that could be related to diet.  Any questions? Keep being stellar!!  Thank you for allowing me to participate in your care.  I appreciate you. Kindest regards, Master Touchet

## 2023-07-04 DIAGNOSIS — D485 Neoplasm of uncertain behavior of skin: Secondary | ICD-10-CM | POA: Diagnosis not present

## 2023-07-04 DIAGNOSIS — C44329 Squamous cell carcinoma of skin of other parts of face: Secondary | ICD-10-CM | POA: Diagnosis not present

## 2023-07-04 DIAGNOSIS — L570 Actinic keratosis: Secondary | ICD-10-CM | POA: Diagnosis not present

## 2023-07-24 ENCOUNTER — Encounter: Payer: Self-pay | Admitting: Nurse Practitioner

## 2023-08-01 ENCOUNTER — Other Ambulatory Visit: Payer: Self-pay

## 2023-08-01 ENCOUNTER — Other Ambulatory Visit (HOSPITAL_COMMUNITY): Payer: Self-pay

## 2023-08-08 DIAGNOSIS — C44329 Squamous cell carcinoma of skin of other parts of face: Secondary | ICD-10-CM | POA: Diagnosis not present

## 2023-09-07 DIAGNOSIS — Z794 Long term (current) use of insulin: Secondary | ICD-10-CM | POA: Insufficient documentation

## 2023-09-07 NOTE — Patient Instructions (Signed)
Be Involved in Caring For Your Health:  Taking Medications When medications are taken as directed, they can greatly improve your health. But if they are not taken as prescribed, they may not work. In some cases, not taking them correctly can be harmful. To help ensure your treatment remains effective and safe, understand your medications and how to take them. Bring your medications to each visit for review by your provider.  Your lab results, notes, and after visit summary will be available on My Chart. We strongly encourage you to use this feature. If lab results are abnormal the clinic will contact you with the appropriate steps. If the clinic does not contact you assume the results are satisfactory. You can always view your results on My Chart. If you have questions regarding your health or results, please contact the clinic during office hours. You can also ask questions on My Chart.  We at [facility] are grateful that you chose [place] to provide your care. We strive to provide evidence-based and compassionate care and are always looking for feedback. If you get a survey from the clinic please complete this so we can hear your opinions.  Diabetes Mellitus and Exercise Regular exercise is important for your health, especially if you have diabetes mellitus. Exercise is not just about losing weight. It can also help you increase muscle strength and bone density and reduce body fat and stress. This can help your level of endurance and make you more fit and flexible. Why should I exercise if I have diabetes? Exercise has many benefits for people with diabetes. It can: Help lower and control your blood sugar (glucose). Help your body respond better and become more sensitive to the hormone insulin. Reduce how much insulin your body needs. Lower your risk for heart disease by: Lowering how much "bad" cholesterol and triglycerides you have in your body. Increasing how much "good" cholesterol  you have in your body. Lowering your blood pressure. Lowering your blood glucose levels. What is my activity plan? Your health care provider or an expert trained in diabetes care (certified diabetes educator) can help you make an activity plan. This plan can help you find the type of exercise that works for you. It may also tell you how often to exercise and for how long. Be sure to: Get at least 150 minutes of medium-intensity or high-intensity exercise each week. This may involve brisk walking, biking, or water aerobics. Do stretching and strengthening exercises at least 2 times a week. This may involve yoga or weight lifting. Spread out your activity over at least 3 days of the week. Get some form of physical activity each day. Do not go more than 2 days in a row without some kind of activity. Avoid being inactive for more than 30 minutes at a time. Take frequent breaks to walk or stretch. Choose activities that you enjoy. Set goals that you know you can accomplish. Start slowly and increase the intensity of your exercise over time. How do I manage my diabetes during exercise?  Monitor your blood glucose Check your blood glucose before and after you exercise. If your blood glucose is 240 mg/dL (40.9 mmol/L) or higher before you exercise, check your urine for ketones. These are chemicals created by the liver. If you have ketones in your urine, do not exercise until your blood glucose returns to normal. If your blood glucose is 100 mg/dL (5.6 mmol/L) or lower, eat a snack that has 15-20 grams of carbohydrate in  it. Check your blood glucose 15 minutes after the snack to make sure that your level is above 100 mg/dL (5.6 mmol/L) before you start to exercise. Your risk for low blood glucose (hypoglycemia) goes up during and after exercise. Know the symptoms of this condition and how to treat it. Follow these instructions at home: Keep a carbohydrate snack on hand for use before, during, and after  exercise. This can help prevent or treat hypoglycemia. Avoid injecting insulin into parts of your body that are going to be used during exercise. This may include: Your arms, when you are going to play tennis. Your legs, when you are about to go jogging. Keep track of your exercise habits. This can help you and your health care provider watch and adjust your activity plan. Write down: What you eat before and after you exercise. Blood glucose levels before and after you exercise. The type and amount of exercise you do. Talk to your health care provider before you start a new activity. They may need to: Make sure that the activity is safe for you. Adjust your insulin, other medicines, and food that you eat. Drink water while you exercise. This can stop you from losing too much water (dehydration). It can also prevent problems caused by having a lot of heat in your body (heat stroke). Where to find more information American Diabetes Association: diabetes.org Association of Diabetes Care & Education Specialists: diabeteseducator.org This information is not intended to replace advice given to you by your health care provider. Make sure you discuss any questions you have with your health care provider. Document Revised: 10/04/2021 Document Reviewed: 10/04/2021 Elsevier Patient Education  2024 [employer].

## 2023-09-12 ENCOUNTER — Encounter: Payer: Self-pay | Admitting: Nurse Practitioner

## 2023-09-12 ENCOUNTER — Ambulatory Visit (INDEPENDENT_AMBULATORY_CARE_PROVIDER_SITE_OTHER): Payer: PPO | Admitting: Nurse Practitioner

## 2023-09-12 VITALS — BP 114/59 | HR 75 | Temp 97.4°F | Ht 72.5 in | Wt 169.6 lb

## 2023-09-12 DIAGNOSIS — I700 Atherosclerosis of aorta: Secondary | ICD-10-CM | POA: Diagnosis not present

## 2023-09-12 DIAGNOSIS — E1169 Type 2 diabetes mellitus with other specified complication: Secondary | ICD-10-CM | POA: Diagnosis not present

## 2023-09-12 DIAGNOSIS — E1165 Type 2 diabetes mellitus with hyperglycemia: Secondary | ICD-10-CM

## 2023-09-12 DIAGNOSIS — Z794 Long term (current) use of insulin: Secondary | ICD-10-CM | POA: Diagnosis not present

## 2023-09-12 DIAGNOSIS — E875 Hyperkalemia: Secondary | ICD-10-CM | POA: Diagnosis not present

## 2023-09-12 DIAGNOSIS — E785 Hyperlipidemia, unspecified: Secondary | ICD-10-CM

## 2023-09-12 DIAGNOSIS — I25119 Atherosclerotic heart disease of native coronary artery with unspecified angina pectoris: Secondary | ICD-10-CM | POA: Diagnosis not present

## 2023-09-12 DIAGNOSIS — I152 Hypertension secondary to endocrine disorders: Secondary | ICD-10-CM | POA: Diagnosis not present

## 2023-09-12 DIAGNOSIS — E1159 Type 2 diabetes mellitus with other circulatory complications: Secondary | ICD-10-CM

## 2023-09-12 LAB — BAYER DCA HB A1C WAIVED: HB A1C (BAYER DCA - WAIVED): 6.2 % — ABNORMAL HIGH (ref 4.8–5.6)

## 2023-09-12 MED ORDER — ONETOUCH VERIO FLEX SYSTEM W/DEVICE KIT: PACK | 0 refills | Status: AC

## 2023-09-12 NOTE — Assessment & Plan Note (Signed)
Chronic, stable.  BP remains at goal for age.  Continue current medication regimen and collaboration with cardiology.  Adjust as needed.  Recommend he continue to monitor BP occasionally at home and document + focus on DASH diet.  LABS: BMP.  Return in 3 months.

## 2023-09-12 NOTE — Progress Notes (Signed)
 BP (!) 114/59   Pulse 75   Temp (!) 97.4 F (36.3 C) (Oral)   Ht 6' 0.5" (1.842 m)   Wt 169 lb 9.6 oz (76.9 kg)   SpO2 93%   BMI 22.69 kg/m    Subjective:    Patient ID: Johnny Wolfe, male    DOB: 01-Jun-1945, 78 y.o.   MRN: [id number]  HPI: Johnny Wolfe is a 78 y.o. male  Chief Complaint  Patient presents with   Anemia   Diabetes   Hyperlipidemia   Hypertension   DIABETES A1c 6.5% in February. Takes Metformin  1000 MG BID and Lantus  16 units.     History: Glutamic acid <5.0 and C peptide 2.1 in 2021 on check -- ?Type 1 1/2 vs Type 2. In past tried GLP1 and SGLT2, but these were not beneficial -- he lost weight and felt increased fatigue with these medications.  Hypoglycemic episodes: one or two times Polydipsia/polyuria: no Visual disturbance: no Chest pain: no Paresthesias: no Glucose Monitoring: yes             Accucheck frequency: Daily             Fasting glucose: averaging a little over 100             Post prandial:              Evening:             Before meals: Taking Insulin ?: no             Long acting insulin : Lantus  16 units             Short acting insulin : Blood Pressure Monitoring: daily Retinal Examination: Up to Date -- [facility] Exam: Up to Date Pneumovax: Up to Date Influenza: Up to Date Aspirin : yes   HYPERTENSION / HYPERLIPIDEMIA Continues ASA, Metoprolol  + Lipitor. Last visit with cardiology was on 02/15/23.  History of cardiac cath with stent to proximal RCA in April 2019.  Veltassa  for hyperkalemia.  Work-up showed WNL labs and recent K+ stable.  Has never used NTG.   Satisfied with current treatment? yes Duration of hypertension: chronic BP monitoring frequency: not checking BP range: not checking BP medication side effects: no Duration of hyperlipidemia: chronic Cholesterol medication side effects: no Cholesterol supplements: none Medication compliance: good compliance Aspirin : yes Recent stressors: no Recurrent  headaches: no Visual changes: no Palpitations: no Dyspnea: no Chest pain: no Lower extremity edema: no Dizzy/lightheaded: no The 10-year ASCVD risk score (Arnett DK, et al., 2019) is: 32.4%   Values used to calculate the score:     Age: 53 years     Sex: Male     Is Non-Hispanic African American: No     Diabetic: Yes     Tobacco smoker: No     Systolic Blood Pressure: 114 mmHg     Is BP treated: Yes     HDL Cholesterol: 84 mg/dL     Total Cholesterol: 148 mg/dL  Relevant past medical, surgical, family and social history reviewed and updated as indicated. Interim medical history since our last visit reviewed. Allergies and medications reviewed and updated.  Review of Systems  Constitutional:  Negative for activity change, diaphoresis, fatigue and fever.  Respiratory:  Negative for cough, chest tightness, shortness of breath and wheezing.   Cardiovascular:  Negative for chest pain, palpitations and leg swelling.  Gastrointestinal: Negative.   Endocrine: Negative for polydipsia, polyphagia and polyuria.  Neurological: Negative.  Psychiatric/Behavioral: Negative.     Per HPI unless specifically indicated above     Objective:    BP (!) 114/59   Pulse 75   Temp (!) 97.4 F (36.3 C) (Oral)   Ht 6' 0.5" (1.842 m)   Wt 169 lb 9.6 oz (76.9 kg)   SpO2 93%   BMI 22.69 kg/m   Wt Readings from Last 3 Encounters:  09/12/23 169 lb 9.6 oz (76.9 kg)  06/13/23 167 lb 6.4 oz (75.9 kg)  04/26/23 166 lb 6.4 oz (75.5 kg)    Physical Exam Vitals and nursing note reviewed.  Constitutional:      General: He is awake. He is not in acute distress.    Appearance: He is well-developed and well-groomed. He is not ill-appearing.  HENT:     Head: Normocephalic and atraumatic.     Right Ear: Hearing normal. No drainage.     Left Ear: Hearing normal. No drainage.  Eyes:     General: Lids are normal.        Right eye: No discharge.        Left eye: No discharge.     Conjunctiva/sclera:  Conjunctivae normal.     Pupils: Pupils are equal, round, and reactive to light.  Neck:     Vascular: No carotid bruit.  Cardiovascular:     Rate and Rhythm: Normal rate and regular rhythm.     Heart sounds: Normal heart sounds, S1 normal and S2 normal. No murmur heard.    No gallop.  Pulmonary:     Effort: Pulmonary effort is normal. No accessory muscle usage or respiratory distress.     Breath sounds: Normal breath sounds.  Abdominal:     General: Bowel sounds are normal.     Palpations: Abdomen is soft.  Musculoskeletal:        General: Normal range of motion.     Cervical back: Normal range of motion and neck supple.     Right lower leg: No edema.     Left lower leg: No edema.  Skin:    General: Skin is warm and dry.  Neurological:     Mental Status: He is alert and oriented to person, place, and time.  Psychiatric:        Attention and Perception: Attention normal.        Mood and Affect: Mood normal.        Speech: Speech normal.        Behavior: Behavior normal. Behavior is cooperative.        Thought Content: Thought content normal.    Results for orders placed or performed in visit on 07/24/23  [facility] DIABETES EYE EXAM   Collection Time: 06/12/23 12:00 AM  Result Value Ref Range   [facility] Diabetic Eye Exam No Retinopathy No Retinopathy      Assessment & Plan:   Problem List Items Addressed This Visit       Cardiovascular and Mediastinum   Hypertension associated with diabetes (HCC) (Chronic)   Chronic, stable.  BP remains at goal for age.  Continue current medication regimen and collaboration with cardiology.  Adjust as needed.  Recommend he continue to monitor BP occasionally at home and document + focus on DASH diet.  LABS: BMP.  Return in 3 months.       Relevant Orders   Bayer DCA Hb A1c Waived   Basic metabolic panel with GFR   Coronary artery disease involving native coronary artery with angina pectoris (HCC) (  Chronic)   Chronic, stable with no recent NTG  use.  Continue current medication regimen and collaboration with cardiology.  Recent note reviewed.      Aortic atherosclerosis (HCC) (Chronic)   Chronic, stable.  Noted on past imaging, continue statin and ASA daily for prevention.        Endocrine   Type 2 diabetes mellitus with hyperglycemia (HCC) - Primary (Chronic)   Chronic, stable with A1c 6.2% today, remaining well at goal.  Urine ALB 80 (February 2025).  Stable with Lantus  and Metformin  use, will most likely need to maintain on long term Lantus  as tolerates this well -- he is aware to reduce dosing if BS too low consistently, suspect more Type 1.5 diabetic.  Continue to monitor BS at home and a post prandial check.  Return in 3 months. - Statin on board, no ACE/ARB due to ongoing hyperkalemia, even with low dose - Eye and foot exams up to date - Vaccinations up to date      Relevant Orders   Bayer DCA Hb A1c Waived   Hyperlipidemia associated with type 2 diabetes mellitus (HCC) (Chronic)   Chronic, stable.  Continue current medication regimen and adjust as needed.  Current LDL well controlled.  Lipid panel today.      Relevant Orders   Bayer DCA Hb A1c Waived   Lipid Panel w/o Chol/HDL Ratio     Other   Hyperkalemia (Chronic)   Chronic, stable.  Stable levels with Veltassa  on board.  Continue current medication regimen -- full work-up performed in April 2022 and all WNL.  Consider referral to nephrology or endocrinology as needed.  Discussed with patient.      Insulin  long-term use (HCC)   Refer to diabetes with hyperglycemia plan of care.      Relevant Orders   Bayer DCA Hb A1c Waived     Follow up plan: Return in about 3 months (around 12/13/2023) for T2DM, HTN/HLD + needs Medicare Wellness scheduled with nurse please.

## 2023-09-12 NOTE — Assessment & Plan Note (Signed)
 Chronic, stable with A1c 6.2% today, remaining well at goal.  Urine ALB 80 (February 2025).  Stable with Lantus  and Metformin  use, will most likely need to maintain on long term Lantus  as tolerates this well -- he is aware to reduce dosing if BS too low consistently, suspect more Type 1.5 diabetic.  Continue to monitor BS at home and a post prandial check.  Return in 3 months. - Statin on board, no ACE/ARB due to ongoing hyperkalemia, even with low dose - Eye and foot exams up to date - Vaccinations up to date

## 2023-09-12 NOTE — Assessment & Plan Note (Signed)
 Chronic, stable.  Continue current medication regimen and adjust as needed.  Current LDL well controlled.  Lipid panel today.

## 2023-09-12 NOTE — Assessment & Plan Note (Signed)
 Chronic, stable.  Stable levels with Veltassa on board.  Continue current medication regimen -- full work-up performed in April 2022 and all WNL.  Consider referral to nephrology or endocrinology as needed.  Discussed with patient.

## 2023-09-12 NOTE — Assessment & Plan Note (Signed)
 Chronic, stable with no recent NTG use.  Continue current medication regimen and collaboration with cardiology.  Recent note reviewed.

## 2023-09-12 NOTE — Assessment & Plan Note (Signed)
 Chronic, stable.  Noted on past imaging, continue statin and ASA daily for prevention.

## 2023-09-12 NOTE — Assessment & Plan Note (Signed)
Refer to diabetes with hyperglycemia plan of care.

## 2023-09-13 ENCOUNTER — Ambulatory Visit: Payer: Self-pay | Admitting: Nurse Practitioner

## 2023-09-13 LAB — BASIC METABOLIC PANEL WITH GFR
BUN/Creatinine Ratio: 20 (ref 10–24)
BUN: 18 mg/dL (ref 8–27)
CO2: 21 mmol/L (ref 20–29)
Calcium: 10.1 mg/dL (ref 8.6–10.2)
Chloride: 103 mmol/L (ref 96–106)
Creatinine, Ser: 0.92 mg/dL (ref 0.76–1.27)
Glucose: 107 mg/dL — ABNORMAL HIGH (ref 70–99)
Potassium: 4.9 mmol/L (ref 3.5–5.2)
Sodium: 140 mmol/L (ref 134–144)
eGFR: 87 mL/min/{1.73_m2} (ref >59)

## 2023-09-13 LAB — LIPID PANEL W/O CHOL/HDL RATIO
Cholesterol, Total: 134 mg/dL (ref 100–199)
HDL: 83 mg/dL (ref >39)
LDL Chol Calc (NIH): 40 mg/dL (ref 0–99)
Triglycerides: 49 mg/dL (ref 0–149)
VLDL Cholesterol Cal: 11 mg/dL (ref 5–40)

## 2023-09-13 NOTE — Progress Notes (Signed)
 Contacted via MyChart   Good morning Johnny Wolfe, labs have returned and overall are stable with no changes needed.  Any questions? Keep being amazing!!  Thank you for allowing me to participate in your care.  I appreciate you. Kindest regards, Revere Maahs

## 2023-10-16 ENCOUNTER — Other Ambulatory Visit: Payer: Self-pay

## 2023-10-16 ENCOUNTER — Other Ambulatory Visit (HOSPITAL_COMMUNITY): Payer: Self-pay

## 2023-10-17 ENCOUNTER — Encounter: Payer: Self-pay | Admitting: Pharmacist

## 2023-10-17 ENCOUNTER — Other Ambulatory Visit (HOSPITAL_COMMUNITY): Payer: Self-pay

## 2023-10-17 ENCOUNTER — Other Ambulatory Visit: Payer: Self-pay

## 2023-10-18 ENCOUNTER — Other Ambulatory Visit: Payer: Self-pay

## 2023-10-30 ENCOUNTER — Other Ambulatory Visit (HOSPITAL_COMMUNITY): Payer: Self-pay

## 2023-12-06 ENCOUNTER — Other Ambulatory Visit (HOSPITAL_COMMUNITY): Payer: Self-pay

## 2023-12-14 NOTE — Patient Instructions (Signed)

## 2023-12-16 ENCOUNTER — Ambulatory Visit: Admitting: Nurse Practitioner

## 2023-12-16 ENCOUNTER — Encounter: Payer: Self-pay | Admitting: Nurse Practitioner

## 2023-12-16 VITALS — BP 109/60 | HR 67 | Temp 97.9°F | Ht 72.2 in | Wt 169.8 lb

## 2023-12-16 DIAGNOSIS — I25119 Atherosclerotic heart disease of native coronary artery with unspecified angina pectoris: Secondary | ICD-10-CM | POA: Diagnosis not present

## 2023-12-16 DIAGNOSIS — Z794 Long term (current) use of insulin: Secondary | ICD-10-CM | POA: Diagnosis not present

## 2023-12-16 DIAGNOSIS — E1169 Type 2 diabetes mellitus with other specified complication: Secondary | ICD-10-CM

## 2023-12-16 DIAGNOSIS — E1159 Type 2 diabetes mellitus with other circulatory complications: Secondary | ICD-10-CM | POA: Diagnosis not present

## 2023-12-16 DIAGNOSIS — I700 Atherosclerosis of aorta: Secondary | ICD-10-CM | POA: Diagnosis not present

## 2023-12-16 DIAGNOSIS — E785 Hyperlipidemia, unspecified: Secondary | ICD-10-CM

## 2023-12-16 DIAGNOSIS — I152 Hypertension secondary to endocrine disorders: Secondary | ICD-10-CM

## 2023-12-16 DIAGNOSIS — Z0000 Encounter for general adult medical examination without abnormal findings: Secondary | ICD-10-CM

## 2023-12-16 DIAGNOSIS — E875 Hyperkalemia: Secondary | ICD-10-CM | POA: Diagnosis not present

## 2023-12-16 DIAGNOSIS — E1165 Type 2 diabetes mellitus with hyperglycemia: Secondary | ICD-10-CM

## 2023-12-16 LAB — BAYER DCA HB A1C WAIVED: HB A1C (BAYER DCA - WAIVED): 6.3 % — ABNORMAL HIGH (ref 4.8–5.6)

## 2023-12-16 NOTE — Assessment & Plan Note (Signed)
 Recent levels have been 4.7 to 4.9.  Will trial over next three months coming off of Veltassa  and see if levels remain stable without it.  Would avoid restarting ACE/ARB due to elevations in K+ with these.

## 2023-12-16 NOTE — Assessment & Plan Note (Signed)
 Chronic, stable with A1c 6.3% today, remaining well at goal.  Urine ALB 80 (February 2025).  Stable with Lantus  and Metformin  use, will most likely need to maintain on long term Lantus  as tolerates this well -- he is aware to reduce dosing if BS too low consistently, suspect more Type 1.5 diabetic.  Continue to monitor BS at home and a post prandial check.  Return in 3 months. - Statin on board, no ACE/ARB due to ongoing hyperkalemia, even with low dose - Eye and foot exams up to date - Vaccinations up to date

## 2023-12-16 NOTE — Assessment & Plan Note (Signed)
 Chronic, stable with no recent NTG use.  Continue current medication regimen and collaboration with cardiology.  Recent note reviewed.

## 2023-12-16 NOTE — Progress Notes (Signed)
 BP 109/60   Pulse 67   Temp 97.9 F (36.6 C) (Oral)   Ht 6' 0.2 (1.834 m)   Wt 169 lb 12.8 oz (77 kg)   SpO2 94%   BMI 22.90 kg/m    Subjective:    Patient ID: Johnny Wolfe, male    DOB: 14-Jun-1945, 78 y.o.   MRN: [id number]  HPI: Johnny Wolfe is a 78 y.o. male  Chief Complaint  Patient presents with   Diabetes   Hyperlipidemia   Hypertension   DIABETES May A1c was 6.2%, remaining stable. Continues Metformin  1000 MG BID and Lantus  16 units.     History: Glutamic acid <5.0 and C peptide 2.1 in 2021 on check -- ?Type 1 1/2 vs Type 2. In past tried GLP1 and SGLT2, but these were not beneficial -- he lost weight and felt increased fatigue with these medications.  Hypoglycemic episodes: has had 2-3 below 70, none below 60 Polydipsia/polyuria: no Visual disturbance: no Chest pain: no Paresthesias: no Glucose Monitoring: yes             Accucheck frequency: Daily             Fasting glucose: around 100 range at home             Post prandial:              Evening:             Before meals: Taking Insulin ?: no             Long acting insulin : Lantus  16 units             Short acting insulin : Blood Pressure Monitoring: daily Retinal Examination: Up to Date -- [place] [facility] Foot Exam: Up to Date Pneumovax: Up to Date Influenza: Up to Date Aspirin : yes   HYPERTENSION / HYPERLIPIDEMIA Takes ASA, Metoprolol  + Lipitor. History of cardiac cath with stent to proximal RCA in April 2019.  Veltassa  for hyperkalemia.  Work-up showed WNL labs and recent K+ stable.  Has never used NTG.  Cardiology last seen 02/15/23. Satisfied with current treatment? yes Duration of hypertension: chronic BP monitoring frequency: not checking BP range: not checking BP medication side effects: no Duration of hyperlipidemia: chronic Cholesterol medication side effects: no Cholesterol supplements: none Medication compliance: good compliance Aspirin : yes Recent stressors: no Recurrent headaches:  no Visual changes: no Palpitations: no Dyspnea: no Chest pain: no Lower extremity edema: no Dizzy/lightheaded: no The 10-year ASCVD risk score (Arnett DK, et al., 2019) is: 29.6%   Values used to calculate the score:     Age: 61 years     Clincally relevant sex: Male     Is Non-Hispanic African American: No     Diabetic: Yes     Tobacco smoker: No     Systolic Blood Pressure: 109 mmHg     Is BP treated: Yes     HDL Cholesterol: 83 mg/dL     Total Cholesterol: 134 mg/dL   Relevant past medical, surgical, family and social history reviewed and updated as indicated. Interim medical history since our last visit reviewed. Allergies and medications reviewed and updated.  Review of Systems  Constitutional:  Negative for activity change, diaphoresis, fatigue and fever.  Respiratory:  Negative for cough, chest tightness, shortness of breath and wheezing.   Cardiovascular:  Negative for chest pain, palpitations and leg swelling.  Gastrointestinal: Negative.   Endocrine: Negative for polydipsia, polyphagia and polyuria.  Neurological: Negative.   Psychiatric/Behavioral:  Negative.      Per HPI unless specifically indicated above     Objective:    BP 109/60   Pulse 67   Temp 97.9 F (36.6 C) (Oral)   Ht 6' 0.2 (1.834 m)   Wt 169 lb 12.8 oz (77 kg)   SpO2 94%   BMI 22.90 kg/m   Wt Readings from Last 3 Encounters:  12/16/23 169 lb 12.8 oz (77 kg)  09/12/23 169 lb 9.6 oz (76.9 kg)  06/13/23 167 lb 6.4 oz (75.9 kg)    Physical Exam Vitals and nursing note reviewed.  Constitutional:      General: He is awake. He is not in acute distress.    Appearance: He is well-developed and well-groomed. He is not ill-appearing.  HENT:     Head: Normocephalic and atraumatic.     Right Ear: Hearing normal. No drainage.     Left Ear: Hearing normal. No drainage.  Eyes:     General: Lids are normal.        Right eye: No discharge.        Left eye: No discharge.     Conjunctiva/sclera:  Conjunctivae normal.     Pupils: Pupils are equal, round, and reactive to light.  Neck:     Vascular: No carotid bruit.  Cardiovascular:     Rate and Rhythm: Normal rate and regular rhythm.     Heart sounds: Normal heart sounds, S1 normal and S2 normal. No murmur heard.    No gallop.  Pulmonary:     Effort: Pulmonary effort is normal. No accessory muscle usage or respiratory distress.     Breath sounds: Normal breath sounds.  Abdominal:     General: Bowel sounds are normal.     Palpations: Abdomen is soft.  Musculoskeletal:        General: Normal range of motion.     Cervical back: Normal range of motion and neck supple.     Right lower leg: No edema.     Left lower leg: No edema.  Skin:    General: Skin is warm and dry.  Neurological:     Mental Status: He is alert and oriented to person, place, and time.  Psychiatric:        Attention and Perception: Attention normal.        Mood and Affect: Mood normal.        Speech: Speech normal.        Behavior: Behavior normal. Behavior is cooperative.        Thought Content: Thought content normal.    Diabetic Foot Exam - Simple   Simple Foot Form Visual Inspection No deformities, no ulcerations, no other skin breakdown bilaterally: Yes Sensation Testing Intact to touch and monofilament testing bilaterally: Yes Pulse Check Posterior Tibialis and Dorsalis pulse intact bilaterally: Yes Comments      Results for orders placed or performed in visit on 09/12/23  Bayer DCA Hb A1c Waived   Collection Time: 09/12/23 10:26 AM  Result Value Ref Range   HB A1C (BAYER DCA - WAIVED) 6.2 (H) 4.8 - 5.6 %  Basic metabolic panel with GFR   Collection Time: 09/12/23 10:26 AM  Result Value Ref Range   Glucose 107 (H) 70 - 99 mg/dL   BUN 18 8 - 27 mg/dL   Creatinine, Ser 9.07 0.76 - 1.27 mg/dL   eGFR 87 >40 fO/fpw/8.26   BUN/Creatinine Ratio 20 10 - 24   Sodium 140 134 - 144 mmol/L  Potassium 4.9 3.5 - 5.2 mmol/L   Chloride 103 96 -  106 mmol/L   CO2 21 20 - 29 mmol/L   Calcium  10.1 8.6 - 10.2 mg/dL  Lipid Panel w/o Chol/HDL Ratio   Collection Time: 09/12/23 10:26 AM  Result Value Ref Range   Cholesterol, Total 134 100 - 199 mg/dL   Triglycerides 49 0 - 149 mg/dL   HDL 83 >60 mg/dL   VLDL Cholesterol Cal 11 5 - 40 mg/dL   LDL Chol Calc (NIH) 40 0 - 99 mg/dL      Assessment & Plan:   Problem List Items Addressed This Visit       Cardiovascular and Mediastinum   Hypertension associated with diabetes (HCC) (Chronic)   Chronic, stable.  BP remains at goal for age.  Continue current medication regimen and collaboration with cardiology.  Adjust as needed.  Recommend he continue to monitor BP occasionally at home and document + focus on DASH diet.  LABS: BMP.  Return in 3 months.       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  Continue current medication regimen and collaboration with cardiology.  Recent note reviewed.      Aortic atherosclerosis (HCC) (Chronic)   Chronic, stable.  Noted on past imaging, continue statin and ASA daily for prevention.        Endocrine   Type 2 diabetes mellitus with hyperglycemia (HCC) (Chronic)   Chronic, stable with A1c 6.3% today, remaining well at goal.  Urine ALB 80 (February 2025).  Stable with Lantus  and Metformin  use, will most likely need to maintain on long term Lantus  as tolerates this well -- he is aware to reduce dosing if BS too low consistently, suspect more Type 1.5 diabetic.  Continue to monitor BS at home and a post prandial check.  Return in 3 months. - Statin on board, no ACE/ARB due to ongoing hyperkalemia, even with low dose - Eye and foot exams up to date - Vaccinations up to date      Relevant Orders   Basic metabolic panel with GFR   Bayer DCA Hb A1c Waived   Hyperlipidemia  associated with type 2 diabetes mellitus (HCC) (Chronic)   Chronic, stable.  Continue current medication regimen and adjust as needed.  Current LDL well controlled.  Lipid panel today.      Relevant Orders   Bayer DCA Hb A1c Waived   Lipid Panel w/o Chol/HDL Ratio     Other   Hyperkalemia (Chronic)   Recent levels have been 4.7 to 4.9.  Will trial over next three months coming off of Veltassa  and see if levels remain stable without it.  Would avoid restarting ACE/ARB due to elevations in K+ with these.      Insulin  long-term use (HCC)   Refer to diabetes with hyperglycemia plan of care.      Relevant Orders   Basic metabolic panel with GFR   Bayer DCA Hb A1c Waived   Other Visit Diagnoses       Encounter for Medicare annual wellness exam    -  Primary        Follow up plan: Return in about 3 months (around 03/17/2024) for Annual Physical after 03/11/24.

## 2023-12-16 NOTE — Progress Notes (Signed)
 Subjective:   Johnny Wolfe is a 77 y.o. male who presents for Medicare Annual/Subsequent preventive examination.  Visit Complete: In person  Patient Medicare AWV questionnaire was completed by the patient on 12/16/23; I have confirmed that all information answered by patient is correct and no changes since this date.  Cardiac Risk Factors include: advanced age (>76men, >13 women);diabetes mellitus;hypertension;male gender     Objective:    Today's Vitals   12/16/23 1015 12/16/23 1020  BP: 109/60   Pulse: 67   Temp: 97.9 F (36.6 C)   TempSrc: Oral   SpO2: 94%   Weight: 169 lb 12.8 oz (77 kg)   Height: 6' 0.2 (1.834 m)   PainSc: 0-No pain 0-No pain   Body mass index is 22.9 kg/m.     12/16/2023   10:27 AM 03/13/2021   12:07 PM 03/11/2020    1:03 PM 05/12/2019    3:23 AM 03/05/2019    1:01 PM 11/04/2017   10:38 AM 08/02/2017    3:26 PM  Advanced Directives  Does Patient Have a Medical Advance Directive? Yes Yes Yes No Yes Yes    Type of Advance Directive Living will;Healthcare Power of [employer] Power of [employer] Power of [place];Living will  Living will;Healthcare Power of Attorney Living will;Healthcare Power of Attorney   Does patient want to make changes to medical advance directive? No - Patient declined        Copy of Healthcare Power of Attorney in Chart? No - copy requested Yes - validated most recent copy scanned in chart (See row information) Yes - validated most recent copy scanned in chart (See row information)  Yes - validated most recent copy scanned in chart (See row information) No - copy requested    Would patient like information on creating a medical advance directive?    No - Patient declined   No - Patient declined      Data saved with a previous flowsheet row definition    Current Medications (verified) Outpatient Encounter Medications as of 12/16/2023  Medication Sig   aspirin  EC 81 MG tablet Take 81 mg by mouth daily.    atorvastatin  (LIPITOR) 40 MG tablet Take 1 tablet (40 mg total) by mouth daily.   Blood Glucose Monitoring Suppl (ONETOUCH VERIO FLEX SYSTEM) w/Device KIT To check blood sugar 2 to 3 times daily.   glucose blood (ONETOUCH VERIO) test strip USE TO CHECK BLOOD SUGAR 2-3 TIMES PER DAY AS DIRECTED   insulin  glargine (LANTUS  SOLOSTAR) 100 UNIT/ML Solostar Pen INJECT SUBCUTANEOUSLY 16  UNITS DAILY   Lancet Device MISC 1 Units by Does not apply route daily as needed.   Lancets (ONETOUCH DELICA PLUS LANCET33G) MISC USE TO CHECK BLOOD SUGAR 2-3 TIMES DAILY   magnesium  oxide (MAG-OX) 400 (240 Mg) MG tablet TAKE 1 TABLET BY MOUTH TWICE DAILY   metFORMIN  (GLUCOPHAGE ) 1000 MG tablet TAKE 1 TABLET BY MOUTH TWICE DAILY WITH MEALS.   metoprolol  tartrate (LOPRESSOR ) 25 MG tablet Take 0.5 tablets (12.5 mg total) by mouth 2 (two) times daily.   nitroGLYCERIN  (NITROSTAT ) 0.4 MG SL tablet Place 1 tablet (0.4 mg total) under the tongue every 5 (five) minutes as needed for chest pain.   patiromer  (VELTASSA ) 8.4 g packet TAKE THE CONTENTS OF 1 PACKET DAILY AS DIRECTED   vitamin B-12 (CYANOCOBALAMIN) 500 MCG tablet Take 500 mcg by mouth daily.   No facility-administered encounter medications on file as of 12/16/2023.    Allergies (verified) Neosporin [bacitracin -polymyxin b],  Ace inhibitors, and Neosporin [neomycin -bacitracin  zn-polymyx]   History: Past Medical History:  Diagnosis Date   Allergy    CAD (coronary artery disease)    a. 06/2017 Cardiac CT: Ca2+ score of 2310 - 95th%'ile;  b. 07/2017 Cath/PCI: LM nl, LAD 60p, 62m, LCX 60p, OM2 40p, RCA dominant, 80p (3.5x15 [place] DES), 23m, 30d, EF 55-65%.   Cataract    Diabetes mellitus without complication [facility])    Essential hypertension    Hyperlipidemia    Syncope    Wears dentures    full upper and lower   Past Surgical History:  Procedure Laterality Date   APPENDECTOMY     CARDIAC CATHETERIZATION     CATARACT EXTRACTION, BILATERAL     August 2017    CORONARY STENT INTERVENTION N/A 08/02/2017   Procedure: CORONARY STENT INTERVENTION;  Surgeon: Darron Deatrice LABOR, MD;  Location: [facility] INVASIVE CV LAB;  Service: Cardiovascular;  Laterality: N/A;   LEFT HEART CATH AND CORONARY ANGIOGRAPHY Left 08/02/2017   Procedure: LEFT HEART CATH AND CORONARY ANGIOGRAPHY;  Surgeon: Perla Evalene PARAS, MD;  Location: [facility] INVASIVE CV LAB;  Service: Cardiovascular;  Laterality: Left;   MASS EXCISION Left 07/04/2017   Procedure: REMOVAL TWO CYST EAR EAR LOBE AND EXTERNAL EAR;  Surgeon: Herminio Miu, MD;  Location: [facility];  Service: ENT;  Laterality: Left;  LOCAL Diabetic - insulin  and oral meds   MOHS SURGERY     head   upper jaw Right 06/2019   at [facility] History  Problem Relation Age of Onset   Diabetes Mother    Arrhythmia Mother 46       Pacemaker implant    Cancer Father        lung   Lung cancer Sister    Cancer Sister    Kidney cancer Sister    Social History   Socioeconomic History   Marital status: Married    Spouse name: Not on file   Number of children: Not on file   Years of education: Not on file   Highest education level: Associate degree: occupational, Scientist, product/process development, or vocational program  Occupational History   Not on file  Tobacco Use   Smoking status: Former    Current packs/day: 0.00    Average packs/day: 1 pack/day for 45.0 years (45.0 ttl pk-yrs)    Types: Cigarettes    Quit date: 10/27/2011    Years since quitting: 12.1   Smokeless tobacco: Never  Vaping Use   Vaping status: Never Used  Substance and Sexual Activity   Alcohol use: Yes    Alcohol/week: 7.0 standard drinks of alcohol    Types: 7 Shots of liquor per week    Comment: weekly   Drug use: Never   Sexual activity: Not Currently  Other Topics Concern   Not on file  Social History Narrative   Not on file   Social Drivers of Health   Financial Resource Strain: Low Risk  (12/12/2023)   Overall Financial Resource Strain (CARDIA)     Difficulty of Paying Living Expenses: Not hard at all  Food Insecurity: No Food Insecurity (12/12/2023)   Hunger Vital Sign    Worried About Running Out of Food in the Last Year: Never true    Ran Out of Food in the Last Year: Never true  Transportation Needs: No Transportation Needs (12/12/2023)   PRAPARE - Administrator, Civil Service (Medical): No    Lack of Transportation (Non-Medical): No  Physical  Activity: Insufficiently Active (12/12/2023)   Exercise Vital Sign    Days of Exercise per Week: 1 day    Minutes of Exercise per Session: 30 min  Stress: No Stress Concern Present (12/12/2023)   [employer] of Occupational Health - Occupational Stress Questionnaire    Feeling of Stress: Not at all  Social Connections: Moderately Isolated (12/12/2023)   Social Connection and Isolation Panel    Frequency of Communication with Friends and Family: More than three times a week    Frequency of Social Gatherings with Friends and Family: Twice a week    Attends Religious Services: Never    Database administrator or Organizations: No    Attends Engineer, structural: Not on file    Marital Status: Married    Tobacco Counseling Counseling given: Not Answered   Clinical Intake:  Pre-visit preparation completed: Yes  Pain : No/denies pain Pain Score: 0-No pain     BMI - recorded: 22.9 Nutritional Status: BMI of 19-24  Normal Nutritional Risks: None Diabetes: Yes CBG done?: Yes CBG resulted in Enter/ Edit results?: No Did pt. bring in CBG monitor from home?: No  How often do you need to have someone help you when you read instructions, pamphlets, or other written materials from your doctor or pharmacy?: 1 - Never  Interpreter Needed?: No  Information entered by :: Laymon Metro, CMA   Activities of Daily Living    12/16/2023   10:21 AM 03/12/2023    1:14 PM  In your present state of health, do you have any difficulty performing the following  activities:  Hearing? 0 0  Vision? 0 0  Difficulty concentrating or making decisions? 0 0  Walking or climbing stairs? 0 0  Dressing or bathing? 0 0  Doing errands, shopping? 0 0  Preparing Food and eating ? N   Using the Toilet? N   In the past six months, have you accidently leaked urine? N   Do you have problems with loss of bowel control? N   Managing your Medications? N   Managing your Finances? N   Housekeeping or managing your Housekeeping? N     Patient Care Team: Jaxten Brosh T, NP as PCP - General (Nurse Practitioner) Gollan, Timothy J, MD as PCP - Cardiology (Cardiology)  Indicate any recent Medical Services you may have received from other than [facility] providers in the past year (date may be approximate).     Assessment:   This is a routine wellness examination for Freddy.  Hearing/Vision screen No results found.   Goals Addressed   None    Depression Screen    12/16/2023   10:18 AM 09/12/2023   10:27 AM 03/12/2023    1:06 PM 12/07/2022    1:10 PM 09/05/2022   10:25 AM 06/07/2022   11:25 AM 03/07/2022    2:13 PM  PHQ 2/9 Scores  PHQ - 2 Score 0 0 0 0 0 0 0  PHQ- 9 Score 0 0 2 0 0 0 0    Fall Risk    12/16/2023   10:17 AM 09/12/2023   10:27 AM 03/12/2023    1:05 PM 12/07/2022    1:10 PM 09/05/2022   10:25 AM  Fall Risk   Falls in the past year? 0 0 0 0 0  Number falls in past yr: 0 0 0 0 0  Injury with Fall? 0 0 0 0 0  Risk for fall due to : No Fall Risks No  Fall Risks No Fall Risks No Fall Risks No Fall Risks  Follow up Falls evaluation completed Falls evaluation completed Falls evaluation completed Falls evaluation completed Falls evaluation completed    MEDICARE RISK AT HOME: Medicare Risk at Home Any stairs in or around the home?: Yes If so, are there any without handrails?: No Home free of loose throw rugs in walkways, pet beds, electrical cords, etc?: Yes Adequate lighting in your home to reduce risk of falls?: Yes Life alert?: No Use of a cane,  walker or w/c?: No Grab bars in the bathroom?: Yes Shower chair or bench in shower?: No Elevated toilet seat or a handicapped toilet?: No  TIMED UP AND GO:  Was the test performed?  Yes  Length of time to ambulate 10 feet: 3 sec Gait steady and fast without use of assistive device    Cognitive Function:        12/16/2023   10:23 AM 06/07/2022   11:37 AM 03/07/2022    2:46 PM 02/23/2021    8:41 AM 03/11/2020    1:06 PM  6CIT Screen  What Year? 0 points 0 points 0 points 0 points 0 points  What month? 0 points 0 points 0 points 0 points 0 points  What time? 0 points 0 points 0 points 0 points 0 points  Count back from 20 0 points 0 points 0 points 0 points 0 points  Months in reverse 0 points 0 points 0 points 0 points 0 points  Repeat phrase 0 points 0 points 0 points 2 points 2 points  Total Score 0 points 0 points 0 points 2 points 2 points    Immunizations Immunization History  Administered Date(s) Administered   Fluad Quad(high Dose 65+) 01/28/2019, 02/16/2020   Influenza, High Dose Seasonal PF 05/10/2016, 03/15/2017, 01/07/2018, 02/06/2023   Influenza-Unspecified 03/15/2017, 02/10/2021, 02/01/2022   Moderna SARS-COV2 Booster Vaccination 08/18/2020   Moderna Sars-Covid-2 Vaccination 06/12/2019, 07/11/2019, 02/22/2020, 02/18/2021, 02/01/2022   Pneumococcal Conjugate-13 10/30/2013   Pneumococcal Polysaccharide-23 06/10/2017   Respiratory Syncytial Virus Vaccine,Recomb Aduvanted(Arexvy) 02/05/2022   Td 10/31/2010   Td (Adult), 2 Lf Tetanus Toxid, Preservative Free 10/31/2010   Tdap 11/18/2020, 02/05/2022   Zoster Recombinant(Shingrix) 05/02/2018, 07/21/2018   Zoster, Live 10/30/2013    TDAP status: Up to date  Flu Vaccine status: Up to date  Pneumococcal vaccine status: Up to date  Covid-19 vaccine status: Information provided on how to obtain vaccines.   Qualifies for Shingles Vaccine? Yes   Zostavax completed Yes   Shingrix Completed?: Yes  Screening  Tests Health Maintenance  Topic Date Due   COVID-19 Vaccine (7 - 2024-25 season) 12/30/2022   INFLUENZA VACCINE  11/29/2023   FOOT EXAM  12/07/2023   HEMOGLOBIN A1C  03/14/2024   OPHTHALMOLOGY EXAM  06/11/2024   Diabetic kidney evaluation - Urine ACR  06/12/2024   Diabetic kidney evaluation - eGFR measurement  09/11/2024   Colonoscopy  10/28/2024   Medicare Annual Wellness (AWV)  12/15/2024   DTaP/Tdap/Td (4 - Td or Tdap) 02/06/2032   Pneumococcal Vaccine: 50+ Years  Completed   Hepatitis C Screening  Completed   Zoster Vaccines- Shingrix  Completed   HPV VACCINES  Aged Out   Meningococcal B Vaccine  Aged Out   Lung Cancer Screening  Discontinued   Pneumococcal Vaccine  Discontinued    Health Maintenance  Health Maintenance Due  Topic Date Due   COVID-19 Vaccine (7 - 2024-25 season) 12/30/2022   INFLUENZA VACCINE  11/29/2023   FOOT EXAM  12/07/2023    Colorectal cancer screening: Type of screening: Colonoscopy. Completed 10/29/14. Repeat every 10 years  Lung Cancer Screening: (Low Dose CT Chest recommended if Age 4-80 years, 20 pack-year currently smoking OR have quit w/in 15years.) does not qualify.   Lung Cancer Screening Referral: none  Additional Screening:  Hepatitis C Screening: does qualify; Completed 06/10/2017  Vision Screening: Recommended annual ophthalmology exams for early detection of glaucoma and other disorders of the eye. Is the patient up to date with their annual eye exam?  Yes  Who is the provider or what is the name of the office in which the patient attends annual eye exams? [facility] If pt is not established with a provider, would they like to be referred to a provider to establish care? N/A.   Dental Screening: Recommended annual dental exams for proper oral hygiene  Diabetic Foot Exam: Diabetic Foot Exam: Completed 12/16/23  Community Resource Referral / Chronic Care Management: CRR required this visit?  No   CCM required this  visit?  No     Plan:     I have personally reviewed and noted the following in the patient's chart:   Medical and social history Use of alcohol, tobacco or illicit drugs  Current medications and supplements including opioid prescriptions. Patient is not currently taking opioid prescriptions. Functional ability and status Nutritional status Physical activity Advanced directives List of other physicians Hospitalizations, surgeries, and ER visits in previous 12 months Vitals Screenings to include cognitive, depression, and falls Referrals and appointments  In addition, I have reviewed and discussed with patient certain preventive protocols, quality metrics, and best practice recommendations. A written personalized care plan for preventive services as well as general preventive health recommendations were provided to patient.     Laymon LOISE Metro, CMA   12/16/2023   After Visit Summary: (In Person-Printed) AVS printed and given to the patient

## 2023-12-16 NOTE — Assessment & Plan Note (Signed)
Refer to diabetes with hyperglycemia plan of care.

## 2023-12-16 NOTE — Assessment & Plan Note (Signed)
 Chronic, stable.  Continue current medication regimen and adjust as needed.  Current LDL well controlled.  Lipid panel today.

## 2023-12-16 NOTE — Assessment & Plan Note (Signed)
 Chronic, stable.  Noted on past imaging, continue statin and ASA daily for prevention.

## 2023-12-16 NOTE — Assessment & Plan Note (Signed)
Chronic, stable.  BP remains at goal for age.  Continue current medication regimen and collaboration with cardiology.  Adjust as needed.  Recommend he continue to monitor BP occasionally at home and document + focus on DASH diet.  LABS: BMP.  Return in 3 months.

## 2023-12-17 ENCOUNTER — Ambulatory Visit: Payer: Self-pay | Admitting: Nurse Practitioner

## 2023-12-17 LAB — LIPID PANEL W/O CHOL/HDL RATIO
Cholesterol, Total: 123 mg/dL (ref 100–199)
HDL: 70 mg/dL (ref >39)
LDL Chol Calc (NIH): 42 mg/dL (ref 0–99)
Triglycerides: 47 mg/dL (ref 0–149)
VLDL Cholesterol Cal: 11 mg/dL (ref 5–40)

## 2023-12-17 LAB — BASIC METABOLIC PANEL WITH GFR
BUN/Creatinine Ratio: 17 (ref 10–24)
BUN: 15 mg/dL (ref 8–27)
CO2: 24 mmol/L (ref 20–29)
Calcium: 10.2 mg/dL (ref 8.6–10.2)
Chloride: 102 mmol/L (ref 96–106)
Creatinine, Ser: 0.9 mg/dL (ref 0.76–1.27)
Glucose: 68 mg/dL — ABNORMAL LOW (ref 70–99)
Potassium: 5 mmol/L (ref 3.5–5.2)
Sodium: 139 mmol/L (ref 134–144)
eGFR: 89 mL/min/1.73 (ref >59)

## 2023-12-17 NOTE — Progress Notes (Signed)
 Contacted via MyChart  Good morning Johnny Wolfe, your labs have returned and continue to look great.  As we discussed, try holding Valtessa and we will see if potassium level remains stable without it.  Any questions? Keep being amazing!!  Thank you for allowing me to participate in your care.  I appreciate you. Kindest regards, Jeston Junkins

## 2024-01-28 ENCOUNTER — Other Ambulatory Visit (HOSPITAL_COMMUNITY): Payer: Self-pay

## 2024-01-28 ENCOUNTER — Other Ambulatory Visit: Payer: Self-pay

## 2024-02-17 DIAGNOSIS — Z872 Personal history of diseases of the skin and subcutaneous tissue: Secondary | ICD-10-CM | POA: Diagnosis not present

## 2024-02-17 DIAGNOSIS — L578 Other skin changes due to chronic exposure to nonionizing radiation: Secondary | ICD-10-CM | POA: Diagnosis not present

## 2024-02-17 DIAGNOSIS — Z859 Personal history of malignant neoplasm, unspecified: Secondary | ICD-10-CM | POA: Diagnosis not present

## 2024-02-17 DIAGNOSIS — L570 Actinic keratosis: Secondary | ICD-10-CM | POA: Diagnosis not present

## 2024-02-17 NOTE — Progress Notes (Unsigned)
 Cardiology Office Note  Date:  02/18/2024   ID:  Johnny Wolfe, DOB [birth year], MRN [id number]  PCP:  Valerio Melanie DASEN, NP   Chief Complaint  Patient presents with   12 month follow up     Patient denies any cardiac issues.     HPI:  Johnny Wolfe is a 78 yo old gentleman with past medical history of Diabetes,  Hyperkalemia on valtessa Hypertension Hyperlipidemia Smoker quit 2013 ETOH Coronary calcium  score of 2310. Cardiac cath 08/02/2017 Severe proximal RCA disease, with stent placement Moderate proximal LAD and LCX disease Mild mid LAD, RCA disease, Vessels are heavily calcified. Prior history of syncope Who presents for follow-up of his coronary disease, stent to the RCA  Last office visit with myself October 2024  Recently went to [employer], lots of walking Some leg soreness Works outside in the yard no regular exercise program  Denies chest pain or shortness of breath on exertion concerning for angina  Rare dizzy spells , occasional dehydration no significant near-syncope or syncope episodes  Travel to [place]  over the summer for family reunion  Lab work reviewed A1c 6.3 Total cholesterol 123 LDL 42 Potassium 5.0 August 2025,  off Veltassa  past several months No recent magnesium   EKG personally reviewed by myself on todays visit EKG Interpretation Date/Time:  Tuesday February 18 2024 10:23:19 EDT Ventricular Rate:  67 PR Interval:  184 QRS Duration:  90 QT Interval:  418 QTC Calculation: 441 R Axis:   89  Text Interpretation: Normal sinus rhythm Normal ECG When compared with ECG of 15-Feb-2023 10:30, No significant change was found Confirmed by Perla Lye [phone]) on 02/18/2024 10:26:20 AM    Other past medical history reviewed Echo at [facility] The left ventricle is normal in size with normal wall thickness. The left ventricular systolic function is normal, LVEF is visually estimated at > 55%. The right ventricle is normal in size, with normal  systolic function.  Recent trip to [place] , some hypersensitive to light Weight back up 10 pounds, Prior visit, had lost 20 pounds Less off orthostasis Has had med changes, off jardiance /ozympic  Fall, seen in the ER 05/12/2019  tripped on part of his rug, falling and striking the right side of his face on the floor. facial fracture requiring ORIF by Dr. Zanation with ENT at [facility].   FAll, 06/18/2019, in the ER   syncopal episode at home which resulted in an injury to his chin and jaw got up tonight and felt dizzy and is not quite certain what happened but woke up on the floor with an odd and full feeling in his right ear and some bleeding under his chin. Transferred to [facility] Has IVF 2 1/2 days  Cardiac cath 08/02/2017 Severe proximal RCA disease, with stent placement Moderate proximal LAD and LCX disease Mild mid LAD, RCA disease, Vessels are heavily calcified. Normal EF estimated at 55%  Coronary calcium  score of 2310. This was 95 percentile for age and sex matched control. high calcium  score.  severe heavy three-vessel disease  mild aortic atherosclerosis  Syncope: 05/21/2017 On [employer] to bed 7pm Woke up on the floor Remembered went to bathroom, woke up on floor, dark in room Then went to bathroom again,  Feeling for light Woke up again in the bathtub, little knot on head, Got up out of bathroom,  Little dizzy,  Sugars were 80  Father died 36s cancer Mother Obese, DM, cad, stent age 77, now age 47, now pacer Brother CAD,  PCi  PMH:   has a past medical history of Allergy, CAD (coronary artery disease), Cataract, Diabetes mellitus without complication (HCC), Essential hypertension, Hyperlipidemia, Syncope, and Wears dentures.  PSH:    Past Surgical History:  Procedure Laterality Date   APPENDECTOMY     CARDIAC CATHETERIZATION     CATARACT EXTRACTION, BILATERAL     August 2017   CORONARY STENT INTERVENTION N/A 08/02/2017   Procedure: CORONARY STENT  INTERVENTION;  Surgeon: Darron Deatrice LABOR, MD;  Location: [facility] INVASIVE CV LAB;  Service: Cardiovascular;  Laterality: N/A;   LEFT HEART CATH AND CORONARY ANGIOGRAPHY Left 08/02/2017   Procedure: LEFT HEART CATH AND CORONARY ANGIOGRAPHY;  Surgeon: Perla Evalene PARAS, MD;  Location: [facility] INVASIVE CV LAB;  Service: Cardiovascular;  Laterality: Left;   MASS EXCISION Left 07/04/2017   Procedure: REMOVAL TWO CYST EAR EAR LOBE AND EXTERNAL EAR;  Surgeon: Herminio Miu, MD;  Location: [facility];  Service: ENT;  Laterality: Left;  LOCAL Diabetic - insulin  and oral meds   MOHS SURGERY     head   upper jaw Right 06/2019   at [facility]    Current Outpatient Medications  Medication Sig Dispense Refill   aspirin  EC 81 MG tablet Take 81 mg by mouth daily.     atorvastatin  (LIPITOR) 40 MG tablet Take 1 tablet (40 mg total) by mouth daily. 90 tablet 4   Blood Glucose Monitoring Suppl (ONETOUCH VERIO FLEX SYSTEM) w/Device KIT To check blood sugar 2 to 3 times daily. 1 kit 0   glucose blood (ONETOUCH VERIO) test strip USE TO CHECK BLOOD SUGAR 2-3 TIMES PER DAY AS DIRECTED 300 each 1   insulin  glargine (LANTUS  SOLOSTAR) 100 UNIT/ML Solostar Pen INJECT SUBCUTANEOUSLY 16  UNITS DAILY 15 mL 4   Lancet Device MISC 1 Units by Does not apply route daily as needed. 100 each 11   Lancets (ONETOUCH DELICA PLUS LANCET33G) MISC USE TO CHECK BLOOD SUGAR 2-3 TIMES DAILY 300 each 1   magnesium  oxide (MAG-OX) 400 (240 Mg) MG tablet TAKE 1 TABLET BY MOUTH TWICE DAILY 180 tablet 0   metFORMIN  (GLUCOPHAGE ) 1000 MG tablet TAKE 1 TABLET BY MOUTH TWICE DAILY WITH MEALS. 180 tablet 4   metoprolol  tartrate (LOPRESSOR ) 25 MG tablet Take 0.5 tablets (12.5 mg total) by mouth 2 (two) times daily. 90 tablet 4   nitroGLYCERIN  (NITROSTAT ) 0.4 MG SL tablet Place 1 tablet (0.4 mg total) under the tongue every 5 (five) minutes as needed for chest pain. 25 tablet 2   patiromer  (VELTASSA ) 8.4 g packet TAKE THE CONTENTS OF 1 PACKET DAILY  AS DIRECTED 90 each 4   vitamin B-12 (CYANOCOBALAMIN) 500 MCG tablet Take 500 mcg by mouth daily.     No current facility-administered medications for this visit.     Allergies:   Neosporin [bacitracin -polymyxin b], Ace inhibitors, and Neosporin [neomycin -bacitracin  zn-polymyx]   Social History:  The patient  reports that he quit smoking about 12 years ago. His smoking use included cigarettes. He has a 45 pack-year smoking history. He has never used smokeless tobacco. He reports current alcohol use of about 7.0 standard drinks of alcohol per week. He reports that he does not use drugs.   Family History:   family history includes Arrhythmia (age of onset: 28) in his mother; Cancer in his father and sister; Diabetes in his mother; Kidney cancer in his sister; Lung cancer in his sister.    Review of Systems: Review of Systems  Constitutional: Negative.  HENT: Negative.    Respiratory: Negative.    Cardiovascular: Negative.   Gastrointestinal: Negative.   Musculoskeletal: Negative.   Neurological: Negative.   Psychiatric/Behavioral: Negative.    All other systems reviewed and are negative.   PHYSICAL EXAM: VS:  BP 110/60 (BP Location: Left Arm, Patient Position: Sitting, Cuff Size: Normal)   Pulse 67   Ht 6' (1.829 m)   Wt 168 lb (76.2 kg)   SpO2 93%   BMI 22.78 kg/m  , BMI Body mass index is 22.78 kg/m. Constitutional:  oriented to person, place, and time. No distress.  HENT:  Head: Normocephalic and atraumatic.  Eyes:  no discharge. No scleral icterus.  Neck: Normal range of motion. Neck supple. No JVD present.  Cardiovascular: Normal rate, regular rhythm, normal heart sounds and intact distal pulses. Exam reveals no gallop and no friction rub. No edema No murmur heard. Pulmonary/Chest: Effort normal and breath sounds normal. No stridor. No respiratory distress.  no wheezes.  no rales.  no tenderness.  Abdominal: Soft.  no distension.  no tenderness.  Musculoskeletal:  Normal range of motion.  no  tenderness or deformity.  Neurological:  normal muscle tone. Coordination normal. No atrophy Skin: Skin is warm and dry. No rash noted. not diaphoretic.  Psychiatric:  normal mood and affect. behavior is normal. Thought content normal.     Recent Labs: 03/12/2023: Hemoglobin 12.4; Platelets 264; TSH 3.730 06/13/2023: ALT 10 12/16/2023: BUN 15; Creatinine, Ser 0.90; Potassium 5.0; Sodium 139    Lipid Panel Lab Results  Component Value Date   CHOL 123 12/16/2023   HDL 70 12/16/2023   LDLCALC 42 12/16/2023   TRIG 47 12/16/2023    Wt Readings from Last 3 Encounters:  02/18/24 168 lb (76.2 kg)  12/16/23 169 lb 12.8 oz (77 kg)  09/12/23 169 lb 9.6 oz (76.9 kg)     ASSESSMENT AND PLAN:  Type 2 diabetes mellitus without complication, with long-term current use of insulin  (HCC) A1c in the 6 range, well-controlled No significant complications  Syncope and collapse -  Prior history of occasional orthostasis symptoms with standing, none recently, blood pressure low but stable Recommend he stay hydrated  CAD, Stable angina cardiac catheterization April 2019  Stent to the proximal RCA Currently with no symptoms of angina. No further workup at this time. Continue current medication regimen.  High cholesterol -  Cholesterol is at goal on the current lipid regimen. No changes to the medications were made.  Hyperkalemia avoid ACE inhibitors, ARBs Off valtessa, we will check magnesium  and potassium today   Orders Placed This Encounter  Procedures   EKG 12-Lead      Signed, Velinda Lunger, M.D., Ph.D. 02/18/2024  [facility] [place], [place] [phone]

## 2024-02-18 ENCOUNTER — Encounter: Payer: Self-pay | Admitting: Cardiovascular Disease

## 2024-02-18 ENCOUNTER — Ambulatory Visit: Attending: Cardiovascular Disease | Admitting: Cardiovascular Disease

## 2024-02-18 VITALS — BP 110/60 | HR 67 | Ht 72.0 in | Wt 168.0 lb

## 2024-02-18 DIAGNOSIS — Z794 Long term (current) use of insulin: Secondary | ICD-10-CM

## 2024-02-18 DIAGNOSIS — I25119 Atherosclerotic heart disease of native coronary artery with unspecified angina pectoris: Secondary | ICD-10-CM | POA: Diagnosis not present

## 2024-02-18 DIAGNOSIS — E1159 Type 2 diabetes mellitus with other circulatory complications: Secondary | ICD-10-CM

## 2024-02-18 DIAGNOSIS — Z79899 Other long term (current) drug therapy: Secondary | ICD-10-CM | POA: Diagnosis not present

## 2024-02-18 DIAGNOSIS — E1165 Type 2 diabetes mellitus with hyperglycemia: Secondary | ICD-10-CM | POA: Diagnosis not present

## 2024-02-18 DIAGNOSIS — I700 Atherosclerosis of aorta: Secondary | ICD-10-CM | POA: Diagnosis not present

## 2024-02-18 DIAGNOSIS — I152 Hypertension secondary to endocrine disorders: Secondary | ICD-10-CM

## 2024-02-18 DIAGNOSIS — Z87891 Personal history of nicotine dependence: Secondary | ICD-10-CM | POA: Diagnosis not present

## 2024-02-18 NOTE — Patient Instructions (Addendum)
 Medication Instructions:   No changes  If you need a refill on your cardiac medications before your next appointment, please call your pharmacy.   Lab work:  [employer] and magnesium  today  Testing/Procedures: No new testing needed  Follow-Up: At [facility], you and your health needs are our priority.  As part of our continuing mission to provide you with exceptional heart care, we have created designated Provider Care Teams.  These Care Teams include your primary Cardiologist (physician) and Advanced Practice Providers (APPs -  Physician Assistants and Nurse Practitioners) who all work together to provide you with the care you need, when you need it.  You will need a follow up appointment in 12 months  Providers on your designated Care Team:   Lonni Meager, NP Bernardino Bring, PA-C Cadence [place], [place]  COVID-19 Vaccine Information can be found at: [email or web address] For questions related to vaccine distribution or appointments, please email [email or web address] or call [phone].

## 2024-02-19 ENCOUNTER — Ambulatory Visit: Payer: Self-pay | Admitting: Cardiovascular Disease

## 2024-02-19 LAB — BASIC METABOLIC PANEL WITH GFR
BUN/Creatinine Ratio: 19 (ref 10–24)
BUN: 16 mg/dL (ref 8–27)
CO2: 24 mmol/L (ref 20–29)
Calcium: 9.8 mg/dL (ref 8.6–10.2)
Chloride: 100 mmol/L (ref 96–106)
Creatinine, Ser: 0.86 mg/dL (ref 0.76–1.27)
Glucose: 64 mg/dL — ABNORMAL LOW (ref 70–99)
Potassium: 5.4 mmol/L — ABNORMAL HIGH (ref 3.5–5.2)
Sodium: 138 mmol/L (ref 134–144)
eGFR: 90 mL/min/1.73

## 2024-02-19 LAB — MAGNESIUM: Magnesium: 1.7 mg/dL (ref 1.6–2.3)

## 2024-03-03 ENCOUNTER — Other Ambulatory Visit: Payer: Self-pay

## 2024-03-04 ENCOUNTER — Other Ambulatory Visit: Payer: Self-pay

## 2024-03-05 ENCOUNTER — Other Ambulatory Visit: Payer: Self-pay

## 2024-03-10 ENCOUNTER — Other Ambulatory Visit: Payer: Self-pay

## 2024-03-11 ENCOUNTER — Other Ambulatory Visit: Payer: Self-pay

## 2024-03-13 ENCOUNTER — Other Ambulatory Visit: Payer: Self-pay | Admitting: Medical Genetics

## 2024-03-15 NOTE — Patient Instructions (Signed)

## 2024-03-20 ENCOUNTER — Ambulatory Visit (INDEPENDENT_AMBULATORY_CARE_PROVIDER_SITE_OTHER): Admitting: Nurse Practitioner

## 2024-03-20 ENCOUNTER — Encounter: Payer: Self-pay | Admitting: Nurse Practitioner

## 2024-03-20 ENCOUNTER — Other Ambulatory Visit
Admission: RE | Admit: 2024-03-20 | Discharge: 2024-03-20 | Disposition: A | Discharge status: Home / Self Care | Payer: Self-pay | Source: Ambulatory Visit | Attending: Medical Genetics | Admitting: Medical Genetics

## 2024-03-20 VITALS — BP 136/72 | HR 62 | Temp 97.9°F | Resp 15 | Ht 72.0 in | Wt 168.4 lb

## 2024-03-20 DIAGNOSIS — E785 Hyperlipidemia, unspecified: Secondary | ICD-10-CM | POA: Diagnosis not present

## 2024-03-20 DIAGNOSIS — E1169 Type 2 diabetes mellitus with other specified complication: Secondary | ICD-10-CM | POA: Diagnosis not present

## 2024-03-20 DIAGNOSIS — Z794 Long term (current) use of insulin: Secondary | ICD-10-CM

## 2024-03-20 DIAGNOSIS — Z0000 Encounter for general adult medical examination without abnormal findings: Secondary | ICD-10-CM

## 2024-03-20 DIAGNOSIS — N400 Enlarged prostate without lower urinary tract symptoms: Secondary | ICD-10-CM | POA: Diagnosis not present

## 2024-03-20 DIAGNOSIS — E1159 Type 2 diabetes mellitus with other circulatory complications: Secondary | ICD-10-CM | POA: Diagnosis not present

## 2024-03-20 DIAGNOSIS — E1165 Type 2 diabetes mellitus with hyperglycemia: Secondary | ICD-10-CM

## 2024-03-20 DIAGNOSIS — I700 Atherosclerosis of aorta: Secondary | ICD-10-CM | POA: Diagnosis not present

## 2024-03-20 DIAGNOSIS — E875 Hyperkalemia: Secondary | ICD-10-CM | POA: Diagnosis not present

## 2024-03-20 DIAGNOSIS — I25119 Atherosclerotic heart disease of native coronary artery with unspecified angina pectoris: Secondary | ICD-10-CM | POA: Diagnosis not present

## 2024-03-20 DIAGNOSIS — I152 Hypertension secondary to endocrine disorders: Secondary | ICD-10-CM | POA: Diagnosis not present

## 2024-03-20 LAB — BAYER DCA HB A1C WAIVED: HB A1C (BAYER DCA - WAIVED): 6.2 % — ABNORMAL HIGH (ref 4.8–5.6)

## 2024-03-20 NOTE — Assessment & Plan Note (Signed)
 Chronic, stable.  Continue current medication regimen and adjust as needed.  Current LDL well controlled.  Lipid panel today.

## 2024-03-20 NOTE — Assessment & Plan Note (Signed)
 Chronic, stable with A1c 6.3% today, remaining well at goal.  Urine ALB 80 (February 2025).  Stable with Lantus  and Metformin  use, will most likely need to maintain on long term Lantus  as tolerates this well -- he is aware to reduce dosing if BS too low consistently, suspect more Type 1.5 diabetic.  Continue to monitor BS at home and a post prandial check.  Return in 3 months. - Statin on board, no ACE/ARB due to ongoing hyperkalemia, even with low dose - Eye and foot exams up to date - Vaccinations up to date

## 2024-03-20 NOTE — Assessment & Plan Note (Signed)
 Recent levels have been 4.7 to 4.9.  Did trial off for 3 months but K+ levels increased so is now back on Veltassa .  Would avoid restarting ACE/ARB due to elevations in K+ with these.

## 2024-03-20 NOTE — Assessment & Plan Note (Signed)
Refer to diabetes with hyperglycemia plan of care.

## 2024-03-20 NOTE — Assessment & Plan Note (Signed)
 Chronic, stable.  Noted on past imaging, continue statin and ASA daily for prevention.

## 2024-03-20 NOTE — Progress Notes (Signed)
 BP 136/72 (BP Location: Left Arm, Patient Position: Sitting, Cuff Size: Normal)   Pulse 62   Temp 97.9 F (36.6 C) (Oral)   Resp 15   Ht 6' (1.829 m)   Wt 168 lb 6.4 oz (76.4 kg)   SpO2 98%   BMI 22.84 kg/m    Subjective:    Patient ID: Johnny Wolfe, male    DOB: Feb 12, 1946, 78 y.o.   MRN: [id number]  HPI: Johnny Wolfe is a 78 y.o. male presenting on 03/20/2024 for comprehensive medical examination. Current medical complaints include:none  He currently lives with: wife Interim Problems from his last visit: no  DIABETES A1c 6.3% in August. Takes Metformin  1000 MG BID and Lantus  16 units. Continues B12 supplement for history of lows.  HISTORY: Tried GLP1 and SGLT2, but these were not beneficial -- he lost weight and felt increased fatigue with these medications. Glutamic acid <5.0 and C peptide 2.1 in 2021 on check -- ?Type 1 1/2 vs Type 2. Hypoglycemic episodes: a time or two, but not often Polydipsia/polyuria: no Visual disturbance: no Chest pain: no Paresthesias: no Glucose Monitoring: yes             Accucheck frequency: Daily             Fasting glucose: 100 or a little lower             Post prandial:              Evening:             Before meals: Taking Insulin ?: no             Long acting insulin : Lantus  16 units             Short acting insulin : Blood Pressure Monitoring: daily Retinal Examination: Up to Date Foot Exam: Up to Date Pneumovax: Up to Date Influenza: Up to Date Aspirin : yes   HYPERTENSION / HYPERLIPIDEMIA Continues ASA and Metoprolol  + Lipitor. Saw Dr. Gollan 02/18/24. Has history of cardiac cath with stent to proximal RCA in April 2019 -- no changes recent visit. Continues on Veltassa  for hyperkalemia. Did try for period off of this but K+ levels increased.  Has never used NTG.  History of aortic atherosclerosis noted on past imaging.   Satisfied with current treatment? yes Duration of hypertension: chronic BP monitoring frequency: not  checking BP range: not checking BP medication side effects: no Duration of hyperlipidemia: chronic Cholesterol medication side effects: no Cholesterol supplements: none Medication compliance: good compliance Aspirin : yes Recent stressors: no Recurrent headaches: no Visual changes: no Palpitations: no Dyspnea: with activity at times Chest pain: no Lower extremity edema: no Dizzy/lightheaded: no The ASCVD Risk score (Arnett DK, et al., 2019) failed to calculate for the following reasons:   The valid total cholesterol range is 130 to 320 mg/dL  Functional Status Survey: Is the patient deaf or have difficulty hearing?: No Does the patient have difficulty seeing, even when wearing glasses/contacts?: No Does the patient have difficulty concentrating, remembering, or making decisions?: No Does the patient have difficulty walking or climbing stairs?: No Does the patient have difficulty dressing or bathing?: No Does the patient have difficulty doing errands alone such as visiting a doctor's office or shopping?: No  FALL RISK:    03/20/2024   10:11 AM 12/16/2023   10:17 AM 09/12/2023   10:27 AM 03/12/2023    1:05 PM 12/07/2022    1:10 PM  Fall Risk   Falls in  the past year? 0 0 0 0 0  Number falls in past yr: 0 0 0 0 0  Injury with Fall? 0 0 0 0 0  Risk for fall due to : No Fall Risks No Fall Risks No Fall Risks No Fall Risks No Fall Risks  Follow up Falls evaluation completed Falls evaluation completed Falls evaluation completed Falls evaluation completed Falls evaluation completed   Depression Screen    03/20/2024   10:11 AM 12/16/2023   10:18 AM 09/12/2023   10:27 AM 03/12/2023    1:06 PM 12/07/2022    1:10 PM  Depression screen PHQ 2/9  Decreased Interest 0 0 0 0 0  Down, Depressed, Hopeless 0 0 0 0 0  PHQ - 2 Score 0 0 0 0 0  Altered sleeping 0 0 0 0 0  Tired, decreased energy 0 0 0 2 0  Change in appetite 0 0 0 0 0  Feeling bad or failure about yourself  0 0 0 0 0   Trouble concentrating 0 0 0 0 0  Moving slowly or fidgety/restless 0 0 0 0 0  Suicidal thoughts 0 0 0 0 0  PHQ-9 Score 0 0  0  2  0   Difficult doing work/chores  Not difficult at all Not difficult at all Somewhat difficult Not difficult at all     Data saved with a previous flowsheet row definition      03/20/2024   10:11 AM 09/12/2023   10:28 AM 03/12/2023    1:06 PM 12/07/2022    1:11 PM  GAD 7 : Generalized Anxiety Score  Nervous, Anxious, on Edge 0 0 0 0  Control/stop worrying 0 0 0 0  Worry too much - different things 0 0 0 0  Trouble relaxing 0 0 0 0  Restless 0 0 0 0  Easily annoyed or irritable 0 0 0 0  Afraid - awful might happen 0 0 0 0  Total GAD 7 Score 0 0 0 0  Anxiety Difficulty  Not difficult at all Not difficult at all Not difficult at all   Advanced Directives A voluntary discussion about advance care planning including the explanation and discussion of advance directives was extensively discussed  with the patient for 15 minutes with patient.  He does have papers scanned into chart on 11/04/2017.  Explanation about the health care proxy and Living will was reviewed and packet with forms with explanation of how to fill them out was given.  During this discussion, the patient was able to identify a health care proxy as his wife and sons and has papers scanned into chart.  Past Medical History:  Past Medical History:  Diagnosis Date   Allergy    CAD (coronary artery disease)    a. 06/2017 Cardiac CT: Ca2+ score of 2310 - 95th%'ile;  b. 07/2017 Cath/PCI: LM nl, LAD 60p, 27m, LCX 60p, OM2 40p, RCA dominant, 80p (3.5x15 Sierra DES), 48m, 30d, EF 55-65%.   Cataract    Diabetes mellitus without complication [facility])    Essential hypertension    Hyperlipidemia    Syncope    Wears dentures    full upper and lower    Surgical History:  Past Surgical History:  Procedure Laterality Date   APPENDECTOMY     CARDIAC CATHETERIZATION     CATARACT EXTRACTION, BILATERAL      August 2017   CORONARY STENT INTERVENTION N/A 08/02/2017   Procedure: CORONARY STENT INTERVENTION;  Surgeon: Darron Deatrice LABOR,  MD;  Location: [facility] INVASIVE CV [facility];  Service: Cardiovascular;  Laterality: N/A;   LEFT HEART CATH AND CORONARY ANGIOGRAPHY Left 08/02/2017   Procedure: LEFT HEART CATH AND CORONARY ANGIOGRAPHY;  Surgeon: Perla Evalene PARAS, MD;  Location: [facility] INVASIVE CV LAB;  Service: Cardiovascular;  Laterality: Left;   MASS EXCISION Left 07/04/2017   Procedure: REMOVAL TWO CYST EAR EAR LOBE AND EXTERNAL EAR;  Surgeon: Herminio Miu, MD;  Location: [facility];  Service: ENT;  Laterality: Left;  LOCAL Diabetic - insulin  and oral meds   MOHS SURGERY     head   upper jaw Right 06/2019   at [facility]    Medications:  Current Outpatient Medications on File Prior to Visit  Medication Sig   aspirin  EC 81 MG tablet Take 81 mg by mouth daily.   atorvastatin  (LIPITOR) 40 MG tablet Take 1 tablet (40 mg total) by mouth daily.   Blood Glucose Monitoring Suppl (ONETOUCH VERIO FLEX SYSTEM) w/Device KIT To check blood sugar 2 to 3 times daily.   glucose blood (ONETOUCH VERIO) test strip USE TO CHECK BLOOD SUGAR 2-3 TIMES PER DAY AS DIRECTED   insulin  glargine (LANTUS  SOLOSTAR) 100 UNIT/ML Solostar Pen INJECT SUBCUTANEOUSLY 16  UNITS DAILY   Lancet Device MISC 1 Units by Does not apply route daily as needed.   Lancets (ONETOUCH DELICA PLUS LANCET33G) MISC USE TO CHECK BLOOD SUGAR 2-3 TIMES DAILY   magnesium  oxide (MAG-OX) 400 (240 Mg) MG tablet TAKE 1 TABLET BY MOUTH TWICE DAILY   metFORMIN  (GLUCOPHAGE ) 1000 MG tablet TAKE 1 TABLET BY MOUTH TWICE DAILY WITH MEALS.   metoprolol  tartrate (LOPRESSOR ) 25 MG tablet Take 0.5 tablets (12.5 mg total) by mouth 2 (two) times daily.   nitroGLYCERIN  (NITROSTAT ) 0.4 MG SL tablet Place 1 tablet (0.4 mg total) under the tongue every 5 (five) minutes as needed for chest pain.   patiromer  (VELTASSA ) 8.4 g packet TAKE THE CONTENTS OF 1 PACKET DAILY AS  DIRECTED   vitamin B-12 (CYANOCOBALAMIN) 500 MCG tablet Take 500 mcg by mouth daily.   No current facility-administered medications on file prior to visit.    Allergies:  Allergies  Allergen Reactions   Neosporin [Bacitracin -Polymyxin B] Rash   Ace Inhibitors     Hyperkalemia   Neosporin [Neomycin -Bacitracin  Zn-Polymyx]     Red rash     Social History:  Social History   Socioeconomic History   Marital status: Married    Spouse name: Not on file   Number of children: Not on file   Years of education: Not on file   Highest education level: Associate degree: occupational, scientist, product/process development, or vocational program  Occupational History   Not on file  Tobacco Use   Smoking status: Former    Current packs/day: 0.00    Average packs/day: 1 pack/day for 45.0 years (45.0 ttl pk-yrs)    Types: Cigarettes    Quit date: 10/27/2011    Years since quitting: 12.4   Smokeless tobacco: Never  Vaping Use   Vaping status: Never Used  Substance and Sexual Activity   Alcohol use: Yes    Alcohol/week: 7.0 standard drinks of alcohol    Types: 7 Shots of liquor per week    Comment: weekly   Drug use: Never   Sexual activity: Not Currently    Birth control/protection: None  Other Topics Concern   Not on file  Social History Narrative   Not on file   Social Drivers of Health   Financial Resource Strain:  Low Risk  (12/12/2023)   Overall Financial Resource Strain (CARDIA)    Difficulty of Paying Living Expenses: Not hard at all  Food Insecurity: No Food Insecurity (12/12/2023)   Hunger Vital Sign    Worried About Running Out of Food in the Last Year: Never true    Ran Out of Food in the Last Year: Never true  Transportation Needs: No Transportation Needs (12/12/2023)   PRAPARE - Administrator, Civil Service (Medical): No    Lack of Transportation (Non-Medical): No  Physical Activity: Insufficiently Active (12/12/2023)   Exercise Vital Sign    Days of Exercise per Week: 1 day     Minutes of Exercise per Session: 30 min  Stress: No Stress Concern Present (12/12/2023)   [employer] of Occupational Health - Occupational Stress Questionnaire    Feeling of Stress: Not at all  Social Connections: Moderately Isolated (12/12/2023)   Social Connection and Isolation Panel    Frequency of Communication with Friends and Family: More than three times a week    Frequency of Social Gatherings with Friends and Family: Twice a week    Attends Religious Services: Never    Database Administrator or Organizations: No    Attends Engineer, Structural: Not on file    Marital Status: Married  Catering Manager Violence: Not At Risk (12/16/2023)   Humiliation, Afraid, Rape, and Kick questionnaire    Fear of Current or Ex-Partner: No    Emotionally Abused: No    Physically Abused: No    Sexually Abused: No   Social History   Tobacco Use  Smoking Status Former   Current packs/day: 0.00   Average packs/day: 1 pack/day for 45.0 years (45.0 ttl pk-yrs)   Types: Cigarettes   Quit date: 10/27/2011   Years since quitting: 12.4  Smokeless Tobacco Never   Social History   Substance and Sexual Activity  Alcohol Use Yes   Alcohol/week: 7.0 standard drinks of alcohol   Types: 7 Shots of liquor per week   Comment: weekly   Family History:  Family History  Problem Relation Age of Onset   Diabetes Mother    Arrhythmia Mother 5       Pacemaker implant    Cancer Father        lung   Lung cancer Sister    Cancer Sister    Kidney cancer Sister     Past medical history, surgical history, medications, allergies, family history and social history reviewed with patient today and changes made to appropriate areas of the chart.   ROS All other ROS negative except what is listed above and in the HPI.      Objective:    BP 136/72 (BP Location: Left Arm, Patient Position: Sitting, Cuff Size: Normal)   Pulse 62   Temp 97.9 F (36.6 C) (Oral)   Resp 15   Ht 6' (1.829 m)    Wt 168 lb 6.4 oz (76.4 kg)   SpO2 98%   BMI 22.84 kg/m   Wt Readings from Last 3 Encounters:  03/20/24 168 lb 6.4 oz (76.4 kg)  02/18/24 168 lb (76.2 kg)  12/16/23 169 lb 12.8 oz (77 kg)    Physical Exam Vitals and nursing note reviewed.  Constitutional:      General: He is awake. He is not in acute distress.    Appearance: He is well-developed and well-groomed. He is not ill-appearing or toxic-appearing.  HENT:     Head: Normocephalic  and atraumatic.     Right Ear: Hearing, tympanic membrane, ear canal and external ear normal. No drainage.     Left Ear: Hearing, tympanic membrane, ear canal and external ear normal. No drainage.     Nose: Nose normal.     Mouth/Throat:     Pharynx: Uvula midline.  Eyes:     General: Lids are normal.        Right eye: No discharge.        Left eye: No discharge.     Extraocular Movements: Extraocular movements intact.     Conjunctiva/sclera: Conjunctivae normal.     Pupils: Pupils are equal, round, and reactive to light.     Visual Fields: Right eye visual fields normal and left eye visual fields normal.  Neck:     Thyroid : No thyromegaly.     Vascular: No carotid bruit or JVD.     Trachea: Trachea normal.  Cardiovascular:     Rate and Rhythm: Normal rate and regular rhythm.     Heart sounds: Normal heart sounds, S1 normal and S2 normal. No murmur heard.    No gallop.  Pulmonary:     Effort: Pulmonary effort is normal. No accessory muscle usage or respiratory distress.     Breath sounds: Normal breath sounds.  Abdominal:     General: Bowel sounds are normal.     Palpations: Abdomen is soft. There is no hepatomegaly or splenomegaly.     Tenderness: There is no abdominal tenderness.  Musculoskeletal:        General: Normal range of motion.     Cervical back: Normal range of motion and neck supple.     Right lower leg: No edema.     Left lower leg: No edema.  Lymphadenopathy:     Head:     Right side of head: No submental,  submandibular, tonsillar, preauricular or posterior auricular adenopathy.     Left side of head: No submental, submandibular, tonsillar, preauricular or posterior auricular adenopathy.     Cervical: No cervical adenopathy.  Skin:    General: Skin is warm and dry.     Capillary Refill: Capillary refill takes less than 2 seconds.     Findings: No rash.  Neurological:     Mental Status: He is alert and oriented to person, place, and time.     Gait: Gait is intact.     Deep Tendon Reflexes: Reflexes are normal and symmetric.     Reflex Scores:      Brachioradialis reflexes are 2+ on the right side and 2+ on the left side.      Patellar reflexes are 2+ on the right side and 2+ on the left side. Psychiatric:        Attention and Perception: Attention normal.        Mood and Affect: Mood normal.        Speech: Speech normal.        Behavior: Behavior normal. Behavior is cooperative.        Thought Content: Thought content normal.        Cognition and Memory: Cognition normal.       12/16/2023   10:23 AM 06/07/2022   11:37 AM 03/07/2022    2:46 PM 02/23/2021    8:41 AM 03/11/2020    1:06 PM  6CIT Screen  What Year? 0 points 0 points 0 points 0 points 0 points  What month? 0 points 0 points 0 points 0 points 0 points  What time? 0 points 0 points 0 points 0 points 0 points  Count back from 20 0 points 0 points 0 points 0 points 0 points  Months in reverse 0 points 0 points 0 points 0 points 0 points  Repeat phrase 0 points 0 points 0 points 2 points 2 points  Total Score 0 points 0 points 0 points 2 points 2 points   Results for orders placed or performed in visit on 02/18/24  Basic metabolic panel with GFR   Collection Time: 02/18/24 10:47 AM  Result Value Ref Range   Glucose 64 (L) 70 - 99 mg/dL   BUN 16 8 - 27 mg/dL   Creatinine, Ser 9.13 0.76 - 1.27 mg/dL   eGFR 90 >40 fO/fpw/8.26   BUN/Creatinine Ratio 19 10 - 24   Sodium 138 134 - 144 mmol/L   Potassium 5.4 (H) 3.5 - 5.2  mmol/L   Chloride 100 96 - 106 mmol/L   CO2 24 20 - 29 mmol/L   Calcium  9.8 8.6 - 10.2 mg/dL  Magnesium    Collection Time: 02/18/24 10:47 AM  Result Value Ref Range   Magnesium  1.7 1.6 - 2.3 mg/dL      Assessment & Plan:   Problem List Items Addressed This Visit       Cardiovascular and Mediastinum   Hypertension associated with diabetes (HCC) (Chronic)   Chronic, stable.  BP remains at goal for age.  Continue current medication regimen and collaboration with cardiology.  Adjust as needed.  Recommend he continue to monitor BP occasionally at home and document + focus on DASH diet.  LABS: CBC, CMP, TSH.  Return in 3 months.       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  Continue current medication regimen and collaboration with cardiology.  Recent note reviewed.      Aortic atherosclerosis (Chronic)   Chronic, stable.  Noted on past imaging, continue statin and ASA daily for prevention.        Endocrine   Type 2 diabetes mellitus with hyperglycemia (HCC) - Primary (Chronic)   Chronic, stable with A1c 6.3% today, remaining well at goal.  Urine ALB 80 (February 2025).  Stable with Lantus  and Metformin  use, will most likely need to maintain on long term Lantus  as tolerates this well -- he is aware to reduce dosing if BS too low consistently, suspect more Type 1.5 diabetic.  Continue to monitor BS at home and a post prandial check.  Return in 3 months. - Statin on board, no ACE/ARB due to ongoing hyperkalemia, even with low dose - Eye and foot exams up to date - Vaccinations up to date      Relevant Orders   Bayer DCA Hb A1c Waived   Hyperlipidemia associated with type 2 diabetes mellitus (HCC) (Chronic)   Chronic, stable.  Continue current medication regimen and adjust as needed.  Current LDL well  controlled.  Lipid panel today.      Relevant Orders   Bayer DCA Hb A1c Waived   Comprehensive metabolic panel with GFR   Lipid Panel w/o Chol/HDL Ratio     Other   Hyperkalemia (Chronic)   Recent levels have been 4.7 to 4.9.  Did trial off for 3 months but K+ levels increased so is now back on Veltassa .  Would  avoid restarting ACE/ARB due to elevations in K+ with these.      Insulin  long-term use (HCC)   Refer to diabetes with hyperglycemia plan of care.      Relevant Orders   Bayer DCA Hb A1c Waived   Other Visit Diagnoses       Benign prostatic hyperplasia without lower urinary tract symptoms       PSA on labs today.   Relevant Orders   PSA     Encounter for annual physical exam       Annual physical today with labs and health maintenance reviewed, discussed with patient.       Discussed aspirin  prophylaxis for myocardial infarction prevention and decision was made to continue ASA  LABORATORY TESTING:  Health maintenance labs ordered today as discussed above.   The natural history of prostate cancer and ongoing controversy regarding screening and potential treatment outcomes of prostate cancer has been discussed with the patient. The meaning of a false positive PSA and a false negative PSA has been discussed. He indicates understanding of the limitations of this screening test and wishes to proceed with screening PSA testing. Is aware of guidelines to stop at 70 and wishes to continue.  IMMUNIZATIONS:   - Tdap: Tetanus vaccination status reviewed: last tetanus booster within 10 years. - Influenza: Up to date - Pneumovax: Up to date - Prevnar: Up to date - Zostavax vaccine: Up to date  SCREENING: - Colonoscopy: Up to date - over 49 Discussed with patient purpose of the colonoscopy is to detect colon cancer at curable precancerous or early stages   - AAA Screening: 03/07/2021 -- this was negative -Hearing Test: Not applicable  -Spirometry: Not applicable   PATIENT  COUNSELING:    Sexuality: Discussed sexually transmitted diseases, partner selection, use of condoms, avoidance of unintended pregnancy  and contraceptive alternatives.   Advised to avoid cigarette smoking.  I discussed with the patient that most people either abstain from alcohol or drink within safe limits (<=14/week and <=4 drinks/occasion for males, <=7/weeks and <= 3 drinks/occasion for females) and that the risk for alcohol disorders and other health effects rises proportionally with the number of drinks per week and how often a drinker exceeds daily limits.  Discussed cessation/primary prevention of drug use and availability of treatment for abuse.   Diet: Encouraged to adjust caloric intake to maintain  or achieve ideal body weight, to reduce intake of dietary saturated fat and total fat, to limit sodium intake by avoiding high sodium foods and not adding table salt, and to maintain adequate dietary potassium and calcium  preferably from fresh fruits, vegetables, and low-fat dairy products.    Stressed the importance of regular exercise  Injury prevention: Discussed safety belts, safety helmets, smoke detector, smoking near bedding or upholstery.   Dental health: Discussed importance of regular tooth brushing, flossing, and dental visits.   Follow up plan: NEXT PREVENTATIVE PHYSICAL DUE IN 1 YEAR. Return in about 3 months (around 06/20/2024) for T2DM, HTN/HLD.

## 2024-03-20 NOTE — Assessment & Plan Note (Signed)
 Chronic, stable with no recent NTG use.  Continue current medication regimen and collaboration with cardiology.  Recent note reviewed.

## 2024-03-20 NOTE — Assessment & Plan Note (Signed)
 Chronic, stable.  BP remains at goal for age.  Continue current medication regimen and collaboration with cardiology.  Adjust as needed.  Recommend he continue to monitor BP occasionally at home and document + focus on DASH diet.  LABS: CBC, CMP, TSH.  Return in 3 months.

## 2024-03-21 ENCOUNTER — Ambulatory Visit: Payer: Self-pay | Admitting: Nurse Practitioner

## 2024-03-21 DIAGNOSIS — R7989 Other specified abnormal findings of blood chemistry: Secondary | ICD-10-CM

## 2024-03-21 LAB — COMPREHENSIVE METABOLIC PANEL WITH GFR
ALT: 13 IU/L (ref 0–44)
AST: 21 IU/L (ref 0–40)
Albumin: 4.7 g/dL (ref 3.8–4.8)
Alkaline Phosphatase: 116 IU/L (ref 47–123)
BUN/Creatinine Ratio: 16 (ref 10–24)
BUN: 15 mg/dL (ref 8–27)
Bilirubin Total: 0.6 mg/dL (ref 0.0–1.2)
CO2: 20 mmol/L (ref 20–29)
Calcium: 10.6 mg/dL — ABNORMAL HIGH (ref 8.6–10.2)
Chloride: 104 mmol/L (ref 96–106)
Creatinine, Ser: 0.93 mg/dL (ref 0.76–1.27)
Globulin, Total: 2.2 g/dL (ref 1.5–4.5)
Glucose: 66 mg/dL — ABNORMAL LOW (ref 70–99)
Potassium: 5 mmol/L (ref 3.5–5.2)
Sodium: 146 mmol/L — ABNORMAL HIGH (ref 134–144)
Total Protein: 6.9 g/dL (ref 6.0–8.5)
eGFR: 85 mL/min/1.73 (ref >59)

## 2024-03-21 LAB — CBC WITH DIFFERENTIAL/PLATELET
Basophils Absolute: 0.1 x10E3/uL (ref 0.0–0.2)
Basos: 1 %
EOS (ABSOLUTE): 0.3 x10E3/uL (ref 0.0–0.4)
Eos: 3 %
Hematocrit: 41.5 % (ref 37.5–51.0)
Hemoglobin: 13.1 g/dL (ref 13.0–17.7)
Immature Grans (Abs): 0 x10E3/uL (ref 0.0–0.1)
Immature Granulocytes: 0 %
Lymphocytes Absolute: 1.5 x10E3/uL (ref 0.7–3.1)
Lymphs: 16 %
MCH: 31.9 pg (ref 26.6–33.0)
MCHC: 31.6 g/dL (ref 31.5–35.7)
MCV: 101 fL — ABNORMAL HIGH (ref 79–97)
Monocytes Absolute: 0.6 x10E3/uL (ref 0.1–0.9)
Monocytes: 6 %
Neutrophils Absolute: 7.3 x10E3/uL — ABNORMAL HIGH (ref 1.4–7.0)
Neutrophils: 74 %
Platelets: 230 x10E3/uL (ref 150–450)
RBC: 4.11 x10E6/uL — ABNORMAL LOW (ref 4.14–5.80)
RDW: 12.7 % (ref 11.6–15.4)
WBC: 9.8 x10E3/uL (ref 3.4–10.8)

## 2024-03-21 LAB — LIPID PANEL W/O CHOL/HDL RATIO
Cholesterol, Total: 131 mg/dL (ref 100–199)
HDL: 75 mg/dL (ref >39)
LDL Chol Calc (NIH): 43 mg/dL (ref 0–99)
Triglycerides: 64 mg/dL (ref 0–149)
VLDL Cholesterol Cal: 13 mg/dL (ref 5–40)

## 2024-03-21 LAB — PSA: Prostate Specific Ag, Serum: 0.4 ng/mL (ref 0.0–4.0)

## 2024-03-21 LAB — TSH: TSH: 4.64 u[IU]/mL — ABNORMAL HIGH (ref 0.450–4.500)

## 2024-03-21 NOTE — Progress Notes (Signed)
 Contacted via MyChart -- needs lab only visit in 4 weeks please Good evening Johnny Wolfe, your labs have returned: - Kidney function, creatinine and eGFR, remains normal, as is liver function, AST and ALT. Sodium level a little elevated, ensure to reduce salt intake and drink a little more water. Potassium level is back to normal. - CBC overall stable. - Thyroid , TSH, level a little elevated. I would like to recheck this outpatient in 4 weeks to ensure if does not stay this way or trend up. - Remainder of labs stable. Any questions? Keep being amazing!!  Thank you for allowing me to participate in your care.  I appreciate you. Kindest regards, Avik Leoni

## 2024-04-04 LAB — GENECONNECT MOLECULAR SCREEN: Genetic Analysis Overall Interpretation: NEGATIVE

## 2024-04-22 ENCOUNTER — Other Ambulatory Visit: Payer: Self-pay

## 2024-04-22 ENCOUNTER — Other Ambulatory Visit (HOSPITAL_COMMUNITY): Payer: Self-pay

## 2024-06-22 ENCOUNTER — Ambulatory Visit: Admitting: Nurse Practitioner
